# Patient Record
Sex: Female | Born: 1955
Health system: Southern US, Community
[De-identification: ages and names within clinical notes are randomized; demographics above are authoritative.]

## PROBLEM LIST (undated history)

## (undated) DIAGNOSIS — F329 Major depressive disorder, single episode, unspecified: Secondary | ICD-10-CM

## (undated) DIAGNOSIS — Z8619 Personal history of other infectious and parasitic diseases: Secondary | ICD-10-CM

## (undated) DIAGNOSIS — K219 Gastro-esophageal reflux disease without esophagitis: Secondary | ICD-10-CM

## (undated) DIAGNOSIS — B998 Other infectious disease: Secondary | ICD-10-CM

## (undated) DIAGNOSIS — F32A Depression, unspecified: Secondary | ICD-10-CM

## (undated) DIAGNOSIS — F419 Anxiety disorder, unspecified: Secondary | ICD-10-CM

## (undated) DIAGNOSIS — D649 Anemia, unspecified: Secondary | ICD-10-CM

## (undated) DIAGNOSIS — M1990 Unspecified osteoarthritis, unspecified site: Secondary | ICD-10-CM

## (undated) DIAGNOSIS — Z972 Presence of dental prosthetic device (complete) (partial): Secondary | ICD-10-CM

## (undated) DIAGNOSIS — K5090 Crohn's disease, unspecified, without complications: Secondary | ICD-10-CM

## (undated) DIAGNOSIS — I2510 Atherosclerotic heart disease of native coronary artery without angina pectoris: Secondary | ICD-10-CM

## (undated) DIAGNOSIS — I10 Essential (primary) hypertension: Secondary | ICD-10-CM

## (undated) DIAGNOSIS — T8859XA Other complications of anesthesia, initial encounter: Secondary | ICD-10-CM

## (undated) DIAGNOSIS — T4145XA Adverse effect of unspecified anesthetic, initial encounter: Secondary | ICD-10-CM

## (undated) HISTORY — DX: Anxiety disorder, unspecified: F41.9

## (undated) HISTORY — DX: Personal history of other infectious and parasitic diseases: Z86.19

## (undated) HISTORY — DX: Depression, unspecified: F32.A

## (undated) HISTORY — DX: Other infectious disease: B99.8

## (undated) HISTORY — DX: Major depressive disorder, single episode, unspecified: F32.9

---

## 1984-11-18 HISTORY — PX: TUBAL LIGATION: SHX77

## 2001-11-28 ENCOUNTER — Encounter: Payer: Self-pay | Admitting: Emergency Medicine

## 2001-11-28 ENCOUNTER — Emergency Department (HOSPITAL_COMMUNITY): Admission: EM | Admit: 2001-11-28 | Discharge: 2001-11-28 | Payer: Self-pay | Admitting: Emergency Medicine

## 2013-02-16 HISTORY — PX: COLONOSCOPY: SHX174

## 2013-05-03 LAB — HM COLONOSCOPY

## 2013-07-28 HISTORY — PX: OTHER SURGICAL HISTORY: SHX169

## 2013-08-12 DIAGNOSIS — D069 Carcinoma in situ of cervix, unspecified: Secondary | ICD-10-CM | POA: Insufficient documentation

## 2013-08-12 LAB — HM PAP SMEAR: HM PAP: POSITIVE

## 2013-10-22 ENCOUNTER — Ambulatory Visit: Payer: Self-pay | Admitting: Family Medicine

## 2014-01-03 ENCOUNTER — Ambulatory Visit: Payer: Self-pay | Admitting: Obstetrics and Gynecology

## 2014-01-03 LAB — COMPREHENSIVE METABOLIC PANEL
Albumin: 3.2 g/dL — ABNORMAL LOW (ref 3.4–5.0)
Alkaline Phosphatase: 91 U/L
Anion Gap: 1 — ABNORMAL LOW (ref 7–16)
BUN: 10 mg/dL (ref 7–18)
Bilirubin,Total: 0.3 mg/dL (ref 0.2–1.0)
Calcium, Total: 9.2 mg/dL (ref 8.5–10.1)
Chloride: 108 mmol/L — ABNORMAL HIGH (ref 98–107)
Co2: 32 mmol/L (ref 21–32)
Creatinine: 0.93 mg/dL (ref 0.60–1.30)
EGFR (African American): >60
EGFR (Non-African Amer.): >60
Glucose: 82 mg/dL (ref 65–99)
Osmolality: 279 (ref 275–301)
Potassium: 4 mmol/L (ref 3.5–5.1)
SGOT(AST): 30 U/L (ref 15–37)
SGPT (ALT): 18 U/L (ref 12–78)
Sodium: 141 mmol/L (ref 136–145)
Total Protein: 7.1 g/dL (ref 6.4–8.2)

## 2014-01-04 ENCOUNTER — Ambulatory Visit: Payer: Self-pay | Admitting: Obstetrics and Gynecology

## 2014-01-05 LAB — PATHOLOGY REPORT

## 2014-02-16 HISTORY — PX: UPPER GI ENDOSCOPY: SHX6162

## 2014-05-31 LAB — HEPATIC FUNCTION PANEL
ALT: 10 U/L (ref 7–35)
AST: 15 U/L (ref 13–35)

## 2014-05-31 LAB — CBC AND DIFFERENTIAL
HEMATOCRIT: 32 % — AB (ref 36–46)
HEMOGLOBIN: 10.6 g/dL — AB (ref 12.0–16.0)
Platelets: 295 10*3/uL (ref 150–399)
WBC: 6.7 10^3/mL

## 2015-02-02 LAB — BASIC METABOLIC PANEL
BUN: 12 mg/dL (ref 4–21)
Creatinine: 0.8 mg/dL (ref 0.5–1.1)
GLUCOSE: 95 mg/dL
POTASSIUM: 4 mmol/L (ref 3.4–5.3)
SODIUM: 142 mmol/L (ref 137–147)

## 2015-03-11 NOTE — Op Note (Signed)
PATIENT NAME:  Kimberly Hudson, NICKLIN MR#:  [id number] DATE OF BIRTH:  1956/01/02  DATE OF PROCEDURE:  01/04/2014  PREOPERATIVE DIAGNOSIS: Cervical intraepithelial neoplasia grade III.    POSTOPERATIVE DIAGNOSIS: Cervical intraepithelial neoplasia grade III.    OPERATION PERFORMED: LEEP.   ANESTHESIA USED: General.   PRIMARY SURGEON: Dr. Malachy Mood.   ESTIMATED BLOOD LOSS: 15 mL.   OPERATIVE FLUIDS: 850 mL.   URINE OUTPUT: 200 mL of clear urine.   PREOPERATIVE ANTIBIOTICS: None.   DRAINS OR TUBES: None.   IMPLANTS: None.   COMPLICATIONS: None.   SPECIMENS REMOVED: Loop electrocautery excision procedure, ectocervical biopsy, endocervical biopsy and endocervical curettings.   INTRAOPERATIVE FINDINGS: Grossly normal-appearing cervix.   PROCEDURE IN DETAIL: Risks, benefits and alternatives of the procedure were discussed with the patient prior to proceeding to the operating room. The patient was taken to the operating room where she was placed under general anesthesia. The patient was positioned in the dorsal lithotomy position using Allen stirrups, prepped and draped in the usual sterile fashion. A timeout procedure was performed. The patient was straight cath'd for 200 mL of clear urine. An insulated speculum was then placed, visualizing the cervix. Lugol solution was applied to the cervix to demarcate the transformation zone. The cervix was then injected using lidocaine with epinephrine. A loop was then used to remove the ectocervical portion of the LEEP specimen. Following the initial ectocervical specimen, a top hat specimen was obtained using a smaller loop. An ECC was then obtained using the endocervical curette for the top margin of the specimen. Following this, the borders of the LEEP bed were cauterized using a rollerball. Monsel solution was applied. Sponge, needle and instrument counts were correct x 2. The patient tolerated the procedure well and was taken to the recovery room in  stable condition.    ____________________________ Stoney Bang. Georgianne Fick, MD ams:dmm D: 01/04/2014 20:41:21 ET T: 01/04/2014 20:48:58 ET JOB#: [id number]  cc: Stoney Bang. Georgianne Fick, MD, <Dictator> Conan Bowens Madelon Lips MD ELECTRONICALLY SIGNED 01/05/2014 1:01

## 2015-03-21 ENCOUNTER — Other Ambulatory Visit: Payer: Self-pay | Admitting: Urgent Care

## 2015-03-21 DIAGNOSIS — K50918 Crohn's disease, unspecified, with other complication: Secondary | ICD-10-CM

## 2015-03-24 ENCOUNTER — Ambulatory Visit: Admission: RE | Admit: 2015-03-24 | Payer: Self-pay | Source: Ambulatory Visit

## 2015-04-20 ENCOUNTER — Telehealth: Payer: Self-pay | Admitting: *Deleted

## 2015-04-20 NOTE — Telephone Encounter (Signed)
Refill request for Hydro-Ace Last filled by MD on- 04/07/15 #60 x0 refills Last Appt: 04/07/2015 Next Appt: none Please advise refill?

## 2015-04-21 MED ORDER — HYDROCODONE-ACETAMINOPHEN 7.5-325 MG PO TABS: 1.0000 | ORAL_TABLET | Freq: Four times a day (QID) | ORAL | Status: DC | PRN

## 2015-04-21 NOTE — Telephone Encounter (Signed)
Pt called to see if RX was ready. Thanks TNP

## 2015-04-24 ENCOUNTER — Other Ambulatory Visit: Payer: Self-pay | Admitting: Family Medicine

## 2015-04-24 MED ORDER — HYDROCODONE-ACETAMINOPHEN 7.5-325 MG PO TABS: 1.0000 | ORAL_TABLET | Freq: Four times a day (QID) | ORAL | Status: DC | PRN

## 2015-04-24 NOTE — Progress Notes (Signed)
Reprinted rx due to error in address and quantity on previous rx.

## 2015-04-27 ENCOUNTER — Encounter: Payer: Self-pay | Admitting: Emergency Medicine

## 2015-04-27 ENCOUNTER — Emergency Department
Admission: EM | Admit: 2015-04-27 | Discharge: 2015-04-27 | Disposition: A | Discharge status: Home / Self Care | Payer: No Typology Code available for payment source | Attending: Emergency Medicine | Admitting: Emergency Medicine

## 2015-04-27 DIAGNOSIS — Y9289 Other specified places as the place of occurrence of the external cause: Secondary | ICD-10-CM | POA: Insufficient documentation

## 2015-04-27 DIAGNOSIS — Y9389 Activity, other specified: Secondary | ICD-10-CM | POA: Diagnosis not present

## 2015-04-27 DIAGNOSIS — Y998 Other external cause status: Secondary | ICD-10-CM | POA: Insufficient documentation

## 2015-04-27 DIAGNOSIS — Z87891 Personal history of nicotine dependence: Secondary | ICD-10-CM | POA: Insufficient documentation

## 2015-04-27 DIAGNOSIS — X58XXXA Exposure to other specified factors, initial encounter: Secondary | ICD-10-CM | POA: Insufficient documentation

## 2015-04-27 DIAGNOSIS — I10 Essential (primary) hypertension: Secondary | ICD-10-CM | POA: Insufficient documentation

## 2015-04-27 DIAGNOSIS — T18128A Food in esophagus causing other injury, initial encounter: Secondary | ICD-10-CM | POA: Insufficient documentation

## 2015-04-27 DIAGNOSIS — Z79899 Other long term (current) drug therapy: Secondary | ICD-10-CM | POA: Insufficient documentation

## 2015-04-27 DIAGNOSIS — T18108A Unspecified foreign body in esophagus causing other injury, initial encounter: Secondary | ICD-10-CM

## 2015-04-27 HISTORY — DX: Essential (primary) hypertension: I10

## 2015-04-27 HISTORY — DX: Crohn's disease, unspecified, without complications: K50.90

## 2015-04-27 HISTORY — DX: Atherosclerotic heart disease of native coronary artery without angina pectoris: I25.10

## 2015-04-27 NOTE — ED Provider Notes (Signed)
[facility] Emergency Department Provider Note   ____________________________________________  Time seen: 10:40 PM I have reviewed the triage vital signs and the triage nursing note.  HISTORY  Chief Complaint Aspiration   Historian Patient and daughter  HPI Kimberly Hudson is a 59 y.o. female who has a history of esophageal stretching in the past and tonight she had swallowed a piece of sausage and it got stuck in her esophagus. She was having pain at that point in time. EMS did give her glucagon and then the patient was able to finally swallow again on arrival to the emergency department. Symptoms are consistent with moderate but now alleviated. She has followed up once with the PA at Dr.Wohl's office.    Past Medical History  Diagnosis Date  . Crohn's disease   . Hypertension   . Coronary artery disease     There are no active problems to display for this patient.   History reviewed. No pertinent past surgical history.  Current Outpatient Rx  Name  Route  Sig  Dispense  Refill  . azaTHIOprine (IMURAN) 50 MG tablet   Oral   Take 3 mg by mouth daily.         Marland Kitchen HYDROcodone-acetaminophen (NORCO) 7.5-325 MG per tablet   Oral   Take 1 tablet by mouth every 6 (six) hours as needed for moderate pain.   60 tablet   0   . lisinopril-hydrochlorothiazide (PRINZIDE,ZESTORETIC) 20-12.5 MG per tablet   Oral   Take 1 tablet by mouth daily.         . pantoprazole (PROTONIX) 40 MG tablet   Oral   Take 40 mg by mouth daily.         . sertraline (ZOLOFT) 100 MG tablet   Oral   Take 100 mg by mouth daily.           Allergies Sulfa antibiotics  History reviewed. No pertinent family history.  Social History History  Substance Use Topics  . Smoking status: Former Research scientist (life sciences)  . Smokeless tobacco: Not on file  . Alcohol Use: Yes     Comment: every once in a while    Review of Systems  Constitutional:  Eyes: Negative for visual  changes. ENT: Negative for sore throat. Cardiovascular: Negative for chest pain. Respiratory: Negative for shortness of breath. Gastrointestinal: Negative for abdominal pain, vomiting and diarrhea. Genitourinary:no urinary problems Musculoskeletal:negative for extremity pain. Skin: Negative for rash. Neurological:negative for alteredmental status   ____________________________________________   PHYSICAL EXAM:  VITAL SIGNS: ED Triage Vitals  Enc Vitals Group     BP 04/27/15 2137 118/49 mmHg     Pulse Rate 04/27/15 2137 66     Resp --      Temp 04/27/15 2137 98.3 F (36.8 C)     Temp Source 04/27/15 2137 Oral     SpO2 04/27/15 2137 97 %     Weight 04/27/15 2137 192 lb 4 oz (87.204 kg)     Height 04/27/15 2137 5' 7"  (1.702 m)     Head Cir --      Peak Flow --      Pain Score --      Pain Loc --      Pain Edu? --      Excl. in [place]? --     Constitutional: Alert and oriented. Well appearing and in no distress. Eyes: Conjunctivae are normal. PERRL. Normal extraocular movements. ENT   Head: Normocephalic and atraumatic.   Nose: No congestion/rhinnorhea.  Mouth/Throat: Mucous membranes are moist.   Neck: No stridor. Cardiovascular: Normal rate, regular rhythm.  No murmurs, rubs, or gallops. Respiratory: Normal respiratory effort without tachypnea nor retractions. Breath sounds are clear and equal bilaterally. No wheezes/rales/rhonchi. Gastrointestinal: Soft. No distention, no guarding, no rebound.nontender Genitourinary/rectal:deferred Musculoskeletal: nontender movement in 4 extremities Neurologic:  Normal speech and language. No gross focal neurologic deficits are appreciated. Skin:  Skin is warm, dry and intact. No rash noted. Psychiatric: Mood and affect are normal. Speech and behavior are normal. Patient exhibits appropriate insight and judgment.  ____________________________________________    EKG  none ____________________________________________  LABS (pertinent positives/negatives)  none  ____________________________________________  RADIOLOGY Radiologist results reviewed  none __________________________________________  PROCEDURES  Procedure(s) performed: None Critical Care performed: None  ____________________________________________   ED COURSE / ASSESSMENT AND PLAN  Pertinent labs & imaging results that were available during my care of the patient were reviewed by me and considered in my medical decision making (see chart for details).   Patient's esophageal foreign body is now alleviated and it passed after glucagon given prehospital.   ___________________________________________   FINAL CLINICAL IMPRESSION(S) / ED DIAGNOSES   Final diagnoses:  Esophageal foreign body, initial encounter      Lisa Roca, MD 04/27/15 2249

## 2015-04-27 NOTE — ED Notes (Signed)
Pt arrived via EMS from home. Pt had difficulties swallowing "sausage dog". Pt has hx of getting her esophagus stretched. EMS gave glucagon and 12 min later patient was able to swallow food. Pt alert and oriented on arrival.

## 2015-04-27 NOTE — ED Notes (Signed)
Done at 2145

## 2015-04-27 NOTE — Discharge Instructions (Signed)
You were evaluated after having food stuck in your esophagus which did go down after the glucagon given by EMS. Return to the emergency room for any new or worsening condition including pain, trouble breathing, or additional food stuck in esophagus.

## 2015-05-05 ENCOUNTER — Other Ambulatory Visit: Payer: Self-pay | Admitting: *Deleted

## 2015-05-05 MED ORDER — HYDROCODONE-ACETAMINOPHEN 7.5-325 MG PO TABS: 1.0000 | ORAL_TABLET | Freq: Four times a day (QID) | ORAL | Status: DC | PRN

## 2015-05-05 NOTE — Telephone Encounter (Signed)
Refill request for Hydrocondon-Ace 7.5/325 mg Last filled by MD on- 04/24/2015 #60 x0 Last Appt: 04/07/2015 Next Appt: none Please advise refill?

## 2015-05-24 ENCOUNTER — Other Ambulatory Visit: Payer: Self-pay | Admitting: *Deleted

## 2015-05-24 MED ORDER — HYDROCODONE-ACETAMINOPHEN 7.5-325 MG PO TABS: 1.0000 | ORAL_TABLET | Freq: Four times a day (QID) | ORAL | Status: DC | PRN

## 2015-05-29 ENCOUNTER — Telehealth: Payer: Self-pay | Admitting: Urgent Care

## 2015-05-29 NOTE — Telephone Encounter (Signed)
Patient called in and states in she was a no show for her small bowel follow through in May. She is now having problems choking and wants to make an appointment to see the doctor.

## 2015-05-31 ENCOUNTER — Telehealth: Payer: Self-pay | Admitting: Gastroenterology

## 2015-05-31 NOTE — Telephone Encounter (Signed)
I have called pt to reschedule appt on 06/13/15 with Dr Allen Norris. Dr Allen Norris will be doing procedures in [place] that day. Left a VM. Will try back.

## 2015-06-08 ENCOUNTER — Other Ambulatory Visit: Payer: Self-pay | Admitting: *Deleted

## 2015-06-08 MED ORDER — HYDROCODONE-ACETAMINOPHEN 7.5-325 MG PO TABS: 1.0000 | ORAL_TABLET | Freq: Four times a day (QID) | ORAL | Status: DC | PRN

## 2015-06-13 ENCOUNTER — Ambulatory Visit: Payer: PRIVATE HEALTH INSURANCE | Admitting: Gastroenterology

## 2015-06-19 ENCOUNTER — Other Ambulatory Visit: Payer: Self-pay

## 2015-06-19 DIAGNOSIS — K5090 Crohn's disease, unspecified, without complications: Secondary | ICD-10-CM | POA: Insufficient documentation

## 2015-06-19 DIAGNOSIS — D849 Immunodeficiency, unspecified: Secondary | ICD-10-CM

## 2015-06-19 DIAGNOSIS — B998 Other infectious disease: Secondary | ICD-10-CM | POA: Insufficient documentation

## 2015-06-19 HISTORY — DX: Immunodeficiency, unspecified: D84.9

## 2015-06-19 HISTORY — DX: Other infectious disease: B99.8

## 2015-06-20 ENCOUNTER — Ambulatory Visit (INDEPENDENT_AMBULATORY_CARE_PROVIDER_SITE_OTHER): Payer: PRIVATE HEALTH INSURANCE | Admitting: Gastroenterology

## 2015-06-20 ENCOUNTER — Encounter: Payer: Self-pay | Admitting: Gastroenterology

## 2015-06-20 VITALS — BP 107/68 | HR 77 | Temp 98.6°F | Ht 67.0 in | Wt 185.0 lb

## 2015-06-20 DIAGNOSIS — R1314 Dysphagia, pharyngoesophageal phase: Secondary | ICD-10-CM | POA: Diagnosis not present

## 2015-06-20 NOTE — Progress Notes (Signed)
Primary Care Physician: Lelon Huh, MD  Primary Gastroenterologist:  Dr. Lucilla Lame  Chief Complaint  Patient presents with  . Follow-up    Having difficulty swallowing    HPI: Kimberly Hudson is a 59 y.o. female here female here follow-up of dysphagia. This patient comes today with a report of dysphagia. The patient was seen some time ago by minors practitioner who had set her up for imaging but the patient never went for it. The patient has had endoscopies the past with dilation of her esophagus for narrowing. She says she has had multiple EGD for this. Now she reports that she has episodes of food getting stuck in her esophagus. One time in the recent past she actually had last so long that did not pass until she got to the emergency room.  Current Outpatient Prescriptions  Medication Sig Dispense Refill  . azaTHIOprine (IMURAN) 50 MG tablet Take 3 mg by mouth daily.    Marland Kitchen HYDROcodone-acetaminophen (NORCO) 7.5-325 MG per tablet Take 1 tablet by mouth every 6 (six) hours as needed for moderate pain. 60 tablet 0  . lisinopril-hydrochlorothiazide (PRINZIDE,ZESTORETIC) 20-12.5 MG per tablet Take 1 tablet by mouth daily.    . pantoprazole (PROTONIX) 40 MG tablet Take 40 mg by mouth daily.    . pravastatin (PRAVACHOL) 10 MG tablet Take by mouth.    . sertraline (ZOLOFT) 100 MG tablet Take 100 mg by mouth daily.     No current facility-administered medications for this visit.    Allergies as of 06/20/2015 - Review Complete 06/20/2015  Allergen Reaction Noted  . Nsaids Other (See Comments) 06/19/2015  . Sulfa antibiotics Rash 04/27/2015    ROS:  General: Negative for anorexia, weight loss, fever, chills, fatigue, weakness. ENT: Negative for hoarseness, difficulty swallowing , nasal congestion. CV: Negative for chest pain, angina, palpitations, dyspnea on exertion, peripheral edema.  Respiratory: Negative for dyspnea at rest, dyspnea on exertion, cough, sputum, wheezing.  GI: See  history of present illness. GU:  Negative for dysuria, hematuria, urinary incontinence, urinary frequency, nocturnal urination.  Endo: Negative for unusual weight change.    Physical Examination:   BP 107/68 mmHg  Pulse 77  Temp(Src) 98.6 F (37 C) (Oral)  Ht 5' 7"  (1.702 m)  Wt 185 lb (83.915 kg)  BMI 28.97 kg/m2  General: Well-nourished, well-developed in no acute distress.  Eyes: No icterus. Conjunctivae pink. Mouth: Oropharyngeal mucosa moist and pink , no lesions erythema or exudate. Lungs: Clear to auscultation bilaterally. Non-labored. Heart: Regular rate and rhythm, no murmurs rubs or gallops.  Abdomen: Bowel sounds are normal, nontender, nondistended, no hepatosplenomegaly or masses, no abdominal bruits or hernia , no rebound or guarding.   Extremities: No lower extremity edema. No clubbing or deformities. Neuro: Alert and oriented x 3.  Grossly intact. Skin: Warm and dry, no jaundice.   Psych: Alert and cooperative, normal mood and affect.  Labs:    Imaging Studies: No results found.  Assessment and Plan:   Kimberly Hudson is a 59 y.o. y/o female who comes today with a history of recurrent dysphagia and has had dilation of her esophagus multiple times in the past. The patient will be set up for an EGD to evaluate her esophagus for possible stricture and for possible dilation.I have discussed risks & benefits which include, but are not limited to, bleeding, infection, perforation & drug reaction. The patient agrees with this plan & written consent will be obtained.     Note: This dictation was prepared  with Dragon dictation along with smaller phrase technology. Any transcriptional errors that result from this process are unintentional.

## 2015-06-21 ENCOUNTER — Encounter: Payer: Self-pay | Admitting: *Deleted

## 2015-06-21 ENCOUNTER — Other Ambulatory Visit: Payer: Self-pay | Admitting: *Deleted

## 2015-06-22 MED ORDER — HYDROCODONE-ACETAMINOPHEN 7.5-325 MG PO TABS: 1.0000 | ORAL_TABLET | Freq: Four times a day (QID) | ORAL | Status: DC | PRN

## 2015-06-26 NOTE — Discharge Instructions (Signed)
General Anesthesia, Care After Refer to this sheet in the next few weeks. These instructions provide you with information on caring for yourself after your procedure. Your health care provider may also give you more specific instructions. Your treatment has been planned according to current medical practices, but problems sometimes occur. Call your health care provider if you have any problems or questions after your procedure. WHAT TO EXPECT AFTER THE PROCEDURE After the procedure, it is typical to experience:  Sleepiness.  Nausea and vomiting. HOME CARE INSTRUCTIONS  For the first 24 hours after general anesthesia:  Have a responsible person with you.  Do not drive a car. If you are alone, do not take public transportation.  Do not drink alcohol.  Do not take medicine that has not been prescribed by your health care provider.  Do not sign important papers or make important decisions.  You may resume a normal diet and activities as directed by your health care provider.  Change bandages (dressings) as directed.  If you have questions or problems that seem related to general anesthesia, call the hospital and ask for the anesthetist or anesthesiologist on call. SEEK MEDICAL CARE IF:  You have nausea and vomiting that continue the day after anesthesia.  You develop a rash. SEEK IMMEDIATE MEDICAL CARE IF:   You have difficulty breathing.  You have chest pain.  You have any allergic problems. Document Released: 02/10/2001 Document Revised: 11/09/2013 Document Reviewed: 05/20/2013 [facility] Patient Information 2015 [place], [place]. This information is not intended to replace advice given to you by your health care provider. Make sure you discuss any questions you have with your health care provider.

## 2015-06-27 ENCOUNTER — Other Ambulatory Visit: Payer: Self-pay | Admitting: *Deleted

## 2015-06-28 ENCOUNTER — Ambulatory Visit
Admission: RE | Admit: 2015-06-28 | Discharge: 2015-06-28 | Disposition: A | Discharge status: Home / Self Care | Payer: No Typology Code available for payment source | Source: Ambulatory Visit | Attending: Gastroenterology | Admitting: Gastroenterology

## 2015-06-28 ENCOUNTER — Other Ambulatory Visit: Payer: Self-pay | Admitting: Gastroenterology

## 2015-06-28 ENCOUNTER — Ambulatory Visit: Payer: No Typology Code available for payment source | Admitting: Anesthesiology

## 2015-06-28 ENCOUNTER — Encounter
Admission: RE | Disposition: A | Discharge status: Home / Self Care | Payer: Self-pay | Source: Ambulatory Visit | Attending: Gastroenterology

## 2015-06-28 DIAGNOSIS — R1310 Dysphagia, unspecified: Secondary | ICD-10-CM | POA: Diagnosis not present

## 2015-06-28 DIAGNOSIS — Z886 Allergy status to analgesic agent status: Secondary | ICD-10-CM | POA: Insufficient documentation

## 2015-06-28 DIAGNOSIS — M170 Bilateral primary osteoarthritis of knee: Secondary | ICD-10-CM | POA: Diagnosis not present

## 2015-06-28 DIAGNOSIS — I10 Essential (primary) hypertension: Secondary | ICD-10-CM | POA: Diagnosis not present

## 2015-06-28 DIAGNOSIS — Z79899 Other long term (current) drug therapy: Secondary | ICD-10-CM | POA: Insufficient documentation

## 2015-06-28 DIAGNOSIS — I2510 Atherosclerotic heart disease of native coronary artery without angina pectoris: Secondary | ICD-10-CM | POA: Diagnosis not present

## 2015-06-28 DIAGNOSIS — Z882 Allergy status to sulfonamides status: Secondary | ICD-10-CM | POA: Insufficient documentation

## 2015-06-28 DIAGNOSIS — M19011 Primary osteoarthritis, right shoulder: Secondary | ICD-10-CM | POA: Diagnosis not present

## 2015-06-28 DIAGNOSIS — M19012 Primary osteoarthritis, left shoulder: Secondary | ICD-10-CM | POA: Insufficient documentation

## 2015-06-28 DIAGNOSIS — B3781 Candidal esophagitis: Secondary | ICD-10-CM | POA: Insufficient documentation

## 2015-06-28 DIAGNOSIS — M160 Bilateral primary osteoarthritis of hip: Secondary | ICD-10-CM | POA: Diagnosis not present

## 2015-06-28 DIAGNOSIS — Z87891 Personal history of nicotine dependence: Secondary | ICD-10-CM | POA: Diagnosis not present

## 2015-06-28 DIAGNOSIS — K2970 Gastritis, unspecified, without bleeding: Secondary | ICD-10-CM | POA: Diagnosis not present

## 2015-06-28 DIAGNOSIS — K222 Esophageal obstruction: Secondary | ICD-10-CM | POA: Insufficient documentation

## 2015-06-28 HISTORY — DX: Other complications of anesthesia, initial encounter: T88.59XA

## 2015-06-28 HISTORY — DX: Unspecified osteoarthritis, unspecified site: M19.90

## 2015-06-28 HISTORY — DX: Anemia, unspecified: D64.9

## 2015-06-28 HISTORY — PX: ESOPHAGOGASTRODUODENOSCOPY (EGD) WITH PROPOFOL: SHX5813

## 2015-06-28 HISTORY — DX: Presence of dental prosthetic device (complete) (partial): Z97.2

## 2015-06-28 HISTORY — DX: Adverse effect of unspecified anesthetic, initial encounter: T41.45XA

## 2015-06-28 SURGERY — ESOPHAGOGASTRODUODENOSCOPY (EGD) WITH PROPOFOL
Anesthesia: Monitor Anesthesia Care | Wound class: Clean Contaminated

## 2015-06-28 MED ORDER — LACTATED RINGERS IV SOLN
INTRAVENOUS | Status: DC | PRN
  Administered 2015-06-28: 08:00:00 via INTRAVENOUS

## 2015-06-28 MED ORDER — PROMETHAZINE HCL 25 MG PO TABS: 25.0000 mg | ORAL_TABLET | Freq: Four times a day (QID) | ORAL | Status: DC | PRN

## 2015-06-28 MED ORDER — LIDOCAINE HCL (CARDIAC) 20 MG/ML IV SOLN
INTRAVENOUS | Status: DC | PRN
Start: 2015-06-28 — End: 2015-06-28
  Administered 2015-06-28: 30 mg via INTRAVENOUS

## 2015-06-28 MED ORDER — SODIUM CHLORIDE 0.9 % IV SOLN: INTRAVENOUS | Status: DC

## 2015-06-28 MED ORDER — ONDANSETRON HCL 4 MG PO TABS
4.0000 mg | ORAL_TABLET | Freq: Once | ORAL | Status: AC
  Administered 2015-06-28: 4 mg via ORAL

## 2015-06-28 MED ORDER — PROPOFOL 10 MG/ML IV BOLUS
INTRAVENOUS | Status: DC | PRN
  Administered 2015-06-28 (×5): 20 mg via INTRAVENOUS

## 2015-06-28 SURGICAL SUPPLY — 39 items
BALLN DILATOR 10-12 8 (BALLOONS) ×1
BALLN DILATOR 12-15 8 (BALLOONS)
BALLN DILATOR 15-18 8 (BALLOONS)
BALLN DILATOR CRE 0-12 8 (BALLOONS) ×1
BALLN DILATOR ESOPH 8 10 CRE (MISCELLANEOUS) IMPLANT
BALLOON DILATOR 12-15 8 (BALLOONS) IMPLANT
BALLOON DILATOR 15-18 8 (BALLOONS) IMPLANT
BALLOON DILATOR CRE 0-12 8 (BALLOONS) IMPLANT
BLOCK BITE 60FR ADLT L/F GRN (MISCELLANEOUS) ×2 IMPLANT
CANISTER SUCT 1200ML W/VALVE (MISCELLANEOUS) ×2 IMPLANT
FCP ESCP3.2XJMB 240X2.8X (MISCELLANEOUS)
FORCEPS BIOP RAD 4 LRG CAP 4 (CUTTING FORCEPS) IMPLANT
FORCEPS BIOP RJ4 240 W/NDL (MISCELLANEOUS)
FORCEPS ESCP3.2XJMB 240X2.8X (MISCELLANEOUS) IMPLANT
GOWN CVR UNV OPN BCK APRN NK (MISCELLANEOUS) ×2 IMPLANT
GOWN ISOL THUMB LOOP REG UNIV (MISCELLANEOUS) ×4
HEMOCLIP INSTINCT (CLIP) IMPLANT
INJECTOR VARIJECT VIN23 (MISCELLANEOUS) IMPLANT
KIT CO2 TUBING (TUBING) IMPLANT
KIT DEFENDO VALVE AND CONN (KITS) IMPLANT
KIT ENDO PROCEDURE OLY (KITS) ×2 IMPLANT
LIGATOR MULTIBAND 6SHOOTER MBL (MISCELLANEOUS) IMPLANT
MARKER SPOT ENDO TATTOO 5ML (MISCELLANEOUS) IMPLANT
PAD GROUND ADULT SPLIT (MISCELLANEOUS) IMPLANT
SNARE SHORT THROW 13M SML OVAL (MISCELLANEOUS) IMPLANT
SNARE SHORT THROW 30M LRG OVAL (MISCELLANEOUS) IMPLANT
SPOT EX ENDOSCOPIC TATTOO (MISCELLANEOUS)
SUCTION POLY TRAP 4CHAMBER (MISCELLANEOUS) IMPLANT
SYR INFLATION 60ML (SYRINGE) ×1 IMPLANT
TRAP SUCTION POLY (MISCELLANEOUS) IMPLANT
TUBING CONN 6MMX3.1M (TUBING)
TUBING SUCTION CONN 0.25 STRL (TUBING) IMPLANT
UNDERPAD 30X60 958B10 (PK) (MISCELLANEOUS) IMPLANT
VALVE BIOPSY ENDO (VALVE) IMPLANT
VARIJECT INJECTOR VIN23 (MISCELLANEOUS)
WATER AUXILLARY (MISCELLANEOUS) IMPLANT
WATER STERILE IRR 250ML POUR (IV SOLUTION) ×2 IMPLANT
WATER STERILE IRR 500ML POUR (IV SOLUTION) IMPLANT
WIRE CRE 18-20MM 8CM F G (MISCELLANEOUS) IMPLANT

## 2015-06-28 NOTE — H&P (Signed)
[facility]  [address] [place], [place] [address] Phone: [phone] Fax : [phone]  Primary Care Physician:  Lelon Huh, MD Primary Gastroenterologist:  Dr. Allen Norris  Pre-Procedure History & Physical: HPI:  Kimberly Hudson is a 59 y.o. female is here for an endoscopy.   Past Medical History  Diagnosis Date  . Crohn's disease   . Hypertension   . Coronary artery disease   . 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 Complication of anesthesia     propofol causes headaches  . Arthritis     osteo - shoulders, hips, knees  . Wears dentures     full upper  . Anemia     past hx    Past Surgical History  Procedure Laterality Date  . Upper gi endoscopy  02/2014  . Colonoscopy  02/2013    No polyps were found    Prior to Admission medications   Medication Sig Start Date End Date Taking? Authorizing Provider  azaTHIOprine (IMURAN) 50 MG tablet Take 3 mg by mouth daily.   Yes Historical Provider, MD  diphenoxylate-atropine (LOMOTIL) 2.5-0.025 MG per tablet Take by mouth 4 (four) times daily as needed for diarrhea or loose stools.   Yes Historical Provider, MD  HYDROcodone-acetaminophen (NORCO) 7.5-325 MG per tablet Take 1 tablet by mouth every 6 (six) hours as needed for moderate pain. 06/22/15  Yes Birdie Sons, MD  lisinopril-hydrochlorothiazide (PRINZIDE,ZESTORETIC) 20-12.5 MG per tablet Take 1 tablet by mouth daily.   Yes Historical Provider, MD  pantoprazole (PROTONIX) 40 MG tablet Take 40 mg by mouth daily.   Yes Historical Provider, MD  promethazine (PHENERGAN) 25 MG tablet Take 25 mg by mouth every 6 (six) hours as needed for nausea or vomiting.   Yes Historical Provider, MD  sertraline (ZOLOFT) 100 MG tablet Take 100 mg by mouth daily.   Yes Historical Provider, MD  pravastatin (PRAVACHOL) 10 MG tablet Take by mouth.    Historical Provider, MD    Allergies as of 06/20/2015 - Review Complete 06/20/2015  Allergen Reaction Noted    . Nsaids Other (See Comments) 06/19/2015  . Sulfa antibiotics Rash 04/27/2015    Family History  Problem Relation Age of Onset  . COPD Mother   . Heart disease Mother   . COPD Father   . COPD Sister   . Heart disease Sister     Social History   Social History  . Marital Status: Widowed    Spouse Name: N/A  . Number of Children: N/A  . Years of Education: N/A   Occupational History  . Not on file.   Social History Main Topics  . Smoking status: Former Smoker    Quit date: 11/18/2012  . Smokeless tobacco: Not on file  . Alcohol Use: Yes     Comment: every once in a while  . Drug Use: No  . Sexual Activity: Not Currently   Other Topics Concern  . Not on file   Social History Narrative    Review of Systems: See HPI, otherwise negative ROS  Physical Exam: Ht 5' 7"  (1.702 m)  Wt 185 lb (83.915 kg)  BMI 28.97 kg/m2 General:   Alert,  pleasant and cooperative in NAD Head:  Normocephalic and atraumatic. Neck:  Supple; no masses or thyromegaly. Lungs:  Clear throughout to auscultation.    Heart:  Regular rate and rhythm. Abdomen:  Soft, nontender and nondistended. Normal bowel sounds, without guarding, and without rebound.   Neurologic:  Alert and  oriented x4;  grossly normal neurologically.  Impression/Plan: Kimberly Hudson is here for an endoscopy to be performed for dysphgia  Risks, benefits, limitations, and alternatives regarding  endoscopy have been reviewed with the patient.  Questions have been answered.  All parties agreeable.   Ollen Bowl, MD  06/28/2015, 7:15 AM

## 2015-06-28 NOTE — Anesthesia Preprocedure Evaluation (Signed)
Anesthesia Evaluation   Patient awake    Reviewed: Allergy & Precautions, H&P , NPO status , Patient's Chart, lab work & pertinent test results  History of Anesthesia Complications (+) history of anesthetic complications (propofol is thought to give her a headache (after previous EGD).  No other reactions and HA went away later that day.)  Airway Mallampati: II  TM Distance: >3 FB Neck ROM: full    Dental  (+) Edentulous Upper   Pulmonary former smoker,    Pulmonary exam normal       Cardiovascular hypertension, Normal cardiovascular exam    Neuro/Psych    GI/Hepatic negative GI ROS, Neg liver ROS,   Endo/Other    Renal/GU negative Renal ROS     Musculoskeletal   Abdominal   Peds  Hematology negative hematology ROS (+)   Anesthesia Other Findings   Reproductive/Obstetrics                             Anesthesia Physical Anesthesia Plan  ASA: II  Anesthesia Plan: MAC   Post-op Pain Management:    Induction:   Airway Management Planned:   Additional Equipment:   Intra-op Plan:   Post-operative Plan:   Informed Consent: I have reviewed the patients History and Physical, chart, labs and discussed the procedure including the risks, benefits and alternatives for the proposed anesthesia with the patient or authorized representative who has indicated his/her understanding and acceptance.     Plan Discussed with: CRNA  Anesthesia Plan Comments:         Anesthesia Quick Evaluation

## 2015-06-28 NOTE — Op Note (Signed)
[facility] Patient Name: Kimberly Hudson Procedure Date: 06/28/2015 7:44 AM MRN: [id number] Account #: [id number] Date of Birth: 03-19-1956 Admit Type: Outpatient Age: 59 Room: [facility] OR ROOM 01 Gender: Female Note Status: Finalized Procedure:         Upper GI endoscopy Indications:       Dysphagia Providers:         Lucilla Lame, MD Referring MD:      Kirstie Peri. Caryn Section, MD (Referring MD) Medicines:         Propofol per Anesthesia Complications:     No immediate complications. Procedure:         Pre-Anesthesia Assessment:                    - Prior to the procedure, a History and Physical was                     performed, and patient medications and allergies were                     reviewed. The patient's tolerance of previous anesthesia                     was also reviewed. The risks and benefits of the procedure                     and the sedation options and risks were discussed with the                     patient. All questions were answered, and informed consent                     was obtained. Prior Anticoagulants: The patient has taken                     no previous anticoagulant or antiplatelet agents. ASA                     Grade Assessment: II - A patient with mild systemic                     disease. After reviewing the risks and benefits, the                     patient was deemed in satisfactory condition to undergo                     the procedure.                    After obtaining informed consent, the endoscope was passed                     under direct vision. Throughout the procedure, the                     patient's blood pressure, pulse, and oxygen saturations                     were monitored continuously. The Olympus GIF H180J                     endoscope (S#: [id number]) was introduced through the mouth,  and advanced to the second part of duodenum. The upper GI                     endoscopy was  accomplished without difficulty. The patient                     tolerated the procedure well. Findings:      Diffuse candidiasis was found in the entire esophagus. Brushings were       obtained in the entire esophagus for KOH prep.      A benign-appearing, intrinsic severe stenosis measuring 1 cm (inner       diameter) was found in the entire esophagus and was traversed after       dilation. A TTS dilator was passed through the scope. Dilation with a       08-29-11 mm balloon (to a maximum balloon size of 12 mm) dilator was       performed. Biopsies were taken with a cold forceps for histology.      Localized mild inflammation characterized by erythema was found in the       gastric antrum. Biopsies were taken with a cold forceps for histology.      The examined duodenum was normal. Impression:        - Monilial esophagitis.                    - Benign-appearing esophageal stricture. Dilated. Biopsied.                    - Gastritis. Biopsied.                    - Normal examined duodenum. Recommendation:    - Await pathology results. Procedure Code(s): --- Professional ---                    [phone], Esophagogastroduodenoscopy, flexible, transoral;                     with transendoscopic balloon dilation of esophagus (less                     than 30 mm diameter)                    43239, Esophagogastroduodenoscopy, flexible, transoral;                     with biopsy, single or multiple Diagnosis Code(s): --- Professional ---                    R13.10, Dysphagia, unspecified                    B37.81, Candidal esophagitis                    K22.2, Esophageal obstruction                    K29.70, Gastritis, unspecified, without bleeding CPT copyright 2014 American Medical Association. All rights reserved. The codes documented in this report are preliminary and upon coder review may  be revised to meet current compliance requirements. Lucilla Lame, MD 06/28/2015 8:07:42 AM This  report has been signed electronically. Number of Addenda: 0 Note Initiated On: 06/28/2015 7:44 AM Total Procedure Duration: 0 hours 8 minutes 50 seconds       [place]  [facility]

## 2015-06-28 NOTE — Anesthesia Postprocedure Evaluation (Signed)
  Anesthesia Post-op Note  Patient: Kimberly Hudson  Procedure(s) Performed: Procedure(s) with comments: ESOPHAGOGASTRODUODENOSCOPY (EGD) WITH PROPOFOL (N/A) - with dialation  Anesthesia type:MAC  Patient location: PACU  Post pain: Pain level controlled  Post assessment: Post-op Vital signs reviewed, Patient's Cardiovascular Status Stable, Respiratory Function Stable, Patent Airway and No signs of Nausea or vomiting  Post vital signs: Reviewed and stable  Last Vitals:  Filed Vitals:   06/28/15 0826  BP: 116/72  Pulse: 70  Temp:   Resp: 13    Level of consciousness: awake, alert  and patient cooperative  Complications: No apparent anesthesia complications

## 2015-06-28 NOTE — Transfer of Care (Signed)
Immediate Anesthesia Transfer of Care Note  Patient: Kimberly Hudson  Procedure(s) Performed: Procedure(s) with comments: ESOPHAGOGASTRODUODENOSCOPY (EGD) WITH PROPOFOL (N/A) - with dialation  Patient Location: PACU  Anesthesia Type: MAC  Level of Consciousness: awake, alert  and patient cooperative  Airway and Oxygen Therapy: Patient Spontanous Breathing and Patient connected to supplemental oxygen  Post-op Assessment: Post-op Vital signs reviewed, Patient's Cardiovascular Status Stable, Respiratory Function Stable, Patent Airway and No signs of Nausea or vomiting  Post-op Vital Signs: Reviewed and stable  Complications: No apparent anesthesia complications

## 2015-06-28 NOTE — Telephone Encounter (Signed)
Please advise refill for phenergan 25 mg.

## 2015-06-29 ENCOUNTER — Encounter: Payer: Self-pay | Admitting: Gastroenterology

## 2015-06-29 ENCOUNTER — Other Ambulatory Visit: Payer: Self-pay

## 2015-07-06 ENCOUNTER — Other Ambulatory Visit: Payer: Self-pay | Admitting: *Deleted

## 2015-07-06 MED ORDER — HYDROCODONE-ACETAMINOPHEN 7.5-325 MG PO TABS: 1.0000 | ORAL_TABLET | Freq: Four times a day (QID) | ORAL | Status: DC | PRN

## 2015-07-06 NOTE — Telephone Encounter (Signed)
E-mail request for hydrocodone-acetaminohen 7.5-325 mg 1 tablet q6hrs prn.

## 2015-07-10 ENCOUNTER — Telehealth: Payer: Self-pay | Admitting: Gastroenterology

## 2015-07-10 NOTE — Telephone Encounter (Signed)
Please advise 

## 2015-07-10 NOTE — Telephone Encounter (Signed)
This is a new patient of Dr Allen Norris. She came from another GI practice. SHe is needing a refill on IMURAN. Her old GI doctor used to prescribe this for her, but now she is a patient of Dr Allen Norris and would like him to prescribe this for her. Please call and advise.

## 2015-07-11 ENCOUNTER — Telehealth: Payer: Self-pay

## 2015-07-11 NOTE — Telephone Encounter (Signed)
-----   Message from Lucilla Lame, MD sent at 07/10/2015  8:16 PM EDT ----- The esophagus showed inflammation and a yeast infection. The patient needs to be started on 174m of diflucan for 14 days if he has not been given it already.

## 2015-07-11 NOTE — Telephone Encounter (Signed)
Ginger, patient returned your call for pathology results. Please call her on her home phone at [phone]

## 2015-07-11 NOTE — Telephone Encounter (Signed)
LVM for pt to return my call.

## 2015-07-11 NOTE — Telephone Encounter (Signed)
-----   Message from Lucilla Lame, MD sent at 07/10/2015  8:16 PM EDT ----- The esophagus showed inflammation and a yeast infection. The patient needs to be started on 114m of diflucan for 14 days if he has not been given it already.

## 2015-07-11 NOTE — Telephone Encounter (Signed)
Please find dose there dose and refill. Should also have CBC every 1-2 months.

## 2015-07-12 ENCOUNTER — Other Ambulatory Visit: Payer: Self-pay

## 2015-07-12 DIAGNOSIS — B3781 Candidal esophagitis: Secondary | ICD-10-CM

## 2015-07-12 DIAGNOSIS — K50919 Crohn's disease, unspecified, with unspecified complications: Secondary | ICD-10-CM

## 2015-07-12 MED ORDER — FLUCONAZOLE 100 MG PO TABS: 100.0000 mg | ORAL_TABLET | Freq: Every day | ORAL | Status: DC

## 2015-07-12 MED ORDER — AZATHIOPRINE 50 MG PO TABS: 150.0000 mg | ORAL_TABLET | Freq: Every day | ORAL | Status: DC

## 2015-07-12 NOTE — Telephone Encounter (Signed)
-----   Message from Lucilla Lame, MD sent at 07/10/2015  8:16 PM EDT ----- The esophagus showed inflammation and a yeast infection. The patient needs to be started on 131m of diflucan for 14 days if he has not been given it already.

## 2015-07-12 NOTE — Telephone Encounter (Signed)
Pt notified of results. Rx for Diflucan sent to [facility], [address]. Per pt request.

## 2015-07-17 ENCOUNTER — Encounter: Payer: Self-pay | Admitting: *Deleted

## 2015-07-20 ENCOUNTER — Other Ambulatory Visit: Payer: Self-pay | Admitting: *Deleted

## 2015-07-21 MED ORDER — HYDROCODONE-ACETAMINOPHEN 7.5-325 MG PO TABS: 1.0000 | ORAL_TABLET | Freq: Four times a day (QID) | ORAL | Status: DC | PRN

## 2015-07-25 NOTE — Discharge Instructions (Signed)
General Anesthesia, Care After Refer to this sheet in the next few weeks. These instructions provide you with information on caring for yourself after your procedure. Your health care provider may also give you more specific instructions. Your treatment has been planned according to current medical practices, but problems sometimes occur. Call your health care provider if you have any problems or questions after your procedure. WHAT TO EXPECT AFTER THE PROCEDURE After the procedure, it is typical to experience:  Sleepiness.  Nausea and vomiting. HOME CARE INSTRUCTIONS  For the first 24 hours after general anesthesia:  Have a responsible person with you.  Do not drive a car. If you are alone, do not take public transportation.  Do not drink alcohol.  Do not take medicine that has not been prescribed by your health care provider.  Do not sign important papers or make important decisions.  You may resume a normal diet and activities as directed by your health care provider.  Change bandages (dressings) as directed.  If you have questions or problems that seem related to general anesthesia, call the hospital and ask for the anesthetist or anesthesiologist on call. SEEK MEDICAL CARE IF:  You have nausea and vomiting that continue the day after anesthesia.  You develop a rash. SEEK IMMEDIATE MEDICAL CARE IF:   You have difficulty breathing.  You have chest pain.  You have any allergic problems. Document Released: 02/10/2001 Document Revised: 11/09/2013 Document Reviewed: 05/20/2013 [facility] Patient Information 2015 [place], [place]. This information is not intended to replace advice given to you by your health care provider. Make sure you discuss any questions you have with your health care provider.

## 2015-07-26 ENCOUNTER — Ambulatory Visit: Payer: No Typology Code available for payment source | Admitting: Anesthesiology

## 2015-07-26 ENCOUNTER — Other Ambulatory Visit: Payer: Self-pay | Admitting: Gastroenterology

## 2015-07-26 ENCOUNTER — Ambulatory Visit
Admission: RE | Admit: 2015-07-26 | Discharge: 2015-07-26 | Disposition: A | Discharge status: Home / Self Care | Payer: No Typology Code available for payment source | Source: Ambulatory Visit | Attending: Gastroenterology | Admitting: Gastroenterology

## 2015-07-26 ENCOUNTER — Encounter
Admission: RE | Disposition: A | Discharge status: Home / Self Care | Payer: Self-pay | Source: Ambulatory Visit | Attending: Gastroenterology

## 2015-07-26 DIAGNOSIS — R1310 Dysphagia, unspecified: Secondary | ICD-10-CM | POA: Diagnosis not present

## 2015-07-26 DIAGNOSIS — I2510 Atherosclerotic heart disease of native coronary artery without angina pectoris: Secondary | ICD-10-CM | POA: Diagnosis not present

## 2015-07-26 DIAGNOSIS — M19011 Primary osteoarthritis, right shoulder: Secondary | ICD-10-CM | POA: Diagnosis not present

## 2015-07-26 DIAGNOSIS — Z882 Allergy status to sulfonamides status: Secondary | ICD-10-CM | POA: Insufficient documentation

## 2015-07-26 DIAGNOSIS — M160 Bilateral primary osteoarthritis of hip: Secondary | ICD-10-CM | POA: Diagnosis not present

## 2015-07-26 DIAGNOSIS — Z888 Allergy status to other drugs, medicaments and biological substances status: Secondary | ICD-10-CM | POA: Insufficient documentation

## 2015-07-26 DIAGNOSIS — M19012 Primary osteoarthritis, left shoulder: Secondary | ICD-10-CM | POA: Diagnosis not present

## 2015-07-26 DIAGNOSIS — F419 Anxiety disorder, unspecified: Secondary | ICD-10-CM | POA: Insufficient documentation

## 2015-07-26 DIAGNOSIS — B3781 Candidal esophagitis: Secondary | ICD-10-CM | POA: Diagnosis not present

## 2015-07-26 DIAGNOSIS — M170 Bilateral primary osteoarthritis of knee: Secondary | ICD-10-CM | POA: Insufficient documentation

## 2015-07-26 DIAGNOSIS — Z87891 Personal history of nicotine dependence: Secondary | ICD-10-CM | POA: Insufficient documentation

## 2015-07-26 DIAGNOSIS — K5090 Crohn's disease, unspecified, without complications: Secondary | ICD-10-CM | POA: Insufficient documentation

## 2015-07-26 DIAGNOSIS — K222 Esophageal obstruction: Secondary | ICD-10-CM | POA: Diagnosis not present

## 2015-07-26 DIAGNOSIS — I10 Essential (primary) hypertension: Secondary | ICD-10-CM | POA: Diagnosis not present

## 2015-07-26 DIAGNOSIS — K219 Gastro-esophageal reflux disease without esophagitis: Secondary | ICD-10-CM | POA: Insufficient documentation

## 2015-07-26 HISTORY — PX: ESOPHAGOGASTRODUODENOSCOPY (EGD) WITH PROPOFOL: SHX5813

## 2015-07-26 SURGERY — ESOPHAGOGASTRODUODENOSCOPY (EGD) WITH PROPOFOL
Anesthesia: Monitor Anesthesia Care | Wound class: Clean Contaminated

## 2015-07-26 MED ORDER — PROPOFOL 10 MG/ML IV BOLUS
INTRAVENOUS | Status: DC | PRN
  Administered 2015-07-26: 50 mg via INTRAVENOUS
  Administered 2015-07-26: 100 mg via INTRAVENOUS

## 2015-07-26 MED ORDER — LACTATED RINGERS IV SOLN
INTRAVENOUS | Status: DC
  Administered 2015-07-26 (×2): via INTRAVENOUS

## 2015-07-26 MED ORDER — OXYCODONE HCL 5 MG PO TABS: 5.0000 mg | ORAL_TABLET | Freq: Once | ORAL | Status: DC | PRN

## 2015-07-26 MED ORDER — LIDOCAINE HCL (CARDIAC) 20 MG/ML IV SOLN
INTRAVENOUS | Status: DC | PRN
  Administered 2015-07-26: 30 mg via INTRAVENOUS

## 2015-07-26 MED ORDER — OXYCODONE HCL 5 MG/5ML PO SOLN: 5.0000 mg | Freq: Once | ORAL | Status: DC | PRN

## 2015-07-26 MED ORDER — GLYCOPYRROLATE 0.2 MG/ML IJ SOLN
INTRAMUSCULAR | Status: DC | PRN
  Administered 2015-07-26: 0.2 mg via INTRAVENOUS

## 2015-07-26 SURGICAL SUPPLY — 10 items
BALLN DILATOR 15-18 8 (BALLOONS) ×2
BALLOON DILATOR 15-18 8 (BALLOONS) IMPLANT
BLOCK BITE 60FR ADLT L/F GRN (MISCELLANEOUS) ×1 IMPLANT
CANISTER SUCT 1200ML W/VALVE (MISCELLANEOUS) ×1 IMPLANT
FORCEPS BIOP RAD 4 LRG CAP 4 (CUTTING FORCEPS) ×1 IMPLANT
GOWN CVR UNV OPN BCK APRN NK (MISCELLANEOUS) IMPLANT
GOWN ISOL THUMB LOOP REG UNIV (MISCELLANEOUS) ×2
KIT ENDO PROCEDURE OLY (KITS) ×1 IMPLANT
SYR INFLATION 60ML (SYRINGE) ×1 IMPLANT
WATER STERILE IRR 500ML POUR (IV SOLUTION) ×1 IMPLANT

## 2015-07-26 NOTE — H&P (Signed)
[facility]  [address], [place], [place] [address] Phone: [phone] Fax : [phone]  Primary Care Physician:  Lelon Huh, MD Primary Gastroenterologist:  Dr. Allen Norris  Pre-Procedure History & Physical: HPI:  Kimberly Hudson is a 59 y.o. female is here for an endoscopy.   Past Medical History  Diagnosis Date  . Crohn's disease   . Hypertension   . Coronary artery disease   . 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 Complication of anesthesia     propofol causes headaches  . Arthritis     osteo - shoulders, hips, knees  . Wears dentures     full upper  . Anemia     past hx    Past Surgical History  Procedure Laterality Date  . Upper gi endoscopy  02/2014  . Colonoscopy  02/2013    No polyps were found  . Esophagogastroduodenoscopy (egd) with propofol N/A 06/28/2015    Procedure: ESOPHAGOGASTRODUODENOSCOPY (EGD) WITH PROPOFOL;  Surgeon: Lucilla Lame, MD;  Location: [facility];  Service: Endoscopy;  Laterality: N/A;  with dialation    Prior to Admission medications   Medication Sig Start Date End Date Taking? Authorizing Provider  azaTHIOprine (IMURAN) 50 MG tablet Take 3 tablets (150 mg total) by mouth daily. 07/12/15  Yes Lucilla Lame, MD  diphenoxylate-atropine (LOMOTIL) 2.5-0.025 MG per tablet Take by mouth 4 (four) times daily as needed for diarrhea or loose stools.   Yes Historical Provider, MD  fluconazole (DIFLUCAN) 100 MG tablet Take 1 tablet (100 mg total) by mouth daily. 07/12/15  Yes Lucilla Lame, MD  HYDROcodone-acetaminophen (NORCO) 7.5-325 MG per tablet Take 1 tablet by mouth every 6 (six) hours as needed for moderate pain. 07/21/15  Yes Birdie Sons, MD  lisinopril-hydrochlorothiazide (PRINZIDE,ZESTORETIC) 20-12.5 MG per tablet Take 1 tablet by mouth daily.   Yes Historical Provider, MD  pantoprazole (PROTONIX) 40 MG tablet Take 40 mg by mouth daily.   Yes Historical Provider, MD  pravastatin (PRAVACHOL) 10 MG  tablet Take by mouth.   Yes Historical Provider, MD  promethazine (PHENERGAN) 25 MG tablet Take 1 tablet (25 mg total) by mouth every 6 (six) hours as needed for nausea or vomiting. 06/28/15  Yes Birdie Sons, MD  sertraline (ZOLOFT) 100 MG tablet Take 100 mg by mouth daily.   Yes Historical Provider, MD    Allergies as of 06/29/2015 - Review Complete 06/28/2015  Allergen Reaction Noted  . Nsaids Other (See Comments) 06/19/2015  . Sulfa antibiotics Rash 04/27/2015    Family History  Problem Relation Age of Onset  . COPD Mother   . Heart disease Mother   . COPD Father   . COPD Sister   . Heart disease Sister     Social History   Social History  . Marital Status: Widowed    Spouse Name: N/A  . Number of Children: N/A  . Years of Education: N/A   Occupational History  . Not on file.   Social History Main Topics  . Smoking status: Former Smoker    Quit date: 11/18/2012  . Smokeless tobacco: Not on file  . Alcohol Use: Yes     Comment: every once in a while  . Drug Use: No  . Sexual Activity: Not Currently   Other Topics Concern  . Not on file   Social History Narrative    Review of Systems: See HPI, otherwise negative ROS  Physical Exam: BP 108/55 mmHg  Temp(Src) 97.9 F (36.6 C) (Temporal)  Resp 16  Ht 5' 7"  (1.702 m)  Wt 180 lb (81.647 kg)  BMI 28.19 kg/m2  SpO2 98% General:   Alert,  pleasant and cooperative in NAD Head:  Normocephalic and atraumatic. Neck:  Supple; no masses or thyromegaly. Lungs:  Clear throughout to auscultation.    Heart:  Regular rate and rhythm. Abdomen:  Soft, nontender and nondistended. Normal bowel sounds, without guarding, and without rebound.   Neurologic:  Alert and  oriented x4;  grossly normal neurologically.  Impression/Plan: Kimberly Hudson is here for an endoscopy to be performed for dysphagia  Risks, benefits, limitations, and alternatives regarding  endoscopy have been reviewed with the patient.  Questions have  been answered.  All parties agreeable.   Ollen Bowl, MD  07/26/2015, 7:52 AM

## 2015-07-26 NOTE — Anesthesia Postprocedure Evaluation (Signed)
  Anesthesia Post-op Note  Patient: Kimberly Hudson  Procedure(s) Performed: Procedure(s) with comments: ESOPHAGOGASTRODUODENOSCOPY (EGD) WITH PROPOFOL, with dialation (N/A) - LEAVE PT LAST  Anesthesia type:MAC  Patient location: PACU  Post pain: Pain level controlled  Post assessment: Post-op Vital signs reviewed, Patient's Cardiovascular Status Stable, Respiratory Function Stable, Patent Airway and No signs of Nausea or vomiting  Post vital signs: Reviewed and stable  Last Vitals:  Filed Vitals:   07/26/15 0818  BP:   Pulse: 84  Temp: 36.4 C  Resp: 16    Level of consciousness: awake, alert  and patient cooperative  Complications: No apparent anesthesia complications

## 2015-07-26 NOTE — Op Note (Signed)
[facility] Patient Name: Kimberly Hudson Procedure Date: 07/26/2015 7:56 AM MRN: [id number] Account #: [id number] Date of Birth: Feb 27, 1956 Admit Type: Outpatient Age: 59 Room: [facility] OR ROOM 01 Gender: Female Note Status: Finalized Procedure:         Upper GI endoscopy Indications:       Dysphagia Providers:         Lucilla Lame, MD Referring MD:      Kirstie Peri. Caryn Section, MD (Referring MD) Medicines:         Propofol per Anesthesia Complications:     No immediate complications. Procedure:         Pre-Anesthesia Assessment:                    - Prior to the procedure, a History and Physical was                     performed, and patient medications and allergies were                     reviewed. The patient's tolerance of previous anesthesia                     was also reviewed. The risks and benefits of the procedure                     and the sedation options and risks were discussed with the                     patient. All questions were answered, and informed consent                     was obtained. Prior Anticoagulants: The patient has taken                     no previous anticoagulant or antiplatelet agents. ASA                     Grade Assessment: II - A patient with mild systemic                     disease. After reviewing the risks and benefits, the                     patient was deemed in satisfactory condition to undergo                     the procedure.                    After obtaining informed consent, the endoscope was passed                     under direct vision. Throughout the procedure, the                     patient's blood pressure, pulse, and oxygen saturations                     were monitored continuously. The Olympus GIF-[id number]                     Endoscope (S#. [id number]) was introduced through the mouth,  and advanced to the second part of duodenum. The upper GI                     endoscopy was  accomplished without difficulty. The patient                     tolerated the procedure well. Findings:      A benign-appearing, intrinsic moderate stenosis was found in the entire       esophagus and was traversed. A TTS dilator was passed through the scope.       Dilation with a 12-13.5-15 mm balloon (to a maximum balloon size of 15       mm) dilator was performed. The dilation site was examined following       endoscope reinsertion and showed complete resolution of luminal       narrowing.      The stomach was normal.      The examined duodenum was normal. Impression:        - Benign-appearing esophageal stricture. Dilated.                    - Normal stomach.                    - Normal examined duodenum.                    - No specimens collected. Recommendation:    - Repeat the upper endoscopy in 1 month for retreatment. Procedure Code(s): --- Professional ---                    [phone], Esophagogastroduodenoscopy, flexible, transoral;                     with transendoscopic balloon dilation of esophagus (less                     than 30 mm diameter) Diagnosis Code(s): --- Professional ---                    K22.2, Esophageal obstruction                    R13.10, Dysphagia, unspecified CPT copyright 2014 American Medical Association. All rights reserved. The codes documented in this report are preliminary and upon coder review may  be revised to meet current compliance requirements. Lucilla Lame, MD 07/26/2015 8:15:44 AM This report has been signed electronically. Number of Addenda: 0 Note Initiated On: 07/26/2015 7:56 AM Total Procedure Duration: 0 hours 5 minutes 30 seconds       [facility]

## 2015-07-26 NOTE — Transfer of Care (Signed)
Immediate Anesthesia Transfer of Care Note  Patient: Kimberly Hudson  Procedure(s) Performed: Procedure(s) with comments: ESOPHAGOGASTRODUODENOSCOPY (EGD) WITH PROPOFOL, with dialation (N/A) - LEAVE PT LAST  Patient Location: PACU  Anesthesia Type: MAC  Level of Consciousness: awake, alert  and patient cooperative  Airway and Oxygen Therapy: Patient Spontanous Breathing and Patient connected to supplemental oxygen  Post-op Assessment: Post-op Vital signs reviewed, Patient's Cardiovascular Status Stable, Respiratory Function Stable, Patent Airway and No signs of Nausea or vomiting  Post-op Vital Signs: Reviewed and stable  Complications: No apparent anesthesia complications

## 2015-07-26 NOTE — Anesthesia Procedure Notes (Signed)
Procedure Name: MAC Performed by: Virgel Haro Pre-anesthesia Checklist: Patient identified, Emergency Drugs available, Suction available, Patient being monitored and Timeout performed Patient Re-evaluated:Patient Re-evaluated prior to inductionOxygen Delivery Method: Nasal cannula       

## 2015-07-26 NOTE — Anesthesia Preprocedure Evaluation (Signed)
Anesthesia Evaluation  Patient identified by MRN, date of birth, ID band  Reviewed: NPO status   History of Anesthesia Complications (+) history of anesthetic complications (propofol causes headaches > pt ok with propofol to be used today)  Airway Mallampati: II  TM Distance: >3 FB Neck ROM: full    Dental  (+) Upper Dentures, Missing,    Pulmonary neg pulmonary ROS, former smoker,    Pulmonary exam normal        Cardiovascular Exercise Tolerance: Good hypertension, (-) CAD (pt denies) Normal cardiovascular exam  Lipids    Neuro/Psych Anxiety negative neurological ROS  negative psych ROS   GI/Hepatic Neg liver ROS, GERD  Controlled,Crohn's dz;  Esophageal candidiasis;   Endo/Other  negative endocrine ROS  Renal/GU negative Renal ROS  negative genitourinary   Musculoskeletal  (+) Arthritis ,   Abdominal   Peds  Hematology negative hematology ROS (+)   Anesthesia Other Findings   Reproductive/Obstetrics                             Anesthesia Physical Anesthesia Plan  ASA: II  Anesthesia Plan: MAC   Post-op Pain Management:    Induction:   Airway Management Planned:   Additional Equipment:   Intra-op Plan:   Post-operative Plan:   Informed Consent: I have reviewed the patients History and Physical, chart, labs and discussed the procedure including the risks, benefits and alternatives for the proposed anesthesia with the patient or authorized representative who has indicated his/her understanding and acceptance.     Plan Discussed with: CRNA  Anesthesia Plan Comments:         Anesthesia Quick Evaluation

## 2015-07-27 ENCOUNTER — Encounter: Payer: Self-pay | Admitting: Gastroenterology

## 2015-08-03 ENCOUNTER — Other Ambulatory Visit: Payer: Self-pay | Admitting: *Deleted

## 2015-08-03 MED ORDER — HYDROCODONE-ACETAMINOPHEN 7.5-325 MG PO TABS: 1.0000 | ORAL_TABLET | Freq: Four times a day (QID) | ORAL | Status: DC | PRN

## 2015-08-09 ENCOUNTER — Other Ambulatory Visit: Payer: Self-pay | Admitting: Gastroenterology

## 2015-08-18 ENCOUNTER — Other Ambulatory Visit: Payer: Self-pay

## 2015-08-18 MED ORDER — HYDROCODONE-ACETAMINOPHEN 7.5-325 MG PO TABS: 1.0000 | ORAL_TABLET | Freq: Four times a day (QID) | ORAL | Status: DC | PRN

## 2015-08-18 NOTE — Telephone Encounter (Signed)
Patient requesting refill. 

## 2015-08-21 ENCOUNTER — Other Ambulatory Visit: Payer: Self-pay | Admitting: Gastroenterology

## 2015-08-23 ENCOUNTER — Other Ambulatory Visit: Payer: Self-pay | Admitting: Gastroenterology

## 2015-08-28 ENCOUNTER — Ambulatory Visit (INDEPENDENT_AMBULATORY_CARE_PROVIDER_SITE_OTHER): Payer: No Typology Code available for payment source | Admitting: Family Medicine

## 2015-08-28 ENCOUNTER — Encounter: Payer: Self-pay | Admitting: Family Medicine

## 2015-08-28 VITALS — BP 102/54 | HR 83 | Temp 99.2°F | Resp 16 | Wt 185.0 lb

## 2015-08-28 DIAGNOSIS — E876 Hypokalemia: Secondary | ICD-10-CM | POA: Insufficient documentation

## 2015-08-28 DIAGNOSIS — B3781 Candidal esophagitis: Secondary | ICD-10-CM | POA: Diagnosis not present

## 2015-08-28 DIAGNOSIS — M1710 Unilateral primary osteoarthritis, unspecified knee: Secondary | ICD-10-CM | POA: Insufficient documentation

## 2015-08-28 DIAGNOSIS — G2581 Restless legs syndrome: Secondary | ICD-10-CM | POA: Insufficient documentation

## 2015-08-28 DIAGNOSIS — D649 Anemia, unspecified: Secondary | ICD-10-CM | POA: Insufficient documentation

## 2015-08-28 DIAGNOSIS — I10 Essential (primary) hypertension: Secondary | ICD-10-CM | POA: Insufficient documentation

## 2015-08-28 DIAGNOSIS — K219 Gastro-esophageal reflux disease without esophagitis: Secondary | ICD-10-CM

## 2015-08-28 DIAGNOSIS — Z23 Encounter for immunization: Secondary | ICD-10-CM

## 2015-08-28 DIAGNOSIS — G4700 Insomnia, unspecified: Secondary | ICD-10-CM

## 2015-08-28 DIAGNOSIS — B370 Candidal stomatitis: Secondary | ICD-10-CM | POA: Diagnosis not present

## 2015-08-28 DIAGNOSIS — F329 Major depressive disorder, single episode, unspecified: Secondary | ICD-10-CM | POA: Insufficient documentation

## 2015-08-28 DIAGNOSIS — E785 Hyperlipidemia, unspecified: Secondary | ICD-10-CM | POA: Insufficient documentation

## 2015-08-28 DIAGNOSIS — Z85828 Personal history of other malignant neoplasm of skin: Secondary | ICD-10-CM | POA: Insufficient documentation

## 2015-08-28 DIAGNOSIS — M19012 Primary osteoarthritis, left shoulder: Secondary | ICD-10-CM | POA: Insufficient documentation

## 2015-08-28 DIAGNOSIS — F32A Depression, unspecified: Secondary | ICD-10-CM | POA: Insufficient documentation

## 2015-08-28 DIAGNOSIS — M179 Osteoarthritis of knee, unspecified: Secondary | ICD-10-CM | POA: Insufficient documentation

## 2015-08-28 DIAGNOSIS — IMO0001 Reserved for inherently not codable concepts without codable children: Secondary | ICD-10-CM | POA: Insufficient documentation

## 2015-08-28 DIAGNOSIS — M7741 Metatarsalgia, right foot: Secondary | ICD-10-CM | POA: Insufficient documentation

## 2015-08-28 MED ORDER — FLUCONAZOLE 100 MG PO TABS: ORAL_TABLET | ORAL | Status: AC

## 2015-08-28 MED ORDER — FLUCONAZOLE 100 MG PO TABS: ORAL_TABLET | ORAL | Status: DC

## 2015-08-28 MED ORDER — CLONAZEPAM 1 MG PO TABS: 1.0000 mg | ORAL_TABLET | Freq: Every day | ORAL | Status: DC

## 2015-08-28 NOTE — Progress Notes (Signed)
Patient: Kimberly Hudson Female    DOB: April 18, 1956   59 y.o.   MRN: [id number] Visit Date: 08/28/2015  Today's Provider: Lelon Huh, MD   Chief Complaint  Patient presents with  . Thrush  . Insomnia   Subjective:     Thrush Patient has had white film in her mouth for one week. Patient states that her mouth and lips a very sore. She has history of candidial esophagitis previously responded well to oral diflucan.   Insomnia Primary symptoms: sleep disturbance, difficulty falling asleep, frequent awakening, malaise/fatigue, napping.  The current episode started more than one month (around 3 months). The onset quality is gradual. The problem occurs every several days (every 2-3 days). How many beverages per day that contain caffeine: 6 or more.  Types of beverages you drink: soda. Nothing relieves the symptoms. Past treatments include medication. The treatment provided no relief. Typical bedtime:  10-11 P.M..  How long after going to bed to you fall asleep: over an hour.   Duration of naps:  Two to four hours.   She state she feels like she needs to move her legs when she lays down to sleep, which keeps he up. The sensation is even worse when she takes OTC sleep aid.      Allergies  Allergen Reactions  . Nsaids Other (See Comments)    Because of Crohns  . Sulfa Antibiotics Rash   Previous Medications   AZATHIOPRINE (IMURAN) 50 MG TABLET    TAKE 3 TABLETS (150 MG TOTAL) BY MOUTH DAILY.   DIPHENOXYLATE-ATROPINE (LOMOTIL) 2.5-0.025 MG PER TABLET    Take by mouth 4 (four) times daily as needed for diarrhea or loose stools.   FLUCONAZOLE (DIFLUCAN) 100 MG TABLET    Take 1 tablet (100 mg total) by mouth daily.   HYDROCHLOROTHIAZIDE (HYDRODIURIL) 12.5 MG TABLET    Take 1 tablet by mouth daily.   HYDROCODONE-ACETAMINOPHEN (NORCO) 7.5-325 MG TABLET    Take 1 tablet by mouth every 6 (six) hours as needed for moderate pain.   LISINOPRIL-HYDROCHLOROTHIAZIDE (PRINZIDE,ZESTORETIC) 20-12.5  MG PER TABLET    Take 1 tablet by mouth daily.   PANTOPRAZOLE (PROTONIX) 40 MG TABLET    Take 40 mg by mouth daily.   PRAVASTATIN (PRAVACHOL) 10 MG TABLET    Take by mouth.   PRAVASTATIN (PRAVACHOL) 20 MG TABLET    Take 1 tablet by mouth daily.   PROMETHAZINE (PHENERGAN) 25 MG TABLET    Take 1 tablet (25 mg total) by mouth every 6 (six) hours as needed for nausea or vomiting.   SERTRALINE (ZOLOFT) 100 MG TABLET    Take 1.5 tablets by mouth daily.    Review of Systems  Constitutional: Positive for malaise/fatigue.  Cardiovascular: Negative for chest pain and palpitations.  Neurological: Negative for dizziness, light-headedness and headaches.  Psychiatric/Behavioral: Positive for sleep disturbance. The patient has insomnia.     Social History  Substance Use Topics  . Smoking status: Former Smoker -- 1.00 packs/day for 10 years    Types: Cigarettes    Quit date: 11/18/2012  . Smokeless tobacco: Not on file  . Alcohol Use: 0.0 oz/week    0 Standard drinks or equivalent per week     Comment: every once in a while   Objective:   BP 102/54 mmHg  Pulse 83  Temp(Src) 99.2 F (37.3 C) (Oral)  Resp 16  Wt 185 lb (83.915 kg)  SpO2 96%  Physical Exam  General appearance: alert,  well developed, well nourished, cooperative and in no distress Head: Normocephalic, without obvious abnormality, atraumatic Lungs: Respirations even and unlabored Extremities: No gross deformities Skin: Skin color, texture, turgor normal. No rashes seen  Psych: Appropriate mood and affect. Neurologic: Mental status: Alert, oriented to person, place, and time, thought content appropriate.     Assessment & Plan:     1. Thrush   2. Esophageal candidiasis (HCC)  - fluconazole (DIFLUCAN) 100 MG tablet; Take 2 tablets first day then one daily  Dispense: 11 tablet; Refill: 2  3. Restless leg syndrome Clonazepam 51m at bedtime.   4. Insomnia Likely exacerbated by RLS. Try clonazepam as above.   5. Need  for influenza vaccination  - Flu Vaccine QUAD 36+ mos IM       DLelon Huh MD  [facility]

## 2015-09-05 ENCOUNTER — Other Ambulatory Visit: Payer: Self-pay | Admitting: *Deleted

## 2015-09-05 MED ORDER — HYDROCODONE-ACETAMINOPHEN 7.5-325 MG PO TABS: 1.0000 | ORAL_TABLET | Freq: Four times a day (QID) | ORAL | Status: DC | PRN

## 2015-09-19 ENCOUNTER — Other Ambulatory Visit: Payer: Self-pay | Admitting: *Deleted

## 2015-09-19 MED ORDER — HYDROCODONE-ACETAMINOPHEN 7.5-325 MG PO TABS: 1.0000 | ORAL_TABLET | Freq: Four times a day (QID) | ORAL | Status: DC | PRN

## 2015-10-03 ENCOUNTER — Other Ambulatory Visit: Payer: Self-pay | Admitting: *Deleted

## 2015-10-03 MED ORDER — HYDROCODONE-ACETAMINOPHEN 7.5-325 MG PO TABS: 1.0000 | ORAL_TABLET | Freq: Four times a day (QID) | ORAL | Status: DC | PRN

## 2015-10-17 ENCOUNTER — Other Ambulatory Visit: Payer: Self-pay

## 2015-10-17 MED ORDER — HYDROCODONE-ACETAMINOPHEN 7.5-325 MG PO TABS: 1.0000 | ORAL_TABLET | Freq: Four times a day (QID) | ORAL | Status: DC | PRN

## 2015-10-17 MED ORDER — CLONAZEPAM 1 MG PO TABS: 1.0000 mg | ORAL_TABLET | Freq: Every day | ORAL | Status: DC

## 2015-10-17 NOTE — Telephone Encounter (Signed)
Patient requesting refill. 

## 2015-10-30 ENCOUNTER — Other Ambulatory Visit: Payer: Self-pay | Admitting: *Deleted

## 2015-10-31 MED ORDER — HYDROCODONE-ACETAMINOPHEN 7.5-325 MG PO TABS: 1.0000 | ORAL_TABLET | Freq: Four times a day (QID) | ORAL | Status: DC | PRN

## 2015-11-15 ENCOUNTER — Other Ambulatory Visit: Payer: Self-pay | Admitting: *Deleted

## 2015-11-15 MED ORDER — HYDROCODONE-ACETAMINOPHEN 7.5-325 MG PO TABS: 1.0000 | ORAL_TABLET | Freq: Four times a day (QID) | ORAL | Status: DC | PRN

## 2015-11-29 ENCOUNTER — Other Ambulatory Visit: Payer: Self-pay | Admitting: *Deleted

## 2015-11-29 MED ORDER — HYDROCODONE-ACETAMINOPHEN 7.5-325 MG PO TABS: 1.0000 | ORAL_TABLET | Freq: Four times a day (QID) | ORAL | Status: DC | PRN

## 2015-12-08 ENCOUNTER — Encounter: Payer: Self-pay | Admitting: Family Medicine

## 2015-12-08 ENCOUNTER — Ambulatory Visit (INDEPENDENT_AMBULATORY_CARE_PROVIDER_SITE_OTHER): Payer: BLUE CROSS/BLUE SHIELD | Admitting: Family Medicine

## 2015-12-08 VITALS — BP 104/50 | HR 77 | Temp 97.9°F | Resp 18 | Wt 186.0 lb

## 2015-12-08 DIAGNOSIS — J0110 Acute frontal sinusitis, unspecified: Secondary | ICD-10-CM | POA: Diagnosis not present

## 2015-12-08 MED ORDER — AZITHROMYCIN 250 MG PO TABS: ORAL_TABLET | ORAL | Status: AC

## 2015-12-08 NOTE — Progress Notes (Signed)
Patient: Kimberly Hudson Female    DOB: 08/15/1956   60 y.o.   MRN: [id number] Visit Date: 12/08/2015  Today's Provider: Lelon Huh, MD   Chief Complaint  Patient presents with  . Cough    x 6 days   Subjective:    Cough This is a new problem. Episode onset: 6 days ago. The problem has been unchanged. The cough is productive of sputum (white colored phlegm). Associated symptoms include chest pain (rib pain due to excessive coughing) and a sore throat. Pertinent negatives include no chills, ear pain, fever, hemoptysis, nasal congestion, postnasal drip, rhinorrhea, shortness of breath, sweats or wheezing. Exacerbated by: exertion. She has tried OTC cough suppressant for the symptoms. The treatment provided no relief.       Allergies  Allergen Reactions  . Nsaids Other (See Comments)    Because of Crohns  . Sulfa Antibiotics Rash   Previous Medications   AZATHIOPRINE (IMURAN) 50 MG TABLET    TAKE 3 TABLETS (150 MG TOTAL) BY MOUTH DAILY.   CLONAZEPAM (KLONOPIN) 1 MG TABLET    Take 1 tablet (1 mg total) by mouth at bedtime.   DIPHENOXYLATE-ATROPINE (LOMOTIL) 2.5-0.025 MG PER TABLET    Take by mouth 4 (four) times daily as needed for diarrhea or loose stools.   HYDROCODONE-ACETAMINOPHEN (NORCO) 7.5-325 MG TABLET    Take 1 tablet by mouth every 6 (six) hours as needed for moderate pain.   LISINOPRIL-HYDROCHLOROTHIAZIDE (PRINZIDE,ZESTORETIC) 20-12.5 MG PER TABLET    Take 1 tablet by mouth daily.   PANTOPRAZOLE (PROTONIX) 40 MG TABLET    Take 40 mg by mouth daily.   PRAVASTATIN (PRAVACHOL) 10 MG TABLET    Take by mouth.   PROMETHAZINE (PHENERGAN) 25 MG TABLET    Take 1 tablet (25 mg total) by mouth every 6 (six) hours as needed for nausea or vomiting.   SERTRALINE (ZOLOFT) 100 MG TABLET    Take 1.5 tablets by mouth daily.    Review of Systems  Constitutional: Positive for diaphoresis. Negative for fever, chills, appetite change and fatigue.  HENT: Positive for sinus pressure and  sore throat. Negative for congestion, ear discharge, ear pain, postnasal drip, rhinorrhea, sneezing and voice change.   Respiratory: Positive for cough (productive with white phlegm). Negative for hemoptysis, chest tightness, shortness of breath and wheezing.   Cardiovascular: Positive for chest pain (rib pain due to excessive coughing). Negative for palpitations.  Gastrointestinal: Negative for nausea, vomiting and abdominal pain.  Neurological: Positive for light-headedness. Negative for dizziness and weakness.    Social History  Substance Use Topics  . Smoking status: Former Smoker -- 1.00 packs/day for 10 years    Types: Cigarettes    Quit date: 11/18/2012  . Smokeless tobacco: Not on file  . Alcohol Use: 0.0 oz/week    0 Standard drinks or equivalent per week     Comment: every once in a while   Objective:   BP 104/50 mmHg  Pulse 77  Temp(Src) 97.9 F (36.6 C) (Oral)  Resp 18  Wt 186 lb (84.369 kg)  SpO2 96%  Physical Exam  General Appearance:    Alert, cooperative, no distress  HENT:   bilateral TM normal without fluid or infection, neck without nodes, frontal and maxillary sinus tender, post nasal drip noted and nasal mucosa pale and congested  Eyes:    PERRL, conjunctiva/corneas clear, EOM's intact       Lungs:     Clear to auscultation bilaterally,  respirations unlabored  Heart:    Regular rate and rhythm  Neurologic:   Awake, alert, oriented x 3. No apparent focal neurological           defect.            Assessment & Plan:     1. Acute frontal sinusitis, recurrence not specified  - azithromycin (ZITHROMAX) 250 MG tablet; 2 by mouth today, then 1 daily for 4 days  Dispense: 6 tablet; Refill: 0  OTC saline spray and mucinex. Call if symptoms change or if not rapidly improving.          Lelon Huh, MD  [facility]

## 2015-12-13 ENCOUNTER — Other Ambulatory Visit: Payer: Self-pay

## 2015-12-13 MED ORDER — HYDROCODONE-ACETAMINOPHEN 7.5-325 MG PO TABS: 1.0000 | ORAL_TABLET | Freq: Four times a day (QID) | ORAL | Status: DC | PRN

## 2015-12-13 NOTE — Telephone Encounter (Signed)
Patient sent e mail requesting refill on 12/12/2015. Call back for patient: [phone]

## 2015-12-26 ENCOUNTER — Other Ambulatory Visit: Payer: Self-pay | Admitting: *Deleted

## 2015-12-27 MED ORDER — HYDROCODONE-ACETAMINOPHEN 7.5-325 MG PO TABS: 1.0000 | ORAL_TABLET | Freq: Four times a day (QID) | ORAL | Status: DC | PRN

## 2016-01-07 ENCOUNTER — Other Ambulatory Visit: Payer: Self-pay | Admitting: Family Medicine

## 2016-01-08 ENCOUNTER — Telehealth: Payer: Self-pay | Admitting: Gastroenterology

## 2016-01-08 DIAGNOSIS — D849 Immunodeficiency, unspecified: Secondary | ICD-10-CM | POA: Insufficient documentation

## 2016-01-08 DIAGNOSIS — D899 Disorder involving the immune mechanism, unspecified: Secondary | ICD-10-CM

## 2016-01-08 NOTE — Telephone Encounter (Signed)
Please read message above. Pt stated her symptoms are not as bad as the weekend but is worried the symptoms will get worse. Pt stated she has taken Bentyl in the past with success. She is also requesting a refill on her Lomotil. Please advise if you want me to send these 2 rx's or have her to come in for an appt. I can schedule her for this Thursday 24th. Should I order a CRP too?

## 2016-01-08 NOTE — Telephone Encounter (Signed)
This is a patient of Dr Dorothey Baseman and she has a history of Chrohn's Disease. She last saw Dr Allen Norris in September when she had an EGD. On Friday morning she states she began to vomit bile. She has been having very painful intestinal cramping Saturday and Sunday. She would like to see Dr Allen Norris this week as soon as possible.

## 2016-01-09 ENCOUNTER — Other Ambulatory Visit: Payer: Self-pay

## 2016-01-09 DIAGNOSIS — K50919 Crohn's disease, unspecified, with unspecified complications: Secondary | ICD-10-CM

## 2016-01-09 MED ORDER — DIPHENOXYLATE-ATROPINE 2.5-0.025 MG PO TABS: 1.0000 | ORAL_TABLET | Freq: Four times a day (QID) | ORAL | Status: DC | PRN

## 2016-01-09 MED ORDER — DICYCLOMINE HCL 20 MG PO TABS: 20.0000 mg | ORAL_TABLET | Freq: Three times a day (TID) | ORAL | Status: DC

## 2016-01-09 NOTE — Telephone Encounter (Signed)
Please refill her meds.

## 2016-01-09 NOTE — Telephone Encounter (Signed)
Pt's rx's were refilled sent to her pharmacy. Pt is aware this was done.

## 2016-01-10 ENCOUNTER — Other Ambulatory Visit: Payer: Self-pay | Admitting: *Deleted

## 2016-01-10 MED ORDER — HYDROCODONE-ACETAMINOPHEN 7.5-325 MG PO TABS: 1.0000 | ORAL_TABLET | Freq: Four times a day (QID) | ORAL | Status: DC | PRN

## 2016-01-15 ENCOUNTER — Other Ambulatory Visit: Payer: Self-pay | Admitting: *Deleted

## 2016-01-15 MED ORDER — PANTOPRAZOLE SODIUM 40 MG PO TBEC: 40.0000 mg | DELAYED_RELEASE_TABLET | Freq: Every day | ORAL | Status: DC

## 2016-01-15 NOTE — Telephone Encounter (Signed)
Received fax requesting new rx for pantoprazole 40 mg qd, be sent to [facility].

## 2016-01-22 ENCOUNTER — Other Ambulatory Visit: Payer: Self-pay | Admitting: Family Medicine

## 2016-01-23 NOTE — Telephone Encounter (Signed)
Received request from pharmacy for refill for fluconazole. Please check with patient to see what symptoms she is having.

## 2016-01-24 ENCOUNTER — Other Ambulatory Visit: Payer: Self-pay | Admitting: *Deleted

## 2016-01-24 MED ORDER — HYDROCODONE-ACETAMINOPHEN 7.5-325 MG PO TABS: 1.0000 | ORAL_TABLET | Freq: Four times a day (QID) | ORAL | Status: DC | PRN

## 2016-02-02 NOTE — Telephone Encounter (Signed)
LMTCB

## 2016-02-06 ENCOUNTER — Other Ambulatory Visit: Payer: Self-pay | Admitting: *Deleted

## 2016-02-06 MED ORDER — HYDROCODONE-ACETAMINOPHEN 7.5-325 MG PO TABS: 1.0000 | ORAL_TABLET | Freq: Four times a day (QID) | ORAL | Status: DC | PRN

## 2016-02-15 ENCOUNTER — Other Ambulatory Visit: Payer: Self-pay | Admitting: Family Medicine

## 2016-02-21 ENCOUNTER — Other Ambulatory Visit: Payer: Self-pay | Admitting: *Deleted

## 2016-02-21 ENCOUNTER — Encounter: Payer: Self-pay | Admitting: Physician Assistant

## 2016-02-21 ENCOUNTER — Ambulatory Visit (INDEPENDENT_AMBULATORY_CARE_PROVIDER_SITE_OTHER): Payer: BLUE CROSS/BLUE SHIELD | Admitting: Physician Assistant

## 2016-02-21 VITALS — BP 130/84 | HR 67 | Temp 98.1°F | Resp 16 | Wt 190.6 lb

## 2016-02-21 DIAGNOSIS — J0140 Acute pansinusitis, unspecified: Secondary | ICD-10-CM | POA: Diagnosis not present

## 2016-02-21 DIAGNOSIS — R05 Cough: Secondary | ICD-10-CM

## 2016-02-21 DIAGNOSIS — B370 Candidal stomatitis: Secondary | ICD-10-CM | POA: Diagnosis not present

## 2016-02-21 DIAGNOSIS — R059 Cough, unspecified: Secondary | ICD-10-CM

## 2016-02-21 MED ORDER — HYDROCODONE-ACETAMINOPHEN 7.5-325 MG PO TABS: 1.0000 | ORAL_TABLET | Freq: Four times a day (QID) | ORAL | Status: DC | PRN

## 2016-02-21 MED ORDER — AZITHROMYCIN 250 MG PO TABS: ORAL_TABLET | ORAL | Status: DC

## 2016-02-21 MED ORDER — HYDROCODONE-HOMATROPINE 5-1.5 MG/5ML PO SYRP: 5.0000 mL | ORAL_SOLUTION | Freq: Three times a day (TID) | ORAL | Status: DC | PRN

## 2016-02-21 MED ORDER — FLUCONAZOLE 150 MG PO TABS: 150.0000 mg | ORAL_TABLET | Freq: Once | ORAL | Status: DC

## 2016-02-21 NOTE — Patient Instructions (Signed)
Thrush, Adult Kimberly Hudson, also called oral candidiasis, is a fungal infection that develops in the mouth and throat and on the tongue. It causes white patches to form on the mouth and tongue. Kimberly Hudson is most common in older adults, but it can occur at any age.  Many cases of thrush are mild, but this infection can also be more serious. Kimberly Hudson can be a recurring problem for people who have chronic illnesses or who take medicines that limit the body's ability to fight infection. Because these people have difficulty fighting infections, the fungus that causes thrush can spread throughout the body. This can cause life-threatening blood or organ infections. CAUSES  Kimberly Hudson is usually caused by a yeast called Candida albicans. This fungus is normally present in small amounts in the mouth and on other mucous membranes. It usually causes no harm. However, when conditions are present that allow the fungus to grow uncontrolled, it invades surrounding tissues and becomes an infection. Less often, other Candida species can also lead to thrush.  RISK FACTORS Kimberly Hudson is more likely to develop in the following people:  People with an impaired ability to fight infection (weakened immune system).   Older adults.   People with HIV.   People with diabetes.   People with dry mouth (xerostomia).   Pregnant women.   People with poor dental care, especially those who have false teeth.   People who use antibiotic medicines.  SIGNS AND SYMPTOMS  Kimberly Hudson can be a mild infection that causes no symptoms. If symptoms develop, they may include:   A burning feeling in the mouth and throat. This can occur at the start of a thrush infection.   White patches that adhere to the mouth and tongue. The tissue around the patches may be red, raw, and painful. If rubbed (during tooth brushing, for example), the patches and the tissue of the mouth may bleed easily.   A bad taste in the mouth or difficulty tasting foods.    Cottony feeling in the mouth.   Pain during eating and swallowing. DIAGNOSIS  Your health care provider can usually diagnose thrush by looking in your mouth and asking you questions about your health.  TREATMENT  Medicines that help prevent the growth of fungi (antifungals) are the standard treatment for thrush. These medicines are either applied directly to the affected area (topical) or swallowed (oral). The treatment will depend on the severity of the condition.  Mild Thrush Mild cases of thrush may clear up with the use of an antifungal mouth rinse or lozenges. Treatment usually lasts about 14 days.  Moderate to Severe Thrush  More severe thrush infections that have spread to the esophagus are treated with an oral antifungal medicine. A topical antifungal medicine may also be used.   For some severe infections, a treatment period longer than 14 days may be needed.   Oral antifungal medicines are almost never used during pregnancy because the fetus may be harmed. However, if a pregnant woman has a rare, severe thrush infection that has spread to her blood, oral antifungal medicines may be used. In this case, the risk of harm to the mother and fetus from the severe thrush infection may be greater than the risk posed by the use of antifungal medicines.  Persistent or Recurrent Thrush For cases of thrush that do not go away or keep coming back, treatment may involve the following:   Treatment may be needed twice as long as the symptoms last.   Treatment will include  both oral and topical antifungal medicines.   People with weakened immune systems can take an antifungal medicine on a continuous basis to prevent thrush infections.  It is important to treat conditions that make you more likely to get thrush, such as diabetes or HIV.  HOME CARE INSTRUCTIONS   Only take over-the-counter or prescription medicine as directed by your health care provider. Talk to your health care  provider about an over-the-counter medicine called gentian violet, which kills bacteria and fungi.   Eat plain, unflavored yogurt as directed by your health care provider. Check the label to make sure the yogurt contains live cultures. This yogurt can help healthy bacteria grow in the mouth that can stop the growth of the fungus that causes thrush.   Try these measures to help reduce the discomfort of thrush:   Drink cold liquids such as water or iced tea.   Try flavored ice treats or frozen juices.   Eat foods that are easy to swallow, such as gelatin, ice cream, or custard.   If the patches in your mouth are painful, try drinking from a straw.   Rinse your mouth several times a day with a warm saltwater rinse. You can make the saltwater mixture with 1 tsp (6 g) of salt in 8 fl oz (0.2 L) of warm water.   If you wear dentures, remove the dentures before going to bed, brush them vigorously, and soak them in a cleaning solution as directed by your health care provider.   Women who are breastfeeding should clean their nipples with an antifungal medicine as directed by their health care provider. Dry the nipples after breastfeeding. Applying lanolin-containing body lotion may help relieve nipple soreness.  SEEK MEDICAL CARE IF:  Your symptoms are getting worse or are not improving within 7 days of starting treatment.   You have symptoms of spreading infection, such as white patches on the skin outside of the mouth.   You are nursing and you have redness, burning, or pain in the nipples that is not relieved with treatment.  MAKE SURE YOU:  Understand these instructions.  Will watch your condition.  Will get help right away if you are not doing well or get worse.   This information is not intended to replace advice given to you by your health care provider. Make sure you discuss any questions you have with your health care provider.   Document Released: 07/30/2004 Document  Revised: 11/25/2014 Document Reviewed: 06/07/2013 Elsevier Interactive Patient Education 2016 Elsevier [employer] Sinusitis, Adult Sinusitis is redness, soreness, and inflammation of the paranasal sinuses. Paranasal sinuses are air pockets within the bones of your face. They are located beneath your eyes, in the middle of your forehead, and above your eyes. In healthy paranasal sinuses, mucus is able to drain out, and air is able to circulate through them by way of your nose. However, when your paranasal sinuses are inflamed, mucus and air can become trapped. This can allow bacteria and other germs to grow and cause infection. Sinusitis can develop quickly and last only a short time (acute) or continue over a long period (chronic). Sinusitis that lasts for more than 12 weeks is considered chronic. CAUSES Causes of sinusitis include:  Allergies.  Structural abnormalities, such as displacement of the cartilage that separates your nostrils (deviated septum), which can decrease the air flow through your nose and sinuses and affect sinus drainage.  Functional abnormalities, such as when the small hairs (cilia) that line your sinuses and  help remove mucus do not work properly or are not present. SIGNS AND SYMPTOMS Symptoms of acute and chronic sinusitis are the same. The primary symptoms are pain and pressure around the affected sinuses. Other symptoms include:  Upper toothache.  Earache.  Headache.  Bad breath.  Decreased sense of smell and taste.  A cough, which worsens when you are lying flat.  Fatigue.  Fever.  Thick drainage from your nose, which often is green and may contain pus (purulent).  Swelling and warmth over the affected sinuses. DIAGNOSIS Your health care provider will perform a physical exam. During your exam, your health care provider may perform any of the following to help determine if you have acute sinusitis or chronic sinusitis:  Look in your nose for signs of  abnormal growths in your nostrils (nasal polyps).  Tap over the affected sinus to check for signs of infection.  View the inside of your sinuses using an imaging device that has a light attached (endoscope). If your health care provider suspects that you have chronic sinusitis, one or more of the following tests may be recommended:  Allergy tests.  Nasal culture. A sample of mucus is taken from your nose, sent to a lab, and screened for bacteria.  Nasal cytology. A sample of mucus is taken from your nose and examined by your health care provider to determine if your sinusitis is related to an allergy. TREATMENT Most cases of acute sinusitis are related to a viral infection and will resolve on their own within 10 days. Sometimes, medicines are prescribed to help relieve symptoms of both acute and chronic sinusitis. These may include pain medicines, decongestants, nasal steroid sprays, or saline sprays. However, for sinusitis related to a bacterial infection, your health care provider will prescribe antibiotic medicines. These are medicines that will help kill the bacteria causing the infection. Rarely, sinusitis is caused by a fungal infection. In these cases, your health care provider will prescribe antifungal medicine. For some cases of chronic sinusitis, surgery is needed. Generally, these are cases in which sinusitis recurs more than 3 times per year, despite other treatments. HOME CARE INSTRUCTIONS  Drink plenty of water. Water helps thin the mucus so your sinuses can drain more easily.  Use a humidifier.  Inhale steam 3-4 times a day (for example, sit in the bathroom with the shower running).  Apply a warm, moist washcloth to your face 3-4 times a day, or as directed by your health care provider.  Use saline nasal sprays to help moisten and clean your sinuses.  Take medicines only as directed by your health care provider.  If you were prescribed either an antibiotic or antifungal  medicine, finish it all even if you start to feel better. SEEK IMMEDIATE MEDICAL CARE IF:  You have increasing pain or severe headaches.  You have nausea, vomiting, or drowsiness.  You have swelling around your face.  You have vision problems.  You have a stiff neck.  You have difficulty breathing.   This information is not intended to replace advice given to you by your health care provider. Make sure you discuss any questions you have with your health care provider.   Document Released: 11/04/2005 Document Revised: 11/25/2014 Document Reviewed: 11/19/2011 Elsevier Interactive Patient Education [employer].

## 2016-02-21 NOTE — Progress Notes (Signed)
Patient: Kimberly Hudson Female    DOB: 1956-10-12   60 y.o.   MRN: [id number] Visit Date: 02/21/2016  Today's Provider: Mar Daring, PA-C   Chief Complaint  Patient presents with  . URI   Subjective:    URI  This is a new problem. The current episode started 1 to 4 weeks ago (For almost a week). The problem has been gradually worsening. There has been no fever. Associated symptoms include coughing, a rash (in her mouth), rhinorrhea, sneezing, a sore throat (from coughing) and wheezing. Pertinent negatives include no chest pain, congestion, diarrhea, ear pain, headaches, nausea or vomiting. She has tried antihistamine for the symptoms. The treatment provided no relief.   Rash: Patient is also concern about a yeast infection in her mouth. She had the rash for the past five days. She have been eating yogurt but is not helping. She does get thrush often secondary to her azathioprine for her crohn's.     Allergies  Allergen Reactions  . Nsaids Other (See Comments)    Because of Crohns  . Sulfa Antibiotics Rash   Previous Medications   AZATHIOPRINE (IMURAN) 50 MG TABLET    TAKE 3 TABLETS (150 MG TOTAL) BY MOUTH DAILY.   CLONAZEPAM (KLONOPIN) 1 MG TABLET    Take 1 tablet (1 mg total) by mouth at bedtime.   DICYCLOMINE (BENTYL) 20 MG TABLET    Take 1 tablet (20 mg total) by mouth 3 (three) times daily before meals.   DIPHENOXYLATE-ATROPINE (LOMOTIL) 2.5-0.025 MG TABLET    Take 1 tablet by mouth 4 (four) times daily as needed for diarrhea or loose stools.   HYDROCODONE-ACETAMINOPHEN (NORCO) 7.5-325 MG TABLET    Take 1 tablet by mouth every 6 (six) hours as needed for moderate pain.   LISINOPRIL-HYDROCHLOROTHIAZIDE (PRINZIDE,ZESTORETIC) 10-12.5 MG TABLET    Take 1 tablet by mouth daily. PATIENT NEEDS TO SCHEDULE OFFICE VISIT FOR FOLLOW UP   PANTOPRAZOLE (PROTONIX) 40 MG TABLET    Take 1 tablet (40 mg total) by mouth daily.   PRAVASTATIN (PRAVACHOL) 10 MG TABLET    Take by mouth.   PROMETHAZINE (PHENERGAN) 25 MG TABLET    Take 1 tablet (25 mg total) by mouth every 6 (six) hours as needed for nausea or vomiting.   SERTRALINE (ZOLOFT) 100 MG TABLET    Take 1.5 tablets by mouth daily.    Review of Systems  Constitutional: Positive for fatigue. Negative for fever and chills.  HENT: Positive for postnasal drip, rhinorrhea, sneezing and sore throat (from coughing). Negative for congestion, ear pain and sinus pressure.   Eyes:       Watery eyes.  Respiratory: Positive for cough, chest tightness, shortness of breath and wheezing.   Cardiovascular: Negative for chest pain, palpitations and leg swelling.  Gastrointestinal: Negative for nausea, vomiting and diarrhea.  Skin: Positive for rash (in her mouth).  Neurological: Negative for dizziness and headaches.    Social History  Substance Use Topics  . Smoking status: Former Smoker -- 1.00 packs/day for 10 years    Types: Cigarettes    Quit date: 11/18/2012  . Smokeless tobacco: Not on file  . Alcohol Use: 0.0 oz/week    0 Standard drinks or equivalent per week     Comment: every once in a while   Objective:   BP 130/84 mmHg  Pulse 67  Temp(Src) 98.1 F (36.7 C) (Oral)  Resp 16  Wt 190 lb 9.6 oz (86.456 kg)  SpO2 96%  Physical Exam  Constitutional: She appears well-developed and well-nourished. No distress.  HENT:  Head: Normocephalic and atraumatic.  Right Ear: Hearing, tympanic membrane, external ear and ear canal normal.  Left Ear: Hearing, tympanic membrane, external ear and ear canal normal.  Nose: Mucosal edema and rhinorrhea present. Right sinus exhibits maxillary sinus tenderness. Left sinus exhibits maxillary sinus tenderness.  Mouth/Throat: Uvula is midline and mucous membranes are normal. Oral lesions present. No uvula swelling. Oropharyngeal exudate and posterior oropharyngeal erythema present. No posterior oropharyngeal edema.  Thrush noted on tongue and left cheek   Eyes: Conjunctivae are normal.  Pupils are equal, round, and reactive to light. Right eye exhibits no discharge. Left eye exhibits no discharge. No scleral icterus.  Neck: Normal range of motion. Neck supple. No tracheal deviation present. No thyromegaly present.  Cardiovascular: Normal rate, regular rhythm and normal heart sounds.  Exam reveals no gallop and no friction rub.   No murmur heard. Pulmonary/Chest: Effort normal and breath sounds normal. No stridor. No respiratory distress. She has no wheezes. She has no rales.  Lymphadenopathy:    She has no cervical adenopathy.  Skin: Skin is warm and dry. She is not diaphoretic.  Vitals reviewed.       Assessment & Plan:     1. Acute pansinusitis, recurrence not specified Worsening symptoms. Will treat with z-pak as below. She is immunocompromised secondary to Crohn's treatment. She is to call if symptoms persist or fail to improve.  - azithromycin (ZITHROMAX) 250 MG tablet; Take 2 tablets PO on day one, and one tablet PO daily thereafter until completed.  Dispense: 6 tablet; Refill: 0  2. Cough Worsening nighttime cough. Will give hycodan cough syrup as below. She needs to stay well hydrated and get plenty of rest. She is to call if symptoms fail to improve.  - HYDROcodone-homatropine (HYCODAN) 5-1.5 MG/5ML syrup; Take 5 mLs by mouth every 8 (eight) hours as needed.  Dispense: 180 mL; Refill: 0  3. Oral thrush Worsening. She states she has used diflucan for thrush previously. Will give as below. She is to call if symptoms persist and will send Nystatin susp. - fluconazole (DIFLUCAN) 150 MG tablet; Take 1 tablet (150 mg total) by mouth once. May take 2nd tab PO 48-72 hours after the 1st if needed and may repeat.  Dispense: 3 tablet; Refill: 0       Mar Daring, PA-C  [facility]

## 2016-02-28 ENCOUNTER — Other Ambulatory Visit: Payer: Self-pay | Admitting: Family Medicine

## 2016-02-28 NOTE — Telephone Encounter (Signed)
Please call in clonazepam

## 2016-02-29 NOTE — Telephone Encounter (Signed)
Rx called in to pharmacy. 

## 2016-03-06 ENCOUNTER — Other Ambulatory Visit: Payer: Self-pay | Admitting: *Deleted

## 2016-03-06 MED ORDER — HYDROCODONE-ACETAMINOPHEN 7.5-325 MG PO TABS: 1.0000 | ORAL_TABLET | Freq: Four times a day (QID) | ORAL | Status: DC | PRN

## 2016-03-18 ENCOUNTER — Telehealth: Payer: Self-pay | Admitting: Gastroenterology

## 2016-03-18 NOTE — Telephone Encounter (Signed)
Patient would like to talk to you regarding the medication for GERD doesn't seem to be working now and she feels as if her throat is beginning to close up.

## 2016-03-19 ENCOUNTER — Other Ambulatory Visit: Payer: Self-pay

## 2016-03-19 ENCOUNTER — Other Ambulatory Visit: Payer: Self-pay | Admitting: *Deleted

## 2016-03-19 DIAGNOSIS — B3781 Candidal esophagitis: Secondary | ICD-10-CM

## 2016-03-19 MED ORDER — FLUCONAZOLE 100 MG PO TABS: 100.0000 mg | ORAL_TABLET | Freq: Every day | ORAL | Status: DC

## 2016-03-19 MED ORDER — HYDROCODONE-ACETAMINOPHEN 7.5-325 MG PO TABS: 1.0000 | ORAL_TABLET | Freq: Four times a day (QID) | ORAL | Status: DC | PRN

## 2016-03-19 NOTE — Telephone Encounter (Signed)
That would be fine please send then Diflucan 100 mg to be taken once a day for 2 weeks.

## 2016-03-19 NOTE — Telephone Encounter (Signed)
Pt stated she currently has a yeast infection in her mouth and esophagus. She was told it is coming from the Imuran. Pt is requesting I send her some diflucan to her pharmacy to help with this. She thinks the issue with her reflux is coming from the yeast. Please advise if this is okay to send to her pharmacy.

## 2016-03-19 NOTE — Telephone Encounter (Signed)
Patient has called again in regards to the note below. Please call patient at [phone].  Patient does not feel that the Protonix is helping anymore, she is adding an over the counter Zantac to try to help the discomfort. Please call her with advise.

## 2016-03-19 NOTE — Telephone Encounter (Signed)
Called pt back to let her know Dr. Allen Norris ok'd her Diflucan to her pharmacy. Pt is to do 1 tablet daily for 2 weeks. Pt will contact me if this doesn't help.

## 2016-04-03 ENCOUNTER — Other Ambulatory Visit: Payer: Self-pay | Admitting: Family Medicine

## 2016-04-03 MED ORDER — HYDROCODONE-ACETAMINOPHEN 7.5-325 MG PO TABS: 1.0000 | ORAL_TABLET | Freq: Four times a day (QID) | ORAL | Status: DC | PRN

## 2016-04-03 NOTE — Telephone Encounter (Signed)
Pt needs refill on her HYDROcodone-acetaminophen (NORCO) 7.5-325 MG tablet  Please advise when ready.  ThanksTeri

## 2016-04-06 ENCOUNTER — Ambulatory Visit (INDEPENDENT_AMBULATORY_CARE_PROVIDER_SITE_OTHER): Payer: BLUE CROSS/BLUE SHIELD | Admitting: Physician Assistant

## 2016-04-06 VITALS — BP 118/72 | HR 86 | Temp 99.0°F | Resp 20 | Wt 181.0 lb

## 2016-04-06 DIAGNOSIS — R05 Cough: Secondary | ICD-10-CM

## 2016-04-06 DIAGNOSIS — R059 Cough, unspecified: Secondary | ICD-10-CM

## 2016-04-06 DIAGNOSIS — J0140 Acute pansinusitis, unspecified: Secondary | ICD-10-CM

## 2016-04-06 MED ORDER — AZITHROMYCIN 250 MG PO TABS: ORAL_TABLET | ORAL | Status: DC

## 2016-04-06 MED ORDER — HYDROCODONE-HOMATROPINE 5-1.5 MG/5ML PO SYRP
5.0000 mL | ORAL_SOLUTION | Freq: Three times a day (TID) | ORAL | Status: DC | PRN
Start: 2016-04-06 — End: 2016-05-09

## 2016-04-06 NOTE — Patient Instructions (Signed)

## 2016-04-06 NOTE — Progress Notes (Signed)
Patient ID: Kimberly Hudson, female   DOB: 26-Mar-1956, 60 y.o.   MRN: [id number]       Patient: Kimberly Hudson Female    DOB: September 21, 1956   60 y.o.   MRN: [id number] Visit Date: 04/06/2016  Today's Provider: Mar Daring, PA-C   Chief Complaint  Patient presents with  . Cough   Subjective:    HPI  Kimberly Hudson started to feel bad 2 days ago, May 18th. Symptoms include severe cough-productive with thick yellow phlegm, cough is keeping her up at night, fatigue, shortness of breath, decreased appetite and activity. She took Tessalon Perles capsule last night with no relief. No fever that she knows of. Chest muscles and ribs hurt from coughing a lot.She does get sinus infections during allergy season secondary to uncontrolled allergies. She does use Claritin for her seasonal allergies.    Allergies  Allergen Reactions  . Nsaids Other (See Comments)    Because of Crohns  . Sulfa Antibiotics Rash   Previous Medications   AZATHIOPRINE (IMURAN) 50 MG TABLET    TAKE 3 TABLETS (150 MG TOTAL) BY MOUTH DAILY.   CLONAZEPAM (KLONOPIN) 1 MG TABLET    TAKE 1 TABLET BY MOUTH AT BEDTIME   DICYCLOMINE (BENTYL) 20 MG TABLET    Take 1 tablet (20 mg total) by mouth 3 (three) times daily before meals.   DIPHENOXYLATE-ATROPINE (LOMOTIL) 2.5-0.025 MG TABLET    Take 1 tablet by mouth 4 (four) times daily as needed for diarrhea or loose stools.   HYDROCODONE-ACETAMINOPHEN (NORCO) 7.5-325 MG TABLET    Take 1 tablet by mouth every 6 (six) hours as needed for moderate pain.   HYDROCODONE-HOMATROPINE (HYCODAN) 5-1.5 MG/5ML SYRUP    Take 5 mLs by mouth every 8 (eight) hours as needed.   LISINOPRIL-HYDROCHLOROTHIAZIDE (PRINZIDE,ZESTORETIC) 10-12.5 MG TABLET    Take 1 tablet by mouth daily. PATIENT NEEDS TO SCHEDULE OFFICE VISIT FOR FOLLOW UP   PANTOPRAZOLE (PROTONIX) 40 MG TABLET    Take 1 tablet (40 mg total) by mouth daily.   PRAVASTATIN (PRAVACHOL) 10 MG TABLET    Take by mouth.   PROMETHAZINE (PHENERGAN) 25 MG  TABLET    Take 1 tablet (25 mg total) by mouth every 6 (six) hours as needed for nausea or vomiting.   SERTRALINE (ZOLOFT) 100 MG TABLET    Take 1.5 tablets by mouth daily.    Review of Systems  Constitutional: Positive for activity change, appetite change and fatigue. Negative for fever and chills.  Respiratory: Positive for cough, chest tightness and shortness of breath. Negative for wheezing.   Cardiovascular: Positive for chest pain (muscle pain of the chest wall from coughing a lot).  Gastrointestinal: Negative for nausea, vomiting and abdominal pain.  Musculoskeletal: Positive for myalgias, back pain and arthralgias.  Neurological: Positive for weakness and headaches. Negative for dizziness.    Social History  Substance Use Topics  . Smoking status: Former Smoker -- 1.00 packs/day for 10 years    Types: Cigarettes    Quit date: 11/18/2012  . Smokeless tobacco: Not on file  . Alcohol Use: 0.0 oz/week    0 Standard drinks or equivalent per week     Comment: every once in a while   Objective:   BP 118/72 mmHg  Pulse 86  Temp(Src) 99 F (37.2 C)  Resp 20  Wt 181 lb (82.101 kg)  SpO2 98%  Physical Exam  Constitutional: She appears well-developed and well-nourished. No distress.  HENT:  Head: Normocephalic and atraumatic.  Right Ear: Hearing, tympanic membrane, external ear and ear canal normal.  Left Ear: Hearing, external ear and ear canal normal. Tympanic membrane is not erythematous and not bulging. A middle ear effusion is present.  Nose: Mucosal edema and rhinorrhea present. Right sinus exhibits maxillary sinus tenderness and frontal sinus tenderness. Left sinus exhibits maxillary sinus tenderness and frontal sinus tenderness.  Mouth/Throat: Uvula is midline and mucous membranes are normal. Posterior oropharyngeal erythema present. No oropharyngeal exudate or posterior oropharyngeal edema.  Neck: Normal range of motion. Neck supple. No tracheal deviation present. No  thyromegaly present.  Cardiovascular: Normal rate, regular rhythm and normal heart sounds.  Exam reveals no gallop and no friction rub.   No murmur heard. Pulmonary/Chest: Effort normal and breath sounds normal. No stridor. No respiratory distress. She has no wheezes. She has no rales.  Lymphadenopathy:    She has no cervical adenopathy.  Skin: She is not diaphoretic.  Vitals reviewed.       Assessment & Plan:     1. Acute pansinusitis, recurrence not specified Worsening symptoms with fever that has not responded to over-the-counter medications. I will give her a Z-Pak as below because she has tolerated and responded to this well with her previous sinus infections. I will give her Hycodan cough syrup as below for nighttime cough. I did advise of drowsiness precautions with this medication. She is to use Mucinex DM during the day. She is to continue using her Claritin for allergies. She is to call symptoms fail to improve or worsen. - azithromycin (ZITHROMAX) 250 MG tablet; Take 2 tablets PO on day one, and one tablet PO daily thereafter until completed.  Dispense: 6 tablet; Refill: 0  2. Cough See above medical treatment plan. - HYDROcodone-homatropine (HYCODAN) 5-1.5 MG/5ML syrup; Take 5 mLs by mouth every 8 (eight) hours as needed.  Dispense: 180 mL; Refill: 0       Mar Daring, PA-C  [facility]

## 2016-04-16 ENCOUNTER — Other Ambulatory Visit: Payer: Self-pay | Admitting: *Deleted

## 2016-04-16 MED ORDER — HYDROCODONE-ACETAMINOPHEN 7.5-325 MG PO TABS: 1.0000 | ORAL_TABLET | Freq: Four times a day (QID) | ORAL | Status: DC | PRN

## 2016-04-19 ENCOUNTER — Other Ambulatory Visit: Payer: Self-pay | Admitting: Family Medicine

## 2016-04-19 MED ORDER — SERTRALINE HCL 100 MG PO TABS: 150.0000 mg | ORAL_TABLET | Freq: Every day | ORAL | Status: DC

## 2016-04-29 ENCOUNTER — Ambulatory Visit
Admission: RE | Admit: 2016-04-29 | Discharge: 2016-04-29 | Disposition: A | Discharge status: Home / Self Care | Payer: Disability Insurance | Source: Ambulatory Visit | Attending: Preventative Medicine | Admitting: Preventative Medicine

## 2016-04-29 ENCOUNTER — Other Ambulatory Visit: Payer: Self-pay | Admitting: Preventative Medicine

## 2016-04-29 DIAGNOSIS — M170 Bilateral primary osteoarthritis of knee: Secondary | ICD-10-CM | POA: Insufficient documentation

## 2016-04-29 DIAGNOSIS — M5136 Other intervertebral disc degeneration, lumbar region: Secondary | ICD-10-CM | POA: Insufficient documentation

## 2016-04-29 DIAGNOSIS — M19041 Primary osteoarthritis, right hand: Secondary | ICD-10-CM

## 2016-04-29 DIAGNOSIS — M810 Age-related osteoporosis without current pathological fracture: Secondary | ICD-10-CM | POA: Diagnosis not present

## 2016-04-29 DIAGNOSIS — M1611 Unilateral primary osteoarthritis, right hip: Secondary | ICD-10-CM

## 2016-05-01 ENCOUNTER — Other Ambulatory Visit: Payer: Self-pay | Admitting: Family Medicine

## 2016-05-01 MED ORDER — HYDROCODONE-ACETAMINOPHEN 7.5-325 MG PO TABS: 1.0000 | ORAL_TABLET | Freq: Four times a day (QID) | ORAL | Status: DC | PRN

## 2016-05-01 NOTE — Telephone Encounter (Signed)
Pt needs refill HYDROcodone-acetaminophen (NORCO) 7.5-325 MG tablet  Please call pt when ready to pick up [phone]  Thanks Con Memos

## 2016-05-09 ENCOUNTER — Other Ambulatory Visit: Payer: Self-pay | Admitting: Family Medicine

## 2016-05-09 ENCOUNTER — Encounter: Payer: Self-pay | Admitting: Family Medicine

## 2016-05-09 ENCOUNTER — Ambulatory Visit (INDEPENDENT_AMBULATORY_CARE_PROVIDER_SITE_OTHER): Payer: BLUE CROSS/BLUE SHIELD | Admitting: Family Medicine

## 2016-05-09 VITALS — BP 112/70 | HR 72 | Temp 98.6°F | Resp 16 | Ht 67.0 in | Wt 185.0 lb

## 2016-05-09 DIAGNOSIS — I10 Essential (primary) hypertension: Secondary | ICD-10-CM | POA: Diagnosis not present

## 2016-05-09 DIAGNOSIS — D649 Anemia, unspecified: Secondary | ICD-10-CM | POA: Diagnosis not present

## 2016-05-09 DIAGNOSIS — E785 Hyperlipidemia, unspecified: Secondary | ICD-10-CM

## 2016-05-09 DIAGNOSIS — M1711 Unilateral primary osteoarthritis, right knee: Secondary | ICD-10-CM

## 2016-05-09 DIAGNOSIS — Z0189 Encounter for other specified special examinations: Secondary | ICD-10-CM | POA: Diagnosis not present

## 2016-05-09 DIAGNOSIS — F32A Depression, unspecified: Secondary | ICD-10-CM

## 2016-05-09 DIAGNOSIS — Z0283 Encounter for blood-alcohol and blood-drug test: Secondary | ICD-10-CM

## 2016-05-09 DIAGNOSIS — F329 Major depressive disorder, single episode, unspecified: Secondary | ICD-10-CM

## 2016-05-09 MED ORDER — HYDROCODONE-ACETAMINOPHEN 7.5-325 MG PO TABS: 1.0000 | ORAL_TABLET | Freq: Four times a day (QID) | ORAL | Status: DC | PRN

## 2016-05-09 NOTE — Progress Notes (Signed)
Patient: Kimberly Hudson Female    DOB: 1956-03-26   60 y.o.   MRN: [id number] Visit Date: 05/09/2016  Today's Provider: Lelon Huh, MD   Chief Complaint  Patient presents with  . Follow-up  . Hypertension  . Depression  . Hyperlipidemia  . Insomnia   Subjective:    HPI  Restless leg syndrome: Continue clonazepam hs which is working well and helping somewhat with insomnia. No adverse effects.    Depression: Continue on sertraline which she is tolerating well. Feels it has helped and wishes to continue    Osteoarthritis of right knee: From 09/19/2014-no changes, continue current pain medications. Is taking hydrocodone/apap four times a day pretty much every day. Is contemplating knee replacement. Has not seen orthopedist for several years, last was at [facility].      Hypertension, follow-up:  BP Readings from Last 3 Encounters:  05/09/16 112/70  04/06/16 118/72  02/21/16 130/84    She was last seen for hypertension 03/21/2015.  BP at that visit was 104/74. Management since that visit includes; discontinued lisinopril/HCTZ to monotherapy thiazide diuretic HCTZ 12.5 mg qd.She reports good compliance with treatment, however has started taking every other day due to feeling light headed.  She is not having side effects. none  She is exercising. She is adherent to low salt diet.   Outside blood pressures are 117/62. She is experiencing none.  Patient denies none.   Cardiovascular risk factors include none.  Use of agents associated with hypertension: none.   ----------------------------------------------------------------------    Lipid/Cholesterol, Follow-up:   Last seen for this 01/11/2015.  Management since that visit includes; no changes.  Last Lipid Panel: No results found for: CHOL, TRIG, HDL, CHOLHDL, VLDL, LDLCALC, LDLDIRECT  She reports poor compliance with treatment. She ran out of pravastatin several months ago.  She is not having side effects.  none  Wt Readings from Last 3 Encounters:  05/09/16 185 lb (83.915 kg)  04/06/16 181 lb (82.101 kg)  02/21/16 190 lb 9.6 oz (86.456 kg)    ----------------------------------------------------------------------   Crohn's Disease  Continue on Imuran prescribed by Dr. Allen Norris which seems to control symptoms well.     Allergies  Allergen Reactions  . Nsaids Other (See Comments)    Because of Crohns  . Sulfa Antibiotics Rash   Current Meds  Medication Sig  . azaTHIOprine (IMURAN) 50 MG tablet TAKE 3 TABLETS (150 MG TOTAL) BY MOUTH DAILY.  . clonazePAM (KLONOPIN) 1 MG tablet TAKE 1 TABLET BY MOUTH AT BEDTIME  . dicyclomine (BENTYL) 20 MG tablet Take 1 tablet (20 mg total) by mouth 3 (three) times daily before meals.  . diphenoxylate-atropine (LOMOTIL) 2.5-0.025 MG tablet Take 1 tablet by mouth 4 (four) times daily as needed for diarrhea or loose stools.  Marland Kitchen HYDROcodone-acetaminophen (NORCO) 7.5-325 MG tablet Take 1 tablet by mouth every 6 (six) hours as needed for moderate pain.  Marland Kitchen lisinopril-hydrochlorothiazide (PRINZIDE,ZESTORETIC) 10-12.5 MG tablet Take 1 tablet by mouth daily. (TAKING ONE EVERY OTHER DAY)P  . pantoprazole (PROTONIX) 40 MG tablet Take 1 tablet (40 mg total) by mouth daily.  . pravastatin (PRAVACHOL) 10 MG tablet Take by mouth. (not taking)  . promethazine (PHENERGAN) 25 MG tablet Take 1 tablet (25 mg total) by mouth every 6 (six) hours as needed for nausea or vomiting.  . sertraline (ZOLOFT) 100 MG tablet Take 1.5 tablets (150 mg total) by mouth daily.    Review of Systems  Constitutional: Negative for fever, chills, appetite change  and fatigue.  Respiratory: Negative for chest tightness and shortness of breath.   Cardiovascular: Negative for chest pain and palpitations.  Gastrointestinal: Negative for nausea, vomiting and abdominal pain.  Neurological: Negative for dizziness and weakness.    Social History  Substance Use Topics  . Smoking status: Former Smoker  -- 1.00 packs/day for 10 years    Types: Cigarettes    Quit date: 11/18/2012  . Smokeless tobacco: Not on file  . Alcohol Use: 0.0 oz/week    0 Standard drinks or equivalent per week     Comment: every once in a while   Objective:   BP 112/70 mmHg  Pulse 72  Temp(Src) 98.6 F (37 C) (Oral)  Resp 16  Ht 5' 7"  (1.702 m)  Wt 185 lb (83.915 kg)  BMI 28.97 kg/m2  SpO2 96%  Physical Exam   General Appearance:    Alert, cooperative, no distress  Eyes:    PERRL, conjunctiva/corneas clear, EOM's intact       Lungs:     Clear to auscultation bilaterally, respirations unlabored  Heart:    Regular rate and rhythm  Neurologic:   Awake, alert, oriented x 3. No apparent focal neurological           defect.          Assessment & Plan:     1. Anemia, unspecified anemia type  - CBC  2. Depression Stable on current dose of sertraline  3. Hyperlipidemia Currently off of statin. Will check labs - Lipid panel - Hepatic function panel  4. Essential hypertension Only taking lisinopril-hctz QOD due to hyPOtensive symptoms. See how labs look - Renal function panel  5. Primary osteoarthritis of right knee In process of applying for disability. Pain is fairly well controlled on scheduled dose of hydrocodone/APAP, but physical activity remains limited as she can only walk short distance without having to stop to rest.   6. Encounter for drug screening  - [facility] (14 Drugs), Ur       Lelon Huh, MD  [place]

## 2016-05-10 LAB — LIPID PANEL
CHOL/HDL RATIO: 4.3 ratio (ref 0.0–4.4)
CHOLESTEROL TOTAL: 205 mg/dL — AB (ref 100–199)
HDL: 48 mg/dL (ref >39)
LDL Calculated: 121 mg/dL — ABNORMAL HIGH (ref 0–99)
Triglycerides: 182 mg/dL — ABNORMAL HIGH (ref 0–149)
VLDL CHOLESTEROL CAL: 36 mg/dL (ref 5–40)

## 2016-05-10 LAB — SPECIMEN STATUS REPORT

## 2016-05-10 LAB — HEPATIC FUNCTION PANEL
ALBUMIN: 3.2 g/dL — AB (ref 3.5–5.5)
ALT: 7 IU/L (ref 0–32)
AST: 15 IU/L (ref 0–40)
Alkaline Phosphatase: 99 IU/L (ref 39–117)
BILIRUBIN, DIRECT: 0.08 mg/dL (ref 0.00–0.40)
Bilirubin Total: 0.3 mg/dL (ref 0.0–1.2)
TOTAL PROTEIN: 6.3 g/dL (ref 6.0–8.5)

## 2016-05-10 LAB — CBC
Hematocrit: 25.2 % — ABNORMAL LOW (ref 34.0–46.6)
Hemoglobin: 8.1 g/dL — ABNORMAL LOW (ref 11.1–15.9)
MCH: 30.8 pg (ref 26.6–33.0)
MCHC: 32.1 g/dL (ref 31.5–35.7)
MCV: 96 fL (ref 79–97)
PLATELETS: 307 10*3/uL (ref 150–379)
RBC: 2.63 x10E6/uL — AB (ref 3.77–5.28)
RDW: 19 % — ABNORMAL HIGH (ref 12.3–15.4)
WBC: 3.4 10*3/uL (ref 3.4–10.8)

## 2016-05-10 LAB — RENAL FUNCTION PANEL
Albumin: 3.1 g/dL — ABNORMAL LOW (ref 3.5–5.5)
BUN/Creatinine Ratio: 13 (ref 9–23)
BUN: 12 mg/dL (ref 6–24)
CHLORIDE: 103 mmol/L (ref 96–106)
CO2: 27 mmol/L (ref 18–29)
Calcium: 8.8 mg/dL (ref 8.7–10.2)
Creatinine, Ser: 0.94 mg/dL (ref 0.57–1.00)
GFR calc Af Amer: 77 mL/min/{1.73_m2} (ref >59)
GFR, EST NON AFRICAN AMERICAN: 67 mL/min/{1.73_m2} (ref >59)
GLUCOSE: 99 mg/dL (ref 65–99)
PHOSPHORUS: 2.8 mg/dL (ref 2.5–4.5)
POTASSIUM: 4.4 mmol/L (ref 3.5–5.2)
Sodium: 143 mmol/L (ref 134–144)

## 2016-05-11 LAB — PAIN MGT SCRN (14 DRUGS), UR
AMPHETAMINE SCRN UR: NEGATIVE ng/mL
BARBITURATE SCRN UR: NEGATIVE ng/mL
BUPRENORPHINE, URINE: NEGATIVE ng/mL
Benzodiazepine Screen, Urine: POSITIVE ng/mL
Cannabinoids Ur Ql Scn: NEGATIVE ng/mL
Cocaine(Metab.)Screen, Urine: NEGATIVE ng/mL
Creatinine(Crt), U: 111.5 mg/dL (ref 20.0–300.0)
Fentanyl, Urine: NEGATIVE pg/mL
METHADONE SCREEN, URINE: NEGATIVE ng/mL
Meperidine Screen, Urine: NEGATIVE ng/mL
Opiate Scrn, Ur: POSITIVE ng/mL
Oxycodone+Oxymorphone Ur Ql Scn: NEGATIVE ng/mL
PCP SCRN UR: NEGATIVE ng/mL
PH UR, DRUG SCRN: 5.3 (ref 4.5–8.9)
Propoxyphene, Screen: NEGATIVE ng/mL
TRAMADOL UR QL SCN: NEGATIVE ng/mL

## 2016-05-13 NOTE — Progress Notes (Signed)
Contacted [facility] and verbally requested additional test listed below be added. Test have been added. Awaiting confirmation from [facility] to see if test can be added.

## 2016-05-14 ENCOUNTER — Telehealth: Payer: Self-pay

## 2016-05-14 LAB — SPECIMEN STATUS REPORT

## 2016-05-14 LAB — B12 AND FOLATE PANEL
FOLATE: 8.1 ng/mL (ref >3.0)
Vitamin B-12: 234 pg/mL (ref 211–946)

## 2016-05-14 LAB — FERRITIN: FERRITIN: 15 ng/mL (ref 15–150)

## 2016-05-14 NOTE — Telephone Encounter (Signed)
-----   Message from Birdie Sons, MD sent at 05/14/2016  8:06 AM EDT ----- Patient is very anemic with hemoglobin of 8.1. This is partly from Imuran, but she is also very iron deficient. She needs to start taking iron sulfate 346m twice a day and send copy of labs to her rheumatologist.

## 2016-05-14 NOTE — Telephone Encounter (Signed)
Sorry, I meant to say send copy of labs to her GI who is prescribing the Imuran.

## 2016-05-14 NOTE — Telephone Encounter (Signed)
Patient advised and verbally voiced understanding. Patient agrees to start taking Iron Sulfate as stated below. Patient says she does not have a Rheumatologist. She wanted to talk to you about that when you returned from vacation. She wants to know who you recommend.

## 2016-05-14 NOTE — Telephone Encounter (Signed)
Results were faxed.

## 2016-05-22 ENCOUNTER — Other Ambulatory Visit: Payer: Self-pay | Admitting: *Deleted

## 2016-05-22 MED ORDER — HYDROCODONE-ACETAMINOPHEN 7.5-325 MG PO TABS: 1.0000 | ORAL_TABLET | Freq: Four times a day (QID) | ORAL | Status: DC | PRN

## 2016-06-03 ENCOUNTER — Other Ambulatory Visit: Payer: Self-pay

## 2016-06-04 ENCOUNTER — Ambulatory Visit (INDEPENDENT_AMBULATORY_CARE_PROVIDER_SITE_OTHER): Payer: BLUE CROSS/BLUE SHIELD | Admitting: Gastroenterology

## 2016-06-04 ENCOUNTER — Other Ambulatory Visit: Payer: Self-pay | Admitting: *Deleted

## 2016-06-04 ENCOUNTER — Encounter: Payer: Self-pay | Admitting: Gastroenterology

## 2016-06-04 VITALS — BP 125/72 | HR 76 | Temp 98.6°F | Ht 67.0 in | Wt 184.4 lb

## 2016-06-04 DIAGNOSIS — K5090 Crohn's disease, unspecified, without complications: Secondary | ICD-10-CM

## 2016-06-04 DIAGNOSIS — R4702 Dysphasia: Secondary | ICD-10-CM | POA: Diagnosis not present

## 2016-06-04 MED ORDER — HYDROCODONE-ACETAMINOPHEN 7.5-325 MG PO TABS: 1.0000 | ORAL_TABLET | Freq: Four times a day (QID) | ORAL | Status: DC | PRN

## 2016-06-04 MED ORDER — PROMETHAZINE HCL 25 MG PO TABS: 25.0000 mg | ORAL_TABLET | Freq: Four times a day (QID) | ORAL | Status: DC | PRN

## 2016-06-04 NOTE — Progress Notes (Signed)
Primary Care Physician: Lelon Huh, MD  Primary Gastroenterologist:  Dr. Lucilla Lame  Chief Complaint  Patient presents with  . Crohn's Disease  . Dysphagia    HPI: Kimberly Hudson is a 60 y.o. female here for follow-up of her Crohn's disease and dysphagia.  The patient had an EGD previously with dilation to 15 mm and was told to come back.  The patient never came back for repeat esophageal dilation.  She now continues to have problems with swallowing but is waiting for her disability insurance to kick in.  The patient is here for follow-up and states that she has flares of her Crohn's disease that presented as mid epigastric pain that last approximately 1 day.  She denies any black stools or bloody stools.  She also denies any diarrhea at the present time.  Current Outpatient Prescriptions  Medication Sig Dispense Refill  . azaTHIOprine (IMURAN) 50 MG tablet TAKE 3 TABLETS (150 MG TOTAL) BY MOUTH DAILY. 90 tablet 0  . clonazePAM (KLONOPIN) 1 MG tablet TAKE 1 TABLET BY MOUTH AT BEDTIME 20 tablet 5  . dicyclomine (BENTYL) 20 MG tablet Take 1 tablet (20 mg total) by mouth 3 (three) times daily before meals. 90 tablet 5  . diphenoxylate-atropine (LOMOTIL) 2.5-0.025 MG tablet Take 1 tablet by mouth 4 (four) times daily as needed for diarrhea or loose stools. 120 tablet 5  . HYDROcodone-acetaminophen (NORCO) 7.5-325 MG tablet Take 1 tablet by mouth every 6 (six) hours as needed for moderate pain. 60 tablet 0  . lisinopril-hydrochlorothiazide (PRINZIDE,ZESTORETIC) 10-12.5 MG tablet Take 1 tablet by mouth daily. PATIENT NEEDS TO SCHEDULE OFFICE VISIT FOR FOLLOW UP 90 tablet 0  . pantoprazole (PROTONIX) 40 MG tablet Take 1 tablet (40 mg total) by mouth daily. 90 tablet 2  . promethazine (PHENERGAN) 25 MG tablet Take 1 tablet (25 mg total) by mouth every 6 (six) hours as needed for nausea or vomiting. 30 tablet 3  . sertraline (ZOLOFT) 100 MG tablet Take 1.5 tablets (150 mg total) by mouth daily.  135 tablet 4  . pravastatin (PRAVACHOL) 10 MG tablet Take by mouth. Reported on 06/04/2016     No current facility-administered medications for this visit.    Allergies as of 06/04/2016 - Review Complete 06/04/2016  Allergen Reaction Noted  . Nsaids Other (See Comments) 06/19/2015  . Sulfa antibiotics Rash 04/27/2015    ROS:  General: Negative for anorexia, weight loss, fever, chills, fatigue, weakness. ENT: Negative for hoarseness, difficulty swallowing , nasal congestion. CV: Negative for chest pain, angina, palpitations, dyspnea on exertion, peripheral edema.  Respiratory: Negative for dyspnea at rest, dyspnea on exertion, cough, sputum, wheezing.  GI: See history of present illness. GU:  Negative for dysuria, hematuria, urinary incontinence, urinary frequency, nocturnal urination.  Endo: Negative for unusual weight change.    Physical Examination:   BP 125/72 mmHg  Pulse 76  Temp(Src) 98.6 F (37 C) (Oral)  Ht 5' 7"  (1.702 m)  Wt 184 lb 6.4 oz (83.643 kg)  BMI 28.87 kg/m2  General: Well-nourished, well-developed in no acute distress.  Eyes: No icterus. Conjunctivae pink. Mouth: Oropharyngeal mucosa moist and pink , no lesions erythema or exudate. Lungs: Clear to auscultation bilaterally. Non-labored. Heart: Regular rate and rhythm, no murmurs rubs or gallops.  Abdomen: Bowel sounds are normal, nontender, nondistended, no hepatosplenomegaly or masses, no abdominal bruits or hernia , no rebound or guarding.   Extremities: No lower extremity edema. No clubbing or deformities. Neuro: Alert and oriented x 3.  Grossly intact. Skin: Warm and dry, no jaundice.   Psych: Alert and cooperative, normal mood and affect.  Labs:    Imaging Studies: No results found.  Assessment and Plan:   Kimberly Hudson is a 60 y.o. y/o female Has a history of Crohn's disease and dysphagia.  The patient states that she watches what she eats and has not had any food get stuck that she wasn't able  to get to come up or down.  The patient has been told to have a repeat EGD but she would like to wait until her disability insurance kicks in.  The patient has been stable on her inflammatory bowel disease and will be given a refill of her nausea medication.  The patient has been explained the plan and agrees with it.   Note: This dictation was prepared with Dragon dictation along with smaller phrase technology. Any transcriptional errors that result from this process are unintentional.

## 2016-06-17 ENCOUNTER — Other Ambulatory Visit: Payer: Self-pay

## 2016-06-17 NOTE — Telephone Encounter (Signed)
Patient sent an email requesting refill of medication. 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

## 2016-06-18 MED ORDER — HYDROCODONE-ACETAMINOPHEN 7.5-325 MG PO TABS: 1.0000 | ORAL_TABLET | Freq: Four times a day (QID) | ORAL | 0 refills | Status: DC | PRN

## 2016-06-25 ENCOUNTER — Other Ambulatory Visit: Payer: Self-pay | Admitting: Family Medicine

## 2016-06-26 NOTE — Telephone Encounter (Signed)
Please call in clonazepam

## 2016-06-26 NOTE — Telephone Encounter (Signed)
Rx called in to pharmacy. 

## 2016-07-01 ENCOUNTER — Other Ambulatory Visit: Payer: Self-pay

## 2016-07-01 MED ORDER — HYDROCODONE-ACETAMINOPHEN 7.5-325 MG PO TABS: 1.0000 | ORAL_TABLET | Freq: Four times a day (QID) | ORAL | 0 refills | Status: DC | PRN

## 2016-07-01 NOTE — Telephone Encounter (Signed)
Pt requesting refill. Last refilled on 06/18/2016

## 2016-07-12 ENCOUNTER — Other Ambulatory Visit: Payer: Self-pay

## 2016-07-12 MED ORDER — HYDROCODONE-ACETAMINOPHEN 7.5-325 MG PO TABS: 1.0000 | ORAL_TABLET | Freq: Four times a day (QID) | ORAL | 0 refills | Status: DC | PRN

## 2016-07-12 NOTE — Telephone Encounter (Signed)
Patient request refill

## 2016-07-25 ENCOUNTER — Other Ambulatory Visit: Payer: Self-pay | Admitting: *Deleted

## 2016-07-25 MED ORDER — LISINOPRIL-HYDROCHLOROTHIAZIDE 10-12.5 MG PO TABS: 1.0000 | ORAL_TABLET | Freq: Every day | ORAL | 4 refills | Status: DC

## 2016-07-26 MED ORDER — HYDROCODONE-ACETAMINOPHEN 7.5-325 MG PO TABS: 1.0000 | ORAL_TABLET | Freq: Four times a day (QID) | ORAL | 0 refills | Status: DC | PRN

## 2016-07-29 ENCOUNTER — Telehealth: Payer: Self-pay | Admitting: Gastroenterology

## 2016-07-29 ENCOUNTER — Other Ambulatory Visit: Payer: Self-pay

## 2016-07-29 NOTE — Telephone Encounter (Signed)
Gastroenterology Pre-Procedure Review  Request Date: 08/05/2016 Requesting Physician: Dr. Caryn Section  PATIENT REVIEW QUESTIONS: The patient responded to the following health history questions as indicated:    1. Are you having any GI issues? no 2. Do you have a personal history of Polyps? no 3. Do you have a family history of Colon Cancer or Polyps? no 4. Diabetes Mellitus? no 5. Joint replacements in the past 12 months?no 6. Major health problems in the past 3 months?no 7. Any artificial heart valves, MVP, or defibrillator?no    MEDICATIONS & ALLERGIES:    Patient reports the following regarding taking any anticoagulation/antiplatelet therapy:   Plavix, Coumadin, Eliquis, Xarelto, Lovenox, Pradaxa, Brilinta, or Effient? no Aspirin? no  Patient confirms/reports the following medications:  Current Outpatient Prescriptions  Medication Sig Dispense Refill  . azaTHIOprine (IMURAN) 50 MG tablet TAKE 3 TABLETS (150 MG TOTAL) BY MOUTH DAILY. 90 tablet 0  . clonazePAM (KLONOPIN) 1 MG tablet TAKE 1 TABELT BY MOUTH AT BEDTIME 30 tablet 2  . dicyclomine (BENTYL) 20 MG tablet Take 1 tablet (20 mg total) by mouth 3 (three) times daily before meals. 90 tablet 5  . diphenoxylate-atropine (LOMOTIL) 2.5-0.025 MG tablet Take 1 tablet by mouth 4 (four) times daily as needed for diarrhea or loose stools. 120 tablet 5  . fluconazole (DIFLUCAN) 100 MG tablet TAKE 1 TABLET (100 MG TOTAL) BY MOUTH DAILY. FOR 2 WEEKS  3  . HYDROcodone-acetaminophen (NORCO) 7.5-325 MG tablet Take 1 tablet by mouth every 6 (six) hours as needed for moderate pain. 60 tablet 0  . lisinopril-hydrochlorothiazide (PRINZIDE,ZESTORETIC) 10-12.5 MG tablet Take 1 tablet by mouth daily. 90 tablet 4  . pantoprazole (PROTONIX) 40 MG tablet Take 1 tablet (40 mg total) by mouth daily. 90 tablet 2  . promethazine (PHENERGAN) 25 MG tablet Take 1 tablet (25 mg total) by mouth every 6 (six) hours as needed for nausea or vomiting. 30 tablet 3  .  sertraline (ZOLOFT) 100 MG tablet Take 1.5 tablets (150 mg total) by mouth daily. 135 tablet 4   No current facility-administered medications for this visit.     Patient confirms/reports the following allergies:  Allergies  Allergen Reactions  . Nsaids Other (See Comments)    Because of Crohns  . Sulfa Antibiotics Rash    No orders of the defined types were placed in this encounter.   AUTHORIZATION INFORMATION Primary Insurance: 1D#: Group #:  Secondary Insurance: 1D#: Group #:  SCHEDULE INFORMATION: Date: 08/05/2016 Time: Location: [facility]

## 2016-07-29 NOTE — Telephone Encounter (Signed)
Dysphagia R13.10 [facility] 08/05/2016 Medicaid (prior Josem Kaufmann is not required)

## 2016-07-29 NOTE — Telephone Encounter (Signed)
EGD

## 2016-07-30 ENCOUNTER — Ambulatory Visit: Payer: Medicaid Other | Admitting: Family Medicine

## 2016-08-02 NOTE — Discharge Instructions (Signed)

## 2016-08-05 ENCOUNTER — Ambulatory Visit: Payer: BLUE CROSS/BLUE SHIELD | Admitting: Anesthesiology

## 2016-08-05 ENCOUNTER — Encounter
Admission: RE | Disposition: A | Discharge status: Home / Self Care | Payer: Self-pay | Source: Ambulatory Visit | Attending: Gastroenterology

## 2016-08-05 ENCOUNTER — Ambulatory Visit
Admission: RE | Admit: 2016-08-05 | Discharge: 2016-08-05 | Disposition: A | Discharge status: Home / Self Care | Payer: BLUE CROSS/BLUE SHIELD | Source: Ambulatory Visit | Attending: Gastroenterology | Admitting: Gastroenterology

## 2016-08-05 DIAGNOSIS — F329 Major depressive disorder, single episode, unspecified: Secondary | ICD-10-CM | POA: Insufficient documentation

## 2016-08-05 DIAGNOSIS — Z87891 Personal history of nicotine dependence: Secondary | ICD-10-CM | POA: Diagnosis not present

## 2016-08-05 DIAGNOSIS — K222 Esophageal obstruction: Secondary | ICD-10-CM | POA: Diagnosis not present

## 2016-08-05 DIAGNOSIS — I10 Essential (primary) hypertension: Secondary | ICD-10-CM | POA: Insufficient documentation

## 2016-08-05 DIAGNOSIS — R1310 Dysphagia, unspecified: Secondary | ICD-10-CM | POA: Insufficient documentation

## 2016-08-05 DIAGNOSIS — Z79899 Other long term (current) drug therapy: Secondary | ICD-10-CM | POA: Diagnosis not present

## 2016-08-05 DIAGNOSIS — I2510 Atherosclerotic heart disease of native coronary artery without angina pectoris: Secondary | ICD-10-CM | POA: Diagnosis not present

## 2016-08-05 DIAGNOSIS — K219 Gastro-esophageal reflux disease without esophagitis: Secondary | ICD-10-CM | POA: Insufficient documentation

## 2016-08-05 DIAGNOSIS — K5090 Crohn's disease, unspecified, without complications: Secondary | ICD-10-CM | POA: Insufficient documentation

## 2016-08-05 HISTORY — DX: Gastro-esophageal reflux disease without esophagitis: K21.9

## 2016-08-05 HISTORY — PX: ESOPHAGOGASTRODUODENOSCOPY (EGD) WITH PROPOFOL: SHX5813

## 2016-08-05 SURGERY — ESOPHAGOGASTRODUODENOSCOPY (EGD) WITH PROPOFOL
Anesthesia: Monitor Anesthesia Care

## 2016-08-05 MED ORDER — LIDOCAINE HCL (CARDIAC) 20 MG/ML IV SOLN
INTRAVENOUS | Status: DC | PRN
Start: 2016-08-05 — End: 2016-08-05
  Administered 2016-08-05: 30 mg via INTRAVENOUS

## 2016-08-05 MED ORDER — LACTATED RINGERS IV SOLN
INTRAVENOUS | Status: DC
  Administered 2016-08-05: 10:00:00 via INTRAVENOUS

## 2016-08-05 MED ORDER — PROPOFOL 10 MG/ML IV BOLUS
INTRAVENOUS | Status: DC | PRN
  Administered 2016-08-05: 100 mg via INTRAVENOUS

## 2016-08-05 MED ORDER — SIMETHICONE 40 MG/0.6ML PO SUSP
ORAL | Status: DC | PRN
  Administered 2016-08-05: 11:00:00

## 2016-08-05 MED ORDER — GLYCOPYRROLATE 0.2 MG/ML IJ SOLN
INTRAMUSCULAR | Status: DC | PRN
  Administered 2016-08-05: 0.1 mg via INTRAVENOUS

## 2016-08-05 SURGICAL SUPPLY — 32 items
BALLN DILATOR 10-12 8 (BALLOONS) ×1
BALLN DILATOR 12-15 8 (BALLOONS)
BALLN DILATOR 15-18 8 (BALLOONS)
BALLN DILATOR CRE 0-12 8 (BALLOONS) ×1
BALLN DILATOR ESOPH 8 10 CRE (MISCELLANEOUS) IMPLANT
BALLOON DILATOR 12-15 8 (BALLOONS) IMPLANT
BALLOON DILATOR 15-18 8 (BALLOONS) IMPLANT
BALLOON DILATOR CRE 0-12 8 (BALLOONS) IMPLANT
BLOCK BITE 60FR ADLT L/F GRN (MISCELLANEOUS) ×2 IMPLANT
CANISTER SUCT 1200ML W/VALVE (MISCELLANEOUS) ×2 IMPLANT
CLIP HMST 235XBRD CATH ROT (MISCELLANEOUS) IMPLANT
CLIP RESOLUTION 360 11X235 (MISCELLANEOUS)
FCP ESCP3.2XJMB 240X2.8X (MISCELLANEOUS)
FORCEPS BIOP RAD 4 LRG CAP 4 (CUTTING FORCEPS) IMPLANT
FORCEPS BIOP RJ4 240 W/NDL (MISCELLANEOUS)
FORCEPS ESCP3.2XJMB 240X2.8X (MISCELLANEOUS) IMPLANT
GOWN CVR UNV OPN BCK APRN NK (MISCELLANEOUS) ×2 IMPLANT
GOWN ISOL THUMB LOOP REG UNIV (MISCELLANEOUS) ×4
INJECTOR VARIJECT VIN23 (MISCELLANEOUS) IMPLANT
KIT DEFENDO VALVE AND CONN (KITS) IMPLANT
KIT ENDO PROCEDURE OLY (KITS) ×2 IMPLANT
MARKER SPOT ENDO TATTOO 5ML (MISCELLANEOUS) IMPLANT
PAD GROUND ADULT SPLIT (MISCELLANEOUS) IMPLANT
RETRIEVER NET PLAT FOOD (MISCELLANEOUS) IMPLANT
SNARE SHORT THROW 13M SML OVAL (MISCELLANEOUS) IMPLANT
SNARE SHORT THROW 30M LRG OVAL (MISCELLANEOUS) IMPLANT
SPOT EX ENDOSCOPIC TATTOO (MISCELLANEOUS)
SYR INFLATION 60ML (SYRINGE) IMPLANT
TRAP ETRAP POLY (MISCELLANEOUS) IMPLANT
VARIJECT INJECTOR VIN23 (MISCELLANEOUS)
WATER STERILE IRR 250ML POUR (IV SOLUTION) ×2 IMPLANT
WIRE CRE 18-20MM 8CM F G (MISCELLANEOUS) IMPLANT

## 2016-08-05 NOTE — Anesthesia Postprocedure Evaluation (Signed)
Anesthesia Post Note  Patient: Kimberly Hudson  Procedure(s) Performed: Procedure(s) (LRB): ESOPHAGOGASTRODUODENOSCOPY (EGD) WITH PROPOFOL (N/A)  Patient location during evaluation: PACU Anesthesia Type: MAC Level of consciousness: awake and alert Pain management: pain level controlled Vital Signs Assessment: post-procedure vital signs reviewed and stable Respiratory status: spontaneous breathing, nonlabored ventilation, respiratory function stable and patient connected to nasal cannula oxygen Cardiovascular status: stable and blood pressure returned to baseline Anesthetic complications: no    Alisa Graff

## 2016-08-05 NOTE — Anesthesia Preprocedure Evaluation (Signed)
Anesthesia Evaluation  Patient identified by MRN, date of birth, ID band Patient awake    Reviewed: Allergy & Precautions, H&P , NPO status , Patient's Chart, lab work & pertinent test results, reviewed documented beta blocker date and time   History of Anesthesia Complications (+) history of anesthetic complications (propofol causes HAs)  Airway Mallampati: II  TM Distance: >3 FB Neck ROM: full    Dental  (+) Upper Dentures   Pulmonary neg pulmonary ROS, former smoker,    Pulmonary exam normal breath sounds clear to auscultation       Cardiovascular Exercise Tolerance: Good hypertension, + CAD   Rhythm:regular Rate:Normal     Neuro/Psych PSYCHIATRIC DISORDERS (depression)  Neuromuscular disease (Restless leg syndrome) negative neurological ROS     GI/Hepatic Neg liver ROS, GERD  ,Crohn's disease, esophageal stricture/candidiasis   Endo/Other  negative endocrine ROS  Renal/GU negative Renal ROS  negative genitourinary   Musculoskeletal   Abdominal   Peds  Hematology  (+) anemia ,   Anesthesia Other Findings   Reproductive/Obstetrics negative OB ROS                             Anesthesia Physical Anesthesia Plan  ASA: III  Anesthesia Plan: MAC   Post-op Pain Management:    Induction:   Airway Management Planned:   Additional Equipment:   Intra-op Plan:   Post-operative Plan:   Informed Consent: I have reviewed the patients History and Physical, chart, labs and discussed the procedure including the risks, benefits and alternatives for the proposed anesthesia with the patient or authorized representative who has indicated his/her understanding and acceptance.   Dental Advisory Given  Plan Discussed with: CRNA  Anesthesia Plan Comments:         Anesthesia Quick Evaluation

## 2016-08-05 NOTE — Op Note (Signed)
[facility] Gastroenterology Patient Name: Kimberly Hudson Procedure Date: 08/05/2016 11:01 AM MRN: [id number] Account #: [id number] Date of Birth: Aug 18, 1956 Admit Type: Outpatient Age: 60 Room: [facility] OR ROOM 01 Gender: Female Note Status: Finalized Procedure:            Upper GI endoscopy Indications:          Dysphagia Providers:            Lucilla Lame MD, MD Referring MD:         Kirstie Peri. Caryn Section, MD (Referring MD) Medicines:            Propofol per Anesthesia Complications:        No immediate complications. Procedure:            Pre-Anesthesia Assessment:                       - Prior to the procedure, a History and Physical was                        performed, and patient medications and allergies were                        reviewed. The patient's tolerance of previous                        anesthesia was also reviewed. The risks and benefits of                        the procedure and the sedation options and risks were                        discussed with the patient. All questions were                        answered, and informed consent was obtained. Prior                        Anticoagulants: The patient has taken no previous                        anticoagulant or antiplatelet agents. ASA Grade                        Assessment: II - A patient with mild systemic disease.                        After reviewing the risks and benefits, the patient was                        deemed in satisfactory condition to undergo the                        procedure.                       After obtaining informed consent, the endoscope was                        passed under direct vision. Throughout the procedure,  the patient's blood pressure, pulse, and oxygen                        saturations were monitored continuously. The Olympus                        GIF H180J colonscope (Z#:[id number]) was introduced                        through the  mouth, and advanced to the second part of                        duodenum. The upper GI endoscopy was accomplished                        without difficulty. The patient tolerated the procedure                        well. Findings:      One severe benign-appearing, intrinsic stenosis was found in the upper       third of the esophagus. And was traversed. A TTS dilator was passed       through the scope. Dilation with a 08-29-11 mm balloon dilator was       performed to 12 mm. The dilation site was examined following endoscope       reinsertion and showed moderate improvement in luminal narrowing.      The stomach was normal.      The examined duodenum was normal. Impression:           - Benign-appearing esophageal stenosis. Dilated.                       - Normal stomach.                       - Normal examined duodenum.                       - No specimens collected. Recommendation:       - Repeat upper endoscopy in 6 weeks for retreatment. Procedure Code(s):    --- Professional ---                       [phone], Esophagogastroduodenoscopy, flexible, transoral;                        with transendoscopic balloon dilation of esophagus                        (less than 30 mm diameter) Diagnosis Code(s):    --- Professional ---                       R13.10, Dysphagia, unspecified                       K22.2, Esophageal obstruction CPT copyright 2016 American Medical Association. All rights reserved. The codes documented in this report are preliminary and upon coder review may  be revised to meet current compliance requirements. Lucilla Lame MD, MD 08/05/2016 11:12:54 AM This report has been signed electronically. Number of Addenda: 0 Note Initiated On: 08/05/2016 11:01 AM Total Procedure Duration: 0 hours  5 minutes 26 seconds       [facility]

## 2016-08-05 NOTE — Transfer of Care (Signed)
Immediate Anesthesia Transfer of Care Note  Patient: Kimberly Hudson  Procedure(s) Performed: Procedure(s): ESOPHAGOGASTRODUODENOSCOPY (EGD) WITH PROPOFOL (N/A)  Patient Location: PACU  Anesthesia Type: MAC  Level of Consciousness: awake, alert  and patient cooperative  Airway and Oxygen Therapy: Patient Spontanous Breathing and Patient connected to supplemental oxygen  Post-op Assessment: Post-op Vital signs reviewed, Patient's Cardiovascular Status Stable, Respiratory Function Stable, Patent Airway and No signs of Nausea or vomiting  Post-op Vital Signs: Reviewed and stable  Complications: No apparent anesthesia complications

## 2016-08-05 NOTE — H&P (Signed)
Lucilla Lame, MD [facility] [address], [place], [place] [address] Phone: [phone] Fax : [phone]  Primary Care Physician:  Lelon Huh, MD Primary Gastroenterologist:  Dr. Allen Norris  Pre-Procedure History & Physical: HPI:  Kimberly Hudson is a 60 y.o. female is here for an endoscopy.   Past Medical History:  Diagnosis Date  . Anemia    past hx  . Arthritis    osteo - shoulders, hips, knees  . Complication of anesthesia    propofol causes headaches  . Coronary artery disease   . Crohn's disease ([place])   . GERD (gastroesophageal reflux disease)   . History of chicken pox   . History of measles   . History of mumps   . Hypertension   . 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 Wears dentures    full upper    Past Surgical History:  Procedure Laterality Date  . COLONOSCOPY  02/2013   No polyps were found  . ESOPHAGOGASTRODUODENOSCOPY (EGD) WITH PROPOFOL N/A 06/28/2015   Procedure: ESOPHAGOGASTRODUODENOSCOPY (EGD) WITH PROPOFOL;  Surgeon: Lucilla Lame, MD;  Location: [place];  Service: Endoscopy;  Laterality: N/A;  with dialation  . ESOPHAGOGASTRODUODENOSCOPY (EGD) WITH PROPOFOL N/A 07/26/2015   Procedure: ESOPHAGOGASTRODUODENOSCOPY (EGD) WITH PROPOFOL, with dialation;  Surgeon: Lucilla Lame, MD;  Location: [place];  Service: Endoscopy;  Laterality: N/A;  LEAVE PT LAST  . Excision BCC  07/28/2013   right nares, Georgiann Cocker  . TUBAL LIGATION  1986  . UPPER GI ENDOSCOPY  02/2014    Prior to Admission medications   Medication Sig Start Date End Date Taking? Authorizing Provider  azaTHIOprine (IMURAN) 50 MG tablet TAKE 3 TABLETS (150 MG TOTAL) BY MOUTH DAILY. 08/16/15  Yes Lucilla Lame, MD  clonazePAM (KLONOPIN) 1 MG tablet TAKE 1 TABELT BY MOUTH AT BEDTIME 06/26/16  Yes Birdie Sons, MD  dicyclomine (BENTYL) 20 MG tablet Take 1 tablet (20 mg total) by mouth 3 (three) times daily before meals. 01/09/16  Yes Lucilla Lame, MD    diphenoxylate-atropine (LOMOTIL) 2.5-0.025 MG tablet Take 1 tablet by mouth 4 (four) times daily as needed for diarrhea or loose stools. 01/09/16  Yes Vinayak Bobier Allen Norris, MD  fluconazole (DIFLUCAN) 100 MG tablet TAKE 1 TABLET (100 MG TOTAL) BY MOUTH DAILY. FOR 2 WEEKS 06/25/16  Yes Historical Provider, MD  HYDROcodone-acetaminophen (NORCO) 7.5-325 MG tablet Take 1 tablet by mouth every 6 (six) hours as needed for moderate pain. 07/26/16  Yes Birdie Sons, MD  lisinopril-hydrochlorothiazide (PRINZIDE,ZESTORETIC) 10-12.5 MG tablet Take 1 tablet by mouth daily. 07/25/16  Yes Birdie Sons, MD  pantoprazole (PROTONIX) 40 MG tablet Take 1 tablet (40 mg total) by mouth daily. 01/15/16  Yes Birdie Sons, MD  promethazine (PHENERGAN) 25 MG tablet Take 1 tablet (25 mg total) by mouth every 6 (six) hours as needed for nausea or vomiting. 06/04/16  Yes Lucilla Lame, MD  sertraline (ZOLOFT) 100 MG tablet Take 1.5 tablets (150 mg total) by mouth daily. 04/19/16  Yes Birdie Sons, MD    Allergies as of 07/29/2016 - Review Complete 07/29/2016  Allergen Reaction Noted  . Nsaids Other (See Comments) 06/19/2015  . Sulfa antibiotics Rash 04/27/2015    Family History  Problem Relation Age of Onset  . COPD Mother   . Heart disease Mother   . COPD Father   . Emphysema Father   . Prostate cancer Father   . COPD Sister   . Heart disease Sister  Social History   Social History  . Marital status: Widowed    Spouse name: N/A  . Number of children: N/A  . Years of education: N/A   Occupational History  . Cashier    Social History Main Topics  . Smoking status: Former Smoker    Packs/day: 1.00    Years: 10.00    Types: Cigarettes    Quit date: 11/18/2012  . Smokeless tobacco: Never Used  . Alcohol use 0.0 oz/week     Comment: every once in a while  . Drug use: No  . Sexual activity: Not Currently   Other Topics Concern  . Not on file   Social History Narrative  . No narrative on file    Review  of Systems: See HPI, otherwise negative ROS  Physical Exam: BP (!) 117/58   Pulse 71   Temp 97.1 F (36.2 C) (Temporal)   Resp 16   Ht 5' 7"  (1.702 m)   Wt 181 lb (82.1 kg)   SpO2 97%   BMI 28.35 kg/m  General:   Alert,  pleasant and cooperative in NAD Head:  Normocephalic and atraumatic. Neck:  Supple; no masses or thyromegaly. Lungs:  Clear throughout to auscultation.    Heart:  Regular rate and rhythm. Abdomen:  Soft, nontender and nondistended. Normal bowel sounds, without guarding, and without rebound.   Neurologic:  Alert and  oriented x4;  grossly normal neurologically.  Impression/Plan: Kimberly Hudson is here for an endoscopy to be performed for dysphagia  Risks, benefits, limitations, and alternatives regarding  endoscopy have been reviewed with the patient.  Questions have been answered.  All parties agreeable.   Lucilla Lame, MD  08/05/2016, 9:45 AM

## 2016-08-06 ENCOUNTER — Other Ambulatory Visit: Payer: Self-pay | Admitting: Family Medicine

## 2016-08-06 ENCOUNTER — Encounter: Payer: Self-pay | Admitting: Gastroenterology

## 2016-08-06 MED ORDER — HYDROCODONE-ACETAMINOPHEN 7.5-325 MG PO TABS: 1.0000 | ORAL_TABLET | Freq: Four times a day (QID) | ORAL | 0 refills | Status: DC | PRN

## 2016-08-13 ENCOUNTER — Other Ambulatory Visit: Payer: Self-pay | Admitting: Gastroenterology

## 2016-08-13 DIAGNOSIS — K50919 Crohn's disease, unspecified, with unspecified complications: Secondary | ICD-10-CM

## 2016-08-20 ENCOUNTER — Other Ambulatory Visit: Payer: Self-pay | Admitting: *Deleted

## 2016-08-20 MED ORDER — HYDROCODONE-ACETAMINOPHEN 7.5-325 MG PO TABS: 1.0000 | ORAL_TABLET | Freq: Four times a day (QID) | ORAL | 0 refills | Status: DC | PRN

## 2016-08-21 ENCOUNTER — Encounter: Payer: Self-pay | Admitting: Family Medicine

## 2016-08-21 ENCOUNTER — Ambulatory Visit (INDEPENDENT_AMBULATORY_CARE_PROVIDER_SITE_OTHER): Payer: BLUE CROSS/BLUE SHIELD | Admitting: Family Medicine

## 2016-08-21 VITALS — BP 122/80 | HR 78 | Temp 98.5°F | Resp 16 | Wt 191.0 lb

## 2016-08-21 DIAGNOSIS — Z23 Encounter for immunization: Secondary | ICD-10-CM

## 2016-08-21 DIAGNOSIS — D224 Melanocytic nevi of scalp and neck: Secondary | ICD-10-CM

## 2016-08-21 NOTE — Progress Notes (Signed)
Patient: Kimberly Hudson Female    DOB: [birth year]   60 y.o.   MRN: [id number] Visit Date: 08/21/2016  Today's Provider: Lelon Huh, MD   Chief Complaint  Patient presents with  . Nevus   Subjective:    HPI Nevus: Patient presents today for an evaluation of a mole on the left side of her neck. Patient states the mole has been there for several years and has not changed in size or color. Patient states the mole has become more bothersome, because it gets caught on her clothes and jewelry. Patient also reports that she has sun spots on her face that need to be checked out.    Allergies  Allergen Reactions  . Nsaids Other (See Comments)    Because of Crohns  . Sulfa Antibiotics Rash     Current Outpatient Prescriptions:  .  azaTHIOprine (IMURAN) 50 MG tablet, TAKE 3 TABLETS (150 MG TOTAL) BY MOUTH DAILY., Disp: 90 tablet, Rfl: 0 .  azaTHIOprine (IMURAN) 50 MG tablet, TAKE 3 TABLETS (150 MG TOTAL) BY MOUTH DAILY., Disp: 90 tablet, Rfl: 11 .  clonazePAM (KLONOPIN) 1 MG tablet, TAKE 1 TABELT BY MOUTH AT BEDTIME, Disp: 30 tablet, Rfl: 2 .  dicyclomine (BENTYL) 20 MG tablet, Take 1 tablet (20 mg total) by mouth 3 (three) times daily before meals., Disp: 90 tablet, Rfl: 5 .  diphenoxylate-atropine (LOMOTIL) 2.5-0.025 MG tablet, Take 1 tablet by mouth 4 (four) times daily as needed for diarrhea or loose stools., Disp: 120 tablet, Rfl: 5 .  fluconazole (DIFLUCAN) 100 MG tablet, TAKE 1 TABLET (100 MG TOTAL) BY MOUTH DAILY. FOR 2 WEEKS, Disp: , Rfl: 3 .  HYDROcodone-acetaminophen (NORCO) 7.5-325 MG tablet, Take 1 tablet by mouth every 6 (six) hours as needed for moderate pain., Disp: 60 tablet, Rfl: 0 .  lisinopril-hydrochlorothiazide (PRINZIDE,ZESTORETIC) 10-12.5 MG tablet, Take 1 tablet by mouth daily., Disp: 90 tablet, Rfl: 4 .  pantoprazole (PROTONIX) 40 MG tablet, Take 1 tablet (40 mg total) by mouth daily., Disp: 90 tablet, Rfl: 2 .  promethazine (PHENERGAN) 25 MG tablet, Take 1  tablet (25 mg total) by mouth every 6 (six) hours as needed for nausea or vomiting., Disp: 30 tablet, Rfl: 3 .  sertraline (ZOLOFT) 100 MG tablet, Take 1.5 tablets (150 mg total) by mouth daily., Disp: 135 tablet, Rfl: 4  Review of Systems  Constitutional: Negative for appetite change, chills, diaphoresis, fatigue and fever.  Respiratory: Negative for chest tightness and shortness of breath.   Cardiovascular: Negative for chest pain and palpitations.  Gastrointestinal: Negative for abdominal pain, nausea and vomiting.  Skin:       Mole on left side of neck; discoloration of skin on her face  Neurological: Negative for dizziness, weakness, numbness and headaches.    Social History  Substance Use Topics  . Smoking status: Former Smoker    Packs/day: 1.00    Years: 10.00    Types: Cigarettes    Quit date: 11/18/2012  . Smokeless tobacco: Never Used  . Alcohol use 0.0 oz/week     Comment: every once in a while   Objective:   BP 122/80 (BP Location: Right Arm, Patient Position: Sitting, Cuff Size: Large)   Pulse 78   Temp 98.5 F (36.9 C) (Oral)   Resp 16   Wt 191 lb (86.6 kg)   SpO2 97% Comment: room air  BMI 29.91 kg/m   Physical Exam  General appearance: alert, well developed, well nourished, cooperative  and in no distress Head: Normocephalic, without obvious abnormality, atraumatic Respiratory: Respirations even and unlabored, normal respiratory rate  Skin: Well circumscribed 35m round raised brown nevus left side of neck. Several hyperpigmented macular lesion both sides of face with prominent lesion over malar eminence.   Psych: Appropriate mood and affect. Neurologic: Mental status: Alert, oriented to person, place, and time, thought content appropriate.     Assessment & Plan:     1. Need for influenza vaccination  - Flu Vaccine QUAD 36+ mos PF IM (Fluarix & Fluzone Quad PF)  2. Atypical nevus of neck She request dermatology referral for further evaluation.  -  Ambulatory referral to Dermatology       DLelon Huh MD  [facility]

## 2016-08-29 ENCOUNTER — Other Ambulatory Visit: Payer: Self-pay | Admitting: Gastroenterology

## 2016-08-29 DIAGNOSIS — B3781 Candidal esophagitis: Secondary | ICD-10-CM

## 2016-09-02 ENCOUNTER — Other Ambulatory Visit: Payer: Self-pay | Admitting: Family Medicine

## 2016-09-02 MED ORDER — HYDROCODONE-ACETAMINOPHEN 7.5-325 MG PO TABS: 1.0000 | ORAL_TABLET | Freq: Four times a day (QID) | ORAL | 0 refills | Status: DC | PRN

## 2016-09-13 ENCOUNTER — Other Ambulatory Visit: Payer: Self-pay | Admitting: *Deleted

## 2016-09-13 ENCOUNTER — Other Ambulatory Visit: Payer: Self-pay

## 2016-09-16 ENCOUNTER — Other Ambulatory Visit: Payer: Self-pay | Admitting: Family Medicine

## 2016-09-16 MED ORDER — HYDROCODONE-ACETAMINOPHEN 7.5-325 MG PO TABS: 1.0000 | ORAL_TABLET | Freq: Four times a day (QID) | ORAL | 0 refills | Status: DC | PRN

## 2016-09-16 NOTE — Telephone Encounter (Signed)
Please call in clonazepam

## 2016-09-16 NOTE — Telephone Encounter (Signed)
Rx called in to pharmacy. 

## 2016-09-16 NOTE — Telephone Encounter (Signed)
Pt needs refill on Norco 7.5 .  Please call [phone]  Thanks Con Memos

## 2016-09-16 NOTE — Telephone Encounter (Signed)
Rx has already been filled.

## 2016-09-18 ENCOUNTER — Encounter: Payer: Self-pay | Admitting: *Deleted

## 2016-09-26 ENCOUNTER — Ambulatory Visit
Admission: RE | Admit: 2016-09-26 | Discharge: 2016-09-26 | Disposition: A | Discharge status: Home / Self Care | Payer: Medicaid Other | Source: Ambulatory Visit | Attending: Gastroenterology | Admitting: Gastroenterology

## 2016-09-26 ENCOUNTER — Other Ambulatory Visit: Payer: Self-pay | Admitting: *Deleted

## 2016-09-26 ENCOUNTER — Encounter
Admission: RE | Disposition: A | Discharge status: Home / Self Care | Payer: Self-pay | Source: Ambulatory Visit | Attending: Gastroenterology

## 2016-09-26 ENCOUNTER — Ambulatory Visit: Payer: Medicaid Other | Admitting: Anesthesiology

## 2016-09-26 DIAGNOSIS — Z7982 Long term (current) use of aspirin: Secondary | ICD-10-CM | POA: Insufficient documentation

## 2016-09-26 DIAGNOSIS — B3781 Candidal esophagitis: Secondary | ICD-10-CM | POA: Insufficient documentation

## 2016-09-26 DIAGNOSIS — Z87891 Personal history of nicotine dependence: Secondary | ICD-10-CM | POA: Insufficient documentation

## 2016-09-26 DIAGNOSIS — I2510 Atherosclerotic heart disease of native coronary artery without angina pectoris: Secondary | ICD-10-CM | POA: Insufficient documentation

## 2016-09-26 DIAGNOSIS — K222 Esophageal obstruction: Secondary | ICD-10-CM | POA: Diagnosis not present

## 2016-09-26 DIAGNOSIS — K219 Gastro-esophageal reflux disease without esophagitis: Secondary | ICD-10-CM | POA: Insufficient documentation

## 2016-09-26 DIAGNOSIS — Z79899 Other long term (current) drug therapy: Secondary | ICD-10-CM | POA: Diagnosis not present

## 2016-09-26 DIAGNOSIS — Z8042 Family history of malignant neoplasm of prostate: Secondary | ICD-10-CM | POA: Insufficient documentation

## 2016-09-26 DIAGNOSIS — Z8619 Personal history of other infectious and parasitic diseases: Secondary | ICD-10-CM | POA: Insufficient documentation

## 2016-09-26 DIAGNOSIS — Z888 Allergy status to other drugs, medicaments and biological substances status: Secondary | ICD-10-CM | POA: Diagnosis not present

## 2016-09-26 DIAGNOSIS — Z85828 Personal history of other malignant neoplasm of skin: Secondary | ICD-10-CM | POA: Diagnosis not present

## 2016-09-26 DIAGNOSIS — Z825 Family history of asthma and other chronic lower respiratory diseases: Secondary | ICD-10-CM | POA: Insufficient documentation

## 2016-09-26 DIAGNOSIS — M1990 Unspecified osteoarthritis, unspecified site: Secondary | ICD-10-CM | POA: Diagnosis not present

## 2016-09-26 DIAGNOSIS — K5090 Crohn's disease, unspecified, without complications: Secondary | ICD-10-CM | POA: Insufficient documentation

## 2016-09-26 DIAGNOSIS — Z8249 Family history of ischemic heart disease and other diseases of the circulatory system: Secondary | ICD-10-CM | POA: Insufficient documentation

## 2016-09-26 DIAGNOSIS — R1310 Dysphagia, unspecified: Secondary | ICD-10-CM | POA: Diagnosis not present

## 2016-09-26 DIAGNOSIS — D649 Anemia, unspecified: Secondary | ICD-10-CM | POA: Diagnosis not present

## 2016-09-26 DIAGNOSIS — Z882 Allergy status to sulfonamides status: Secondary | ICD-10-CM | POA: Insufficient documentation

## 2016-09-26 DIAGNOSIS — I10 Essential (primary) hypertension: Secondary | ICD-10-CM | POA: Diagnosis not present

## 2016-09-26 DIAGNOSIS — F329 Major depressive disorder, single episode, unspecified: Secondary | ICD-10-CM | POA: Diagnosis not present

## 2016-09-26 HISTORY — PX: ESOPHAGOGASTRODUODENOSCOPY (EGD) WITH PROPOFOL: SHX5813

## 2016-09-26 SURGERY — ESOPHAGOGASTRODUODENOSCOPY (EGD) WITH PROPOFOL
Anesthesia: Monitor Anesthesia Care | Wound class: Clean Contaminated

## 2016-09-26 MED ORDER — PROPOFOL 10 MG/ML IV BOLUS
INTRAVENOUS | Status: DC | PRN
  Administered 2016-09-26 (×2): 50 mg via INTRAVENOUS
  Administered 2016-09-26: 100 mg via INTRAVENOUS

## 2016-09-26 MED ORDER — GLYCOPYRROLATE 0.2 MG/ML IJ SOLN
INTRAMUSCULAR | Status: DC | PRN
  Administered 2016-09-26: 0.2 mg via INTRAVENOUS

## 2016-09-26 MED ORDER — LACTATED RINGERS IV SOLN
INTRAVENOUS | Status: DC
  Administered 2016-09-26: 10:00:00 via INTRAVENOUS

## 2016-09-26 MED ORDER — LIDOCAINE HCL (CARDIAC) 20 MG/ML IV SOLN
INTRAVENOUS | Status: DC | PRN
  Administered 2016-09-26: 50 mg via INTRAVENOUS

## 2016-09-26 SURGICAL SUPPLY — 32 items
BALLN DILATOR 10-12 8 (BALLOONS)
BALLN DILATOR 12-15 8 (BALLOONS) ×2
BALLN DILATOR 15-18 8 (BALLOONS)
BALLN DILATOR CRE 0-12 8 (BALLOONS)
BALLN DILATOR ESOPH 8 10 CRE (MISCELLANEOUS) IMPLANT
BALLOON DILATOR 12-15 8 (BALLOONS) IMPLANT
BALLOON DILATOR 15-18 8 (BALLOONS) IMPLANT
BALLOON DILATOR CRE 0-12 8 (BALLOONS) IMPLANT
BLOCK BITE 60FR ADLT L/F GRN (MISCELLANEOUS) ×2 IMPLANT
CANISTER SUCT 1200ML W/VALVE (MISCELLANEOUS) ×2 IMPLANT
CLIP HMST 235XBRD CATH ROT (MISCELLANEOUS) IMPLANT
CLIP RESOLUTION 360 11X235 (MISCELLANEOUS)
FCP ESCP3.2XJMB 240X2.8X (MISCELLANEOUS)
FORCEPS BIOP RAD 4 LRG CAP 4 (CUTTING FORCEPS) IMPLANT
FORCEPS BIOP RJ4 240 W/NDL (MISCELLANEOUS)
FORCEPS ESCP3.2XJMB 240X2.8X (MISCELLANEOUS) IMPLANT
GOWN CVR UNV OPN BCK APRN NK (MISCELLANEOUS) ×2 IMPLANT
GOWN ISOL THUMB LOOP REG UNIV (MISCELLANEOUS) ×4
INJECTOR VARIJECT VIN23 (MISCELLANEOUS) IMPLANT
KIT DEFENDO VALVE AND CONN (KITS) IMPLANT
KIT ENDO PROCEDURE OLY (KITS) ×2 IMPLANT
MARKER SPOT ENDO TATTOO 5ML (MISCELLANEOUS) IMPLANT
PAD GROUND ADULT SPLIT (MISCELLANEOUS) IMPLANT
RETRIEVER NET PLAT FOOD (MISCELLANEOUS) IMPLANT
SNARE SHORT THROW 13M SML OVAL (MISCELLANEOUS) IMPLANT
SNARE SHORT THROW 30M LRG OVAL (MISCELLANEOUS) IMPLANT
SPOT EX ENDOSCOPIC TATTOO (MISCELLANEOUS)
SYR INFLATION 60ML (SYRINGE) ×1 IMPLANT
TRAP ETRAP POLY (MISCELLANEOUS) IMPLANT
VARIJECT INJECTOR VIN23 (MISCELLANEOUS)
WATER STERILE IRR 250ML POUR (IV SOLUTION) ×2 IMPLANT
WIRE CRE 18-20MM 8CM F G (MISCELLANEOUS) IMPLANT

## 2016-09-26 NOTE — Discharge Instructions (Signed)

## 2016-09-26 NOTE — Transfer of Care (Signed)
Immediate Anesthesia Transfer of Care Note  Patient: Kimberly Hudson  Procedure(s) Performed: Procedure(s): ESOPHAGOGASTRODUODENOSCOPY (EGD) WITH PROPOFOL (N/A)  Patient Location: PACU  Anesthesia Type: MAC  Level of Consciousness: awake, alert  and patient cooperative  Airway and Oxygen Therapy: Patient Spontanous Breathing and Patient connected to supplemental oxygen  Post-op Assessment: Post-op Vital signs reviewed, Patient's Cardiovascular Status Stable, Respiratory Function Stable, Patent Airway and No signs of Nausea or vomiting  Post-op Vital Signs: Reviewed and stable  Complications: No apparent anesthesia complications

## 2016-09-26 NOTE — Anesthesia Procedure Notes (Signed)
Procedure Name: MAC Date/Time: 09/26/2016 10:31 AM Performed by: Janna Arch Pre-anesthesia Checklist: Patient identified, Emergency Drugs available, Suction available and Patient being monitored Patient Re-evaluated:Patient Re-evaluated prior to inductionOxygen Delivery Method: Nasal cannula

## 2016-09-26 NOTE — Anesthesia Postprocedure Evaluation (Signed)
Anesthesia Post Note  Patient: Kimberly Hudson  Procedure(s) Performed: Procedure(s) (LRB): ESOPHAGOGASTRODUODENOSCOPY (EGD) WITH PROPOFOL (N/A)  Patient location during evaluation: PACU Anesthesia Type: MAC Level of consciousness: awake and alert and oriented Pain management: pain level controlled Vital Signs Assessment: post-procedure vital signs reviewed and stable Respiratory status: spontaneous breathing and nonlabored ventilation Cardiovascular status: stable Postop Assessment: no signs of nausea or vomiting and adequate PO intake Anesthetic complications: no    Estill Batten

## 2016-09-26 NOTE — Op Note (Signed)
[facility] Gastroenterology Patient Name: Kimberly Hudson Procedure Date: 09/26/2016 10:27 AM MRN: [id number] Account #: [id number] Date of Birth: January 20, 1956 Admit Type: Outpatient Age: 60 Room: [facility] OR ROOM 01 Gender: Female Note Status: Finalized Procedure:            Upper GI endoscopy Indications:          Dysphagia Providers:            Lucilla Lame MD, MD Referring MD:         Kirstie Peri. Caryn Section, MD (Referring MD) Medicines:            Propofol per Anesthesia Complications:        No immediate complications. Procedure:            Pre-Anesthesia Assessment:                       - Prior to the procedure, a History and Physical was                        performed, and patient medications and allergies were                        reviewed. The patient's tolerance of previous                        anesthesia was also reviewed. The risks and benefits of                        the procedure and the sedation options and risks were                        discussed with the patient. All questions were                        answered, and informed consent was obtained. Prior                        Anticoagulants: The patient has taken no previous                        anticoagulant or antiplatelet agents. ASA Grade                        Assessment: II - A patient with mild systemic disease.                        After reviewing the risks and benefits, the patient was                        deemed in satisfactory condition to undergo the                        procedure.                       After obtaining informed consent, the endoscope was                        passed under direct vision. Throughout the procedure,  the patient's blood pressure, pulse, and oxygen                        saturations were monitored continuously. The Olympus                        GIF H180J colonscope (D#:[id number]) was introduced                        through the  mouth, and advanced to the second part of                        duodenum. The upper GI endoscopy was accomplished                        without difficulty. The patient tolerated the procedure                        well. Findings:      Multiple moderate (circumferential scarring or stenosis; an endoscope       may pass) benign-appearing, intrinsic stenoses were found in the entire       esophagus. The narrowest stenosis measured 1.2 cm (inner diameter) and       they were traversed. A TTS dilator was passed through the scope.       Dilation with a 12-13.5-15 mm balloon dilator was performed to 15 mm.       The dilation site was examined following endoscope reinsertion and       showed moderate improvement in luminal narrowing. Biopsies were taken       with a cold forceps for histology.      The stomach was normal.      The examined duodenum was normal. Impression:           - Benign-appearing esophageal stenoses. Dilated.                        Biopsied.                       - Normal stomach.                       - Normal examined duodenum. Recommendation:       - Discharge patient to home.                       - Resume previous diet.                       - Continue present medications.                       - Await pathology results.                       - Use a proton pump inhibitor PO BID. Procedure Code(s):    --- Professional ---                       [phone], Esophagogastroduodenoscopy, flexible, transoral;                        with transendoscopic balloon dilation of esophagus                        (  less than 30 mm diameter)                       43239, Esophagogastroduodenoscopy, flexible, transoral;                        with biopsy, single or multiple Diagnosis Code(s):    --- Professional ---                       R13.10, Dysphagia, unspecified                       K22.2, Esophageal obstruction CPT copyright 2016 American Medical Association. All rights  reserved. The codes documented in this report are preliminary and upon coder review may  be revised to meet current compliance requirements. Lucilla Lame MD, MD 09/26/2016 10:43:30 AM This report has been signed electronically. Number of Addenda: 0 Note Initiated On: 09/26/2016 10:27 AM Total Procedure Duration: 0 hours 6 minutes 14 seconds       [facility]

## 2016-09-26 NOTE — Anesthesia Preprocedure Evaluation (Signed)
Anesthesia Evaluation  Patient identified by MRN, date of birth, ID band Patient awake    Reviewed: Allergy & Precautions, NPO status , Patient's Chart, lab work & pertinent test results  History of Anesthesia Complications (+) history of anesthetic complications  Airway Mallampati: I  TM Distance: >3 FB Neck ROM: Full    Dental no notable dental hx.    Pulmonary former smoker,    Pulmonary exam normal        Cardiovascular hypertension, Pt. on medications + CAD  Normal cardiovascular exam     Neuro/Psych PSYCHIATRIC DISORDERS Depression    GI/Hepatic Neg liver ROS, GERD  Medicated and Controlled,  Endo/Other  negative endocrine ROS  Renal/GU negative Renal ROS     Musculoskeletal  (+) Arthritis , Osteoarthritis,    Abdominal   Peds  Hematology  (+) anemia ,   Anesthesia Other Findings   Reproductive/Obstetrics                             Anesthesia Physical Anesthesia Plan  ASA: II  Anesthesia Plan: MAC   Post-op Pain Management:    Induction: Intravenous  Airway Management Planned:   Additional Equipment:   Intra-op Plan:   Post-operative Plan:   Informed Consent: I have reviewed the patients History and Physical, chart, labs and discussed the procedure including the risks, benefits and alternatives for the proposed anesthesia with the patient or authorized representative who has indicated his/her understanding and acceptance.     Plan Discussed with: CRNA  Anesthesia Plan Comments:         Anesthesia Quick Evaluation

## 2016-09-26 NOTE — H&P (Signed)
Lucilla Lame, MD [facility] [address], [place], [place] [address] Phone: [phone] Fax : [phone]  Primary Care Physician:  Lelon Huh, MD Primary Gastroenterologist:  Dr. Allen Norris  Pre-Procedure History & Physical: HPI:  Kimberly Hudson is a 60 y.o. female is here for an endoscopy.   Past Medical History:  Diagnosis Date  . Anemia    past hx  . Arthritis    osteo - shoulders, hips, knees  . Complication of anesthesia    propofol causes headaches  . Coronary artery disease   . Crohn's disease ([place])   . GERD (gastroesophageal reflux disease)   . History of chicken pox   . History of measles   . History of mumps   . Hypertension   . 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 Wears dentures    full upper    Past Surgical History:  Procedure Laterality Date  . COLONOSCOPY  02/2013   No polyps were found  . ESOPHAGOGASTRODUODENOSCOPY (EGD) WITH PROPOFOL N/A 06/28/2015   Procedure: ESOPHAGOGASTRODUODENOSCOPY (EGD) WITH PROPOFOL;  Surgeon: Lucilla Lame, MD;  Location: [place];  Service: Endoscopy;  Laterality: N/A;  with dialation  . ESOPHAGOGASTRODUODENOSCOPY (EGD) WITH PROPOFOL N/A 07/26/2015   Procedure: ESOPHAGOGASTRODUODENOSCOPY (EGD) WITH PROPOFOL, with dialation;  Surgeon: Lucilla Lame, MD;  Location: [place];  Service: Endoscopy;  Laterality: N/A;  LEAVE PT LAST  . ESOPHAGOGASTRODUODENOSCOPY (EGD) WITH PROPOFOL N/A 08/05/2016   Procedure: ESOPHAGOGASTRODUODENOSCOPY (EGD) WITH PROPOFOL;  Surgeon: Lucilla Lame, MD;  Location: [place];  Service: Endoscopy;  Laterality: N/A;  . Excision BCC  07/28/2013   right nares, Georgiann Cocker  . TUBAL LIGATION  1986  . UPPER GI ENDOSCOPY  02/2014    Prior to Admission medications   Medication Sig Start Date End Date Taking? Authorizing Provider  azaTHIOprine (IMURAN) 50 MG tablet TAKE 3 TABLETS (150 MG TOTAL) BY MOUTH DAILY. 08/16/15  Yes Tawnya Pujol Allen Norris, MD  azaTHIOprine (IMURAN)  50 MG tablet TAKE 3 TABLETS (150 MG TOTAL) BY MOUTH DAILY. 08/13/16  Yes Lucilla Lame, MD  clonazePAM (KLONOPIN) 1 MG tablet TAKE 1 TABLET BY MOUTH AT BEDTIME 09/16/16  Yes Birdie Sons, MD  dicyclomine (BENTYL) 20 MG tablet Take 1 tablet (20 mg total) by mouth 3 (three) times daily before meals. 01/09/16  Yes Lucilla Lame, MD  diphenoxylate-atropine (LOMOTIL) 2.5-0.025 MG tablet Take 1 tablet by mouth 4 (four) times daily as needed for diarrhea or loose stools. 01/09/16  Yes Lucilla Lame, MD  HYDROcodone-acetaminophen (NORCO) 7.5-325 MG tablet Take 1 tablet by mouth every 6 (six) hours as needed for moderate pain. 09/16/16  Yes Birdie Sons, MD  lisinopril-hydrochlorothiazide (PRINZIDE,ZESTORETIC) 10-12.5 MG tablet Take 1 tablet by mouth daily. 07/25/16  Yes Birdie Sons, MD  pantoprazole (PROTONIX) 40 MG tablet Take 1 tablet (40 mg total) by mouth daily. 01/15/16  Yes Birdie Sons, MD  promethazine (PHENERGAN) 25 MG tablet Take 1 tablet (25 mg total) by mouth every 6 (six) hours as needed for nausea or vomiting. 06/04/16  Yes Lucilla Lame, MD  sertraline (ZOLOFT) 100 MG tablet Take 1.5 tablets (150 mg total) by mouth daily. 04/19/16  Yes Birdie Sons, MD    Allergies as of 09/13/2016 - Review Complete 08/21/2016  Allergen Reaction Noted  . Nsaids Other (See Comments) 06/19/2015  . Sulfa antibiotics Rash 04/27/2015    Family History  Problem Relation Age of Onset  . COPD Mother   . Heart disease Mother   .  COPD Father   . Emphysema Father   . Prostate cancer Father   . COPD Sister   . Heart disease Sister     Social History   Social History  . Marital status: Widowed    Spouse name: N/A  . Number of children: N/A  . Years of education: N/A   Occupational History  . Cashier    Social History Main Topics  . Smoking status: Former Smoker    Packs/day: 1.00    Years: 10.00    Types: Cigarettes    Quit date: 11/18/2012  . Smokeless tobacco: Never Used  . Alcohol use 0.0  oz/week     Comment: every once in a while  . Drug use: No  . Sexual activity: Not Currently   Other Topics Concern  . Not on file   Social History Narrative  . No narrative on file    Review of Systems: See HPI, otherwise negative ROS  Physical Exam: BP 128/69   Pulse 74   Temp 97.6 F (36.4 C)   Resp 16   Ht 5' 7"  (1.702 m)   Wt 189 lb (85.7 kg)   SpO2 96%   BMI 29.60 kg/m  General:   Alert,  pleasant and cooperative in NAD Head:  Normocephalic and atraumatic. Neck:  Supple; no masses or thyromegaly. Lungs:  Clear throughout to auscultation.    Heart:  Regular rate and rhythm. Abdomen:  Soft, nontender and nondistended. Normal bowel sounds, without guarding, and without rebound.   Neurologic:  Alert and  oriented x4;  grossly normal neurologically.  Impression/Plan: Kimberly Hudson is here for an endoscopy to be performed for dysphagia  Risks, benefits, limitations, and alternatives regarding  endoscopy have been reviewed with the patient.  Questions have been answered.  All parties agreeable.   Lucilla Lame, MD  09/26/2016, 10:00 AM

## 2016-09-27 ENCOUNTER — Encounter: Payer: Self-pay | Admitting: Gastroenterology

## 2016-09-27 ENCOUNTER — Other Ambulatory Visit: Payer: Self-pay

## 2016-09-27 ENCOUNTER — Other Ambulatory Visit: Payer: Self-pay | Admitting: *Deleted

## 2016-09-27 MED ORDER — HYDROCODONE-ACETAMINOPHEN 7.5-325 MG PO TABS: 1.0000 | ORAL_TABLET | Freq: Four times a day (QID) | ORAL | 0 refills | Status: DC | PRN

## 2016-09-27 MED ORDER — PANTOPRAZOLE SODIUM 40 MG PO TBEC: 40.0000 mg | DELAYED_RELEASE_TABLET | Freq: Two times a day (BID) | ORAL | 6 refills | Status: DC

## 2016-09-27 MED ORDER — PANTOPRAZOLE SODIUM 40 MG PO TBEC: 40.0000 mg | DELAYED_RELEASE_TABLET | Freq: Every day | ORAL | 6 refills | Status: DC

## 2016-09-27 NOTE — Telephone Encounter (Signed)
Patient called and states that Dr Allen Norris was suppose to write new instructions for her Protonix. She has not received that RX yet. Her pharmacy [facility] on [address]. Please advise . Thank you

## 2016-09-27 NOTE — Telephone Encounter (Signed)
Pt advised a new rx for protonix was sent to her [facility].

## 2016-09-28 ENCOUNTER — Ambulatory Visit (INDEPENDENT_AMBULATORY_CARE_PROVIDER_SITE_OTHER): Payer: Medicaid Other | Admitting: Family Medicine

## 2016-09-28 ENCOUNTER — Encounter: Payer: Self-pay | Admitting: Family Medicine

## 2016-09-28 VITALS — BP 120/70 | HR 78 | Temp 99.1°F | Resp 16 | Wt 189.0 lb

## 2016-09-28 DIAGNOSIS — B370 Candidal stomatitis: Secondary | ICD-10-CM

## 2016-09-28 DIAGNOSIS — K50819 Crohn's disease of both small and large intestine with unspecified complications: Secondary | ICD-10-CM

## 2016-09-28 MED ORDER — FLUCONAZOLE 150 MG PO TABS: 150.0000 mg | ORAL_TABLET | Freq: Once | ORAL | 0 refills | Status: AC

## 2016-09-28 MED ORDER — NYSTATIN 100000 UNIT/ML MT SUSP: 5.0000 mL | Freq: Four times a day (QID) | OROMUCOSAL | 0 refills | Status: DC

## 2016-09-28 NOTE — Progress Notes (Signed)
Patient: Kimberly Hudson Female    DOB: 1956-10-11   60 y.o.   MRN: [id number] Visit Date: 09/28/2016  Today's Provider: Wilhemena Durie, MD   Chief Complaint  Patient presents with  . Mouth Lesions    possible thrush.    Subjective:    HPI Patient c/o possible recurrent thrush. She has a long history of recurrent thrush. She has had Crohn's disease for over 30 years and had a endoscopy with dilatation 2 days ago. She has had some mild soreness from this but otherwise feels well. Certainly no fevers or dysphagia.    Allergies  Allergen Reactions  . Nsaids Other (See Comments)    Because of Crohns  . Sulfa Antibiotics Rash     Current Outpatient Prescriptions:  .  azaTHIOprine (IMURAN) 50 MG tablet, TAKE 3 TABLETS (150 MG TOTAL) BY MOUTH DAILY., Disp: 90 tablet, Rfl: 11 .  clonazePAM (KLONOPIN) 1 MG tablet, TAKE 1 TABLET BY MOUTH AT BEDTIME, Disp: 30 tablet, Rfl: 5 .  dicyclomine (BENTYL) 20 MG tablet, Take 1 tablet (20 mg total) by mouth 3 (three) times daily before meals., Disp: 90 tablet, Rfl: 5 .  diphenoxylate-atropine (LOMOTIL) 2.5-0.025 MG tablet, Take 1 tablet by mouth 4 (four) times daily as needed for diarrhea or loose stools., Disp: 120 tablet, Rfl: 5 .  HYDROcodone-acetaminophen (NORCO) 7.5-325 MG tablet, Take 1 tablet by mouth every 6 (six) hours as needed for moderate pain., Disp: 60 tablet, Rfl: 0 .  lisinopril-hydrochlorothiazide (PRINZIDE,ZESTORETIC) 10-12.5 MG tablet, Take 1 tablet by mouth daily., Disp: 90 tablet, Rfl: 4 .  pantoprazole (PROTONIX) 40 MG tablet, Take 1 tablet (40 mg total) by mouth 2 (two) times daily., Disp: 60 tablet, Rfl: 6 .  promethazine (PHENERGAN) 25 MG tablet, Take 1 tablet (25 mg total) by mouth every 6 (six) hours as needed for nausea or vomiting., Disp: 30 tablet, Rfl: 3 .  sertraline (ZOLOFT) 100 MG tablet, Take 1.5 tablets (150 mg total) by mouth daily., Disp: 135 tablet, Rfl: 4  Review of Systems  Constitutional:  Negative.   HENT: Positive for mouth sores.        Her dysphagia is improving with recent endoscopy and dilatation  Eyes: Negative.   Respiratory: Negative.   Cardiovascular: Negative.   Endocrine: Negative.   Allergic/Immunologic: Negative.   Neurological: Negative.   Psychiatric/Behavioral: Negative.     Social History  Substance Use Topics  . Smoking status: Former Smoker    Packs/day: 1.00    Years: 10.00    Types: Cigarettes    Quit date: 11/18/2012  . Smokeless tobacco: Never Used  . Alcohol use 0.0 oz/week     Comment: every once in a while   Objective:   BP 120/70 (BP Location: Left Arm, Patient Position: Sitting, Cuff Size: Large)   Pulse 78   Temp 99.1 F (37.3 C) (Oral)   Resp 16   Wt 189 lb (85.7 kg)   SpO2 98%   BMI 29.60 kg/m   Physical Exam  Constitutional: She appears well-developed and well-nourished.  HENT:  Head: Normocephalic and atraumatic.  Right Ear: External ear normal.  Left Ear: External ear normal.  Nose: Nose normal.  Mouth/Throat: Oropharyngeal exudate present.  She has classic appearing thrush of the tongue and posterior pharynx  Eyes: Conjunctivae are normal. No scleral icterus.  Neck: No thyromegaly present.  Very mild submandibular adenopathy bilaterally laterally, nontender  Cardiovascular: Normal rate and regular rhythm.   Pulmonary/Chest: Effort  normal and breath sounds normal.  Lymphadenopathy:    She has cervical adenopathy.        Assessment & Plan:     Oral thrush After discussion with patient will try nystatin first, if this does not work she is given a prescription for Diflucan for 3 days. I understand that it gets worse during this time that she will go immediately to the Diflucan to stop the nystatin. Crohn's disease with recent esophageal dilatation/EGD She has follow-up scheduled with [facility] for this.     I have done the exam and reviewed the chart and it is accurate to the best of my knowledge. Miguel Aschoff  M.D. Mills, MD  [facility]

## 2016-10-03 ENCOUNTER — Encounter: Payer: Self-pay | Admitting: Gastroenterology

## 2016-10-08 ENCOUNTER — Other Ambulatory Visit: Payer: Self-pay | Admitting: *Deleted

## 2016-10-09 NOTE — Telephone Encounter (Signed)
Please advise 

## 2016-10-09 NOTE — Telephone Encounter (Signed)
Pt called to see if the Rx for HYDROcodone-acetaminophen (NORCO) 7.5-325 MG tablet was ready. Pt stated that she will run out over the weekend and she wasn't sure what are hours would be for the holiday. I advised pt that we would be open a half a day Friday 10/11/16. Please advise. Thanks TNP

## 2016-10-11 MED ORDER — HYDROCODONE-ACETAMINOPHEN 7.5-325 MG PO TABS: 1.0000 | ORAL_TABLET | Freq: Four times a day (QID) | ORAL | 0 refills | Status: DC | PRN

## 2016-10-16 ENCOUNTER — Ambulatory Visit (INDEPENDENT_AMBULATORY_CARE_PROVIDER_SITE_OTHER): Payer: Medicaid Other | Admitting: Family Medicine

## 2016-10-16 ENCOUNTER — Encounter: Payer: Self-pay | Admitting: Family Medicine

## 2016-10-16 VITALS — BP 140/80 | HR 69 | Temp 98.7°F | Resp 16 | Wt 192.0 lb

## 2016-10-16 DIAGNOSIS — I10 Essential (primary) hypertension: Secondary | ICD-10-CM

## 2016-10-16 DIAGNOSIS — D649 Anemia, unspecified: Secondary | ICD-10-CM | POA: Diagnosis not present

## 2016-10-16 MED ORDER — LISINOPRIL-HYDROCHLOROTHIAZIDE 10-12.5 MG PO TABS: 1.0000 | ORAL_TABLET | Freq: Every day | ORAL | 4 refills | Status: DC

## 2016-10-16 NOTE — Patient Instructions (Signed)
Check your blood pressure in the morning before eating. If it is consistently above 140 then let me know and double dose of lisinopril-hctz to two tablets daily

## 2016-10-16 NOTE — Progress Notes (Signed)
Patient: Kimberly Hudson Female    DOB: 1956-11-10   60 y.o.   MRN: [id number] Visit Date: 10/16/2016  Today's Provider: Lelon Huh, MD   Chief Complaint  Patient presents with  . Hypertension   Subjective:    HPI  Hypertension, follow-up:  BP Readings from Last 3 Encounters:  09/28/16 120/70  09/26/16 128/69  08/21/16 122/80    She was last seen for hypertension 5 months ago.  BP at that visit was 112/70. Management since that visit includes no changes. She reports good compliance with treatment. She is not having side effects.  She is exercising. She is not adherent to low salt diet.   Outside blood pressures being checked. Patient was seen by her Dentist yesterday and was told that her blood pressure was elevate at 174/98. Patient thinks she may have forgotten to take her medication before going to the dentist. She is experiencing none.  Patient denies chest pain, chest pressure/discomfort, claudication, dyspnea, exertional chest pressure/discomfort, fatigue, irregular heart beat, lower extremity edema, near-syncope, orthopnea, palpitations, paroxysmal nocturnal dyspnea, syncope and tachypnea.   Cardiovascular risk factors include hypertension.  Use of agents associated with hypertension: none.     Weight trend: fluctuating a bit Wt Readings from Last 3 Encounters:  09/28/16 189 lb (85.7 kg)  09/26/16 189 lb (85.7 kg)  08/21/16 191 lb (86.6 kg)    Current diet: well balanced  She also states she has had more pain in her legs and back this week since putting up Christmas decorations this weekend.  ------------------------------------------------------------------------  Follow up anemia Lab Results  Component Value Date   WBC 3.4 05/09/2016   HGB 10.6 (A) 05/31/2014   HCT 25.2 (L) 05/09/2016   MCV 96 05/09/2016   PLT 307 05/09/2016   Lab Results  Component Value Date   FERRITIN 15 05/09/2016   Since labs were checked in June she has started  taking one OTC iron sulfate tablet every day and tolerating fairly well. She has not had any blood work done since then.      Allergies  Allergen Reactions  . Nsaids Other (See Comments)    Because of Crohns  . Sulfa Antibiotics Rash     Current Outpatient Prescriptions:  .  azaTHIOprine (IMURAN) 50 MG tablet, TAKE 3 TABLETS (150 MG TOTAL) BY MOUTH DAILY., Disp: 90 tablet, Rfl: 11 .  clonazePAM (KLONOPIN) 1 MG tablet, TAKE 1 TABLET BY MOUTH AT BEDTIME, Disp: 30 tablet, Rfl: 5 .  dicyclomine (BENTYL) 20 MG tablet, Take 1 tablet (20 mg total) by mouth 3 (three) times daily before meals., Disp: 90 tablet, Rfl: 5 .  diphenoxylate-atropine (LOMOTIL) 2.5-0.025 MG tablet, Take 1 tablet by mouth 4 (four) times daily as needed for diarrhea or loose stools., Disp: 120 tablet, Rfl: 5 .  HYDROcodone-acetaminophen (NORCO) 7.5-325 MG tablet, Take 1 tablet by mouth every 6 (six) hours as needed for moderate pain., Disp: 60 tablet, Rfl: 0 .  lisinopril-hydrochlorothiazide (PRINZIDE,ZESTORETIC) 10-12.5 MG tablet, Take 1 tablet by mouth daily., Disp: 90 tablet, Rfl: 4 .  pantoprazole (PROTONIX) 40 MG tablet, Take 1 tablet (40 mg total) by mouth 2 (two) times daily., Disp: 60 tablet, Rfl: 6 .  promethazine (PHENERGAN) 25 MG tablet, Take 1 tablet (25 mg total) by mouth every 6 (six) hours as needed for nausea or vomiting., Disp: 30 tablet, Rfl: 3 .  sertraline (ZOLOFT) 100 MG tablet, Take 1.5 tablets (150 mg total) by mouth daily., Disp: 135 tablet,  Rfl: 4  Review of Systems  Constitutional: Negative for appetite change, chills, fatigue and fever.  Respiratory: Negative for chest tightness and shortness of breath.   Cardiovascular: Negative for chest pain and palpitations.  Gastrointestinal: Negative for abdominal pain, nausea and vomiting.  Musculoskeletal: Positive for arthralgias (due to arthritis in her knees).  Neurological: Negative for dizziness and weakness.    Social History  Substance Use  Topics  . Smoking status: Former Smoker    Packs/day: 1.00    Years: 10.00    Types: Cigarettes    Quit date: 11/18/2012  . Smokeless tobacco: Never Used  . Alcohol use 0.0 oz/week     Comment: every once in a while   Objective:   BP 140/80 (BP Location: Right Arm, Patient Position: Sitting, Cuff Size: Normal)   Pulse 69   Temp 98.7 F (37.1 C) (Oral)   Resp 16   Wt 192 lb (87.1 kg)   SpO2 96% Comment: room air  BMI 30.07 kg/m   Physical Exam  General appearance: alert, well developed, well nourished, cooperative and in no distress Head: Normocephalic, without obvious abnormality, atraumatic Respiratory: Respirations even and unlabored, normal respiratory rate Extremities: No gross deformities Skin: Skin color, texture, turgor normal. No rashes seen  Psych: Appropriate mood and affect. Neurologic: Mental status: Alert, oriented to person, place, and time, thought content appropriate.     Assessment & Plan:     1. Essential hypertension BP better today.   Patient Instructions  Check your blood pressure in the morning before eating. If it is consistently above 140 then let me know and double dose of lisinopril-hctz to two tablets daily    2. Anemia, unspecified type Multifactorial. Likely chronic anemia due to Imuran and exacerbated by iron deficiency. Is tolerating one iron tablet a day. Check labs today.   - CBC - Ferritin  The entirety of the information documented in the History of Present Illness, Review of Systems and Physical Exam were personally obtained by me. Portions of this information were initially documented by Meyer Cory, CMA and reviewed by me for thoroughness and accuracy.        Lelon Huh, MD  [facility]

## 2016-10-17 ENCOUNTER — Other Ambulatory Visit: Payer: Self-pay

## 2016-10-17 ENCOUNTER — Telehealth: Payer: Self-pay

## 2016-10-17 LAB — CBC
HEMATOCRIT: 28.8 % — AB (ref 34.0–46.6)
Hemoglobin: 9.5 g/dL — ABNORMAL LOW (ref 11.1–15.9)
MCH: 31.6 pg (ref 26.6–33.0)
MCHC: 33 g/dL (ref 31.5–35.7)
MCV: 96 fL (ref 79–97)
Platelets: 293 10*3/uL (ref 150–379)
RBC: 3.01 x10E6/uL — ABNORMAL LOW (ref 3.77–5.28)
RDW: 16.2 % — AB (ref 12.3–15.4)
WBC: 4.3 10*3/uL (ref 3.4–10.8)

## 2016-10-17 LAB — FERRITIN: Ferritin: 10 ng/mL — ABNORMAL LOW (ref 15–150)

## 2016-10-17 MED ORDER — FLUCONAZOLE 100 MG PO TABS: 100.0000 mg | ORAL_TABLET | Freq: Every day | ORAL | 0 refills | Status: DC

## 2016-10-17 NOTE — Telephone Encounter (Signed)
[id number] 

## 2016-10-17 NOTE — Telephone Encounter (Signed)
Patient was advised. KW

## 2016-10-17 NOTE — Telephone Encounter (Signed)
-----   Message from Birdie Sons, MD sent at 10/17/2016  7:52 AM EST ----- Still anemia, but hemoglobin has improved significantly from 8.1 to 9.5. Iron levels (ferritin) levels are still pretty low. Need to continue taking at least one iron pill a day. Check cbc Q6 months.

## 2016-10-23 ENCOUNTER — Other Ambulatory Visit: Payer: Self-pay | Admitting: *Deleted

## 2016-10-24 ENCOUNTER — Telehealth: Payer: Self-pay | Admitting: Family Medicine

## 2016-10-24 MED ORDER — HYDROCODONE-ACETAMINOPHEN 7.5-325 MG PO TABS: 1.0000 | ORAL_TABLET | Freq: Four times a day (QID) | ORAL | 0 refills | Status: DC | PRN

## 2016-10-24 NOTE — Telephone Encounter (Signed)
error 

## 2016-11-04 ENCOUNTER — Other Ambulatory Visit: Payer: Self-pay

## 2016-11-04 NOTE — Telephone Encounter (Signed)
Patient sent an email requesting refills. Pharmacy listed is correct. Thanks!

## 2016-11-06 MED ORDER — HYDROCODONE-ACETAMINOPHEN 7.5-325 MG PO TABS: 1.0000 | ORAL_TABLET | Freq: Four times a day (QID) | ORAL | 0 refills | Status: DC | PRN

## 2016-11-20 ENCOUNTER — Other Ambulatory Visit: Payer: Self-pay | Admitting: *Deleted

## 2016-11-20 MED ORDER — SERTRALINE HCL 100 MG PO TABS: 150.0000 mg | ORAL_TABLET | Freq: Every day | ORAL | 4 refills | Status: DC

## 2016-11-20 MED ORDER — HYDROCODONE-ACETAMINOPHEN 7.5-325 MG PO TABS: 1.0000 | ORAL_TABLET | Freq: Four times a day (QID) | ORAL | 0 refills | Status: DC | PRN

## 2016-11-20 NOTE — Telephone Encounter (Signed)
[facility].  Con Memos

## 2016-11-20 NOTE — Telephone Encounter (Signed)
Called pt to find out which pharmacy she wants rx for sertraline to go to.

## 2016-12-02 ENCOUNTER — Other Ambulatory Visit: Payer: Self-pay | Admitting: *Deleted

## 2016-12-03 ENCOUNTER — Telehealth: Payer: Self-pay | Admitting: Family Medicine

## 2016-12-03 MED ORDER — HYDROCODONE-ACETAMINOPHEN 7.5-325 MG PO TABS: 1.0000 | ORAL_TABLET | Freq: Four times a day (QID) | ORAL | 0 refills | Status: DC | PRN

## 2016-12-03 NOTE — Telephone Encounter (Signed)
error 

## 2016-12-17 ENCOUNTER — Other Ambulatory Visit: Payer: Self-pay | Admitting: *Deleted

## 2016-12-17 MED ORDER — HYDROCODONE-ACETAMINOPHEN 7.5-325 MG PO TABS: 1.0000 | ORAL_TABLET | Freq: Four times a day (QID) | ORAL | 0 refills | Status: DC | PRN

## 2016-12-31 ENCOUNTER — Other Ambulatory Visit: Payer: Self-pay | Admitting: Family Medicine

## 2016-12-31 MED ORDER — HYDROCODONE-ACETAMINOPHEN 7.5-325 MG PO TABS: 1.0000 | ORAL_TABLET | Freq: Four times a day (QID) | ORAL | 0 refills | Status: DC | PRN

## 2016-12-31 NOTE — Telephone Encounter (Signed)
Pt needs refill on her  HYDROcodone-acetaminophen (NORCO) 7.5-325 MG tablet  Thanks teri

## 2017-01-13 ENCOUNTER — Other Ambulatory Visit: Payer: Self-pay

## 2017-01-13 NOTE — Telephone Encounter (Signed)
Patient sent email requesting refills. Thanks!

## 2017-01-14 MED ORDER — HYDROCODONE-ACETAMINOPHEN 7.5-325 MG PO TABS: 1.0000 | ORAL_TABLET | Freq: Four times a day (QID) | ORAL | 0 refills | Status: DC | PRN

## 2017-01-22 ENCOUNTER — Ambulatory Visit (INDEPENDENT_AMBULATORY_CARE_PROVIDER_SITE_OTHER): Payer: Medicaid Other | Admitting: Family Medicine

## 2017-01-22 ENCOUNTER — Encounter: Payer: Self-pay | Admitting: Family Medicine

## 2017-01-22 DIAGNOSIS — S39012A Strain of muscle, fascia and tendon of lower back, initial encounter: Secondary | ICD-10-CM | POA: Diagnosis not present

## 2017-01-22 DIAGNOSIS — M545 Low back pain, unspecified: Secondary | ICD-10-CM | POA: Insufficient documentation

## 2017-01-22 MED ORDER — CYCLOBENZAPRINE HCL 5 MG PO TABS: 5.0000 mg | ORAL_TABLET | Freq: Three times a day (TID) | ORAL | 1 refills | Status: DC | PRN

## 2017-01-22 NOTE — Patient Instructions (Signed)
   Call back for referral to orthopedist if knees not improving in the next 1-2 weeks   Call for prescription for prednisone if back not improving in the next 1-2 weeks.

## 2017-01-22 NOTE — Progress Notes (Signed)
Patient: Kimberly Hudson Female    DOB: 1956-07-14   61 y.o.   MRN: [id number] Visit Date: 01/22/2017  Today's Provider: Lelon Huh, MD   Chief Complaint  Patient presents with  . Back Pain    x 2-3 weeks  . Shoulder Pain    x several months   Subjective:    Back Pain  This is a new problem. Episode onset: 2-3 weeks ago. The problem occurs constantly. The problem is unchanged. The pain is present in the lumbar spine. The quality of the pain is described as aching. Radiates to: both legs. The symptoms are aggravated by bending. Associated symptoms include leg pain, numbness (in right hand) and tingling (in right hand). Pertinent negatives include no abdominal pain, bladder incontinence, bowel incontinence, chest pain, dysuria, fever, headaches, pelvic pain or weakness. She has tried heat for the symptoms. The treatment provided mild relief.  Shoulder Pain   The pain is present in the right shoulder and left shoulder. This is a recurrent problem. Episode onset: several months ago. There has been no history of extremity trauma. The problem occurs intermittently. The problem has been gradually worsening. The quality of the pain is described as sharp and aching. Associated symptoms include joint swelling (in knees), numbness (in right hand), stiffness and tingling (in right hand). Pertinent negatives include no fever, itching or limited range of motion. She has tried heat for the symptoms. The treatment provided moderate relief.       Allergies  Allergen Reactions  . Nsaids Other (See Comments)    Because of Crohns  . Sulfa Antibiotics Rash     Current Outpatient Prescriptions:  .  azaTHIOprine (IMURAN) 50 MG tablet, TAKE 3 TABLETS (150 MG TOTAL) BY MOUTH DAILY., Disp: 90 tablet, Rfl: 11 .  clonazePAM (KLONOPIN) 1 MG tablet, TAKE 1 TABLET BY MOUTH AT BEDTIME, Disp: 30 tablet, Rfl: 5 .  dicyclomine (BENTYL) 20 MG tablet, Take 1 tablet (20 mg total) by mouth 3 (three) times daily  before meals., Disp: 90 tablet, Rfl: 5 .  diphenoxylate-atropine (LOMOTIL) 2.5-0.025 MG tablet, Take 1 tablet by mouth 4 (four) times daily as needed for diarrhea or loose stools., Disp: 120 tablet, Rfl: 5 .  fluconazole (DIFLUCAN) 100 MG tablet, Take 1 tablet (100 mg total) by mouth daily., Disp: 14 tablet, Rfl: 0 .  HYDROcodone-acetaminophen (NORCO) 7.5-325 MG tablet, Take 1 tablet by mouth every 6 (six) hours as needed for moderate pain., Disp: 60 tablet, Rfl: 0 .  lisinopril-hydrochlorothiazide (PRINZIDE,ZESTORETIC) 10-12.5 MG tablet, Take 1 tablet by mouth daily., Disp: 90 tablet, Rfl: 4 .  pantoprazole (PROTONIX) 40 MG tablet, Take 1 tablet (40 mg total) by mouth 2 (two) times daily., Disp: 60 tablet, Rfl: 6 .  promethazine (PHENERGAN) 25 MG tablet, Take 1 tablet (25 mg total) by mouth every 6 (six) hours as needed for nausea or vomiting., Disp: 30 tablet, Rfl: 3 .  sertraline (ZOLOFT) 100 MG tablet, Take 1.5 tablets (150 mg total) by mouth daily., Disp: 135 tablet, Rfl: 4  Review of Systems  Constitutional: Negative for appetite change, chills, fatigue and fever.  Respiratory: Negative for chest tightness and shortness of breath.   Cardiovascular: Negative for chest pain and palpitations.  Gastrointestinal: Negative for abdominal pain, bowel incontinence, nausea and vomiting.  Genitourinary: Negative for bladder incontinence, dysuria and pelvic pain.  Musculoskeletal: Positive for arthralgias (both shoulders), back pain and stiffness.  Skin: Negative for itching.  Neurological: Positive for tingling (in  right hand) and numbness (in right hand). Negative for dizziness, weakness and headaches.    Social History  Substance Use Topics  . Smoking status: Former Smoker    Packs/day: 1.00    Years: 10.00    Types: Cigarettes    Quit date: 11/18/2012  . Smokeless tobacco: Never Used  . Alcohol use 0.0 oz/week     Comment: every once in a while   Objective:   BP 110/70 (BP Location: Left  Arm, Patient Position: Sitting, Cuff Size: Normal)   Pulse 76   Temp 98.4 F (36.9 C) (Oral)   Resp 16   Wt 194 lb (88 kg)   SpO2 98% Comment: room air  BMI 30.38 kg/m     Physical Exam   General Appearance:    Alert, cooperative, no distress  Eyes:    PERRL, conjunctiva/corneas clear, EOM's intact       Lungs:     Clear to auscultation bilaterally, respirations unlabored  Heart:    Regular rate and rhythm  Neurologic:   Awake, alert, oriented x 3. No apparent focal neurological           defect.   MS:   Tender lumbar spine and paralumbar muscles . FROM of shoulders.        Assessment & Plan:     1. Strain of lumbar region, initial encounter  - cyclobenzaprine (FLEXERIL) 5 MG tablet; Take 1 tablet (5 mg total) by mouth 3 (three) times daily as needed for muscle spasms.  Dispense: 30 tablet; Refill: 1  2. Acute midline low back pain without sciatica  Patient Instructions   Call back for referral to orthopedist if knees not improving in the next 1-2 weeks   Call for prescription for prednisone if back not improving in the next 1-2 weeks.         Lelon Huh, MD  [facility]

## 2017-01-28 ENCOUNTER — Other Ambulatory Visit: Payer: Self-pay | Admitting: *Deleted

## 2017-01-28 ENCOUNTER — Other Ambulatory Visit: Payer: Self-pay | Admitting: Gastroenterology

## 2017-01-28 DIAGNOSIS — K50919 Crohn's disease, unspecified, with unspecified complications: Secondary | ICD-10-CM

## 2017-01-28 MED ORDER — HYDROCODONE-ACETAMINOPHEN 7.5-325 MG PO TABS: 1.0000 | ORAL_TABLET | Freq: Four times a day (QID) | ORAL | 0 refills | Status: DC | PRN

## 2017-02-04 ENCOUNTER — Other Ambulatory Visit: Payer: Self-pay | Admitting: Gastroenterology

## 2017-02-04 DIAGNOSIS — K50919 Crohn's disease, unspecified, with unspecified complications: Secondary | ICD-10-CM

## 2017-02-11 ENCOUNTER — Other Ambulatory Visit: Payer: Self-pay | Admitting: *Deleted

## 2017-02-11 DIAGNOSIS — S39012A Strain of muscle, fascia and tendon of lower back, initial encounter: Secondary | ICD-10-CM

## 2017-02-11 MED ORDER — CYCLOBENZAPRINE HCL 5 MG PO TABS: 5.0000 mg | ORAL_TABLET | Freq: Three times a day (TID) | ORAL | 1 refills | Status: DC | PRN

## 2017-02-11 MED ORDER — HYDROCODONE-ACETAMINOPHEN 7.5-325 MG PO TABS: 1.0000 | ORAL_TABLET | Freq: Four times a day (QID) | ORAL | 0 refills | Status: DC | PRN

## 2017-02-12 ENCOUNTER — Telehealth: Payer: Self-pay | Admitting: Gastroenterology

## 2017-02-12 NOTE — Telephone Encounter (Signed)
Pt currently taking Imuran for her Crohn's disease. Can this cause recurrent thrush? If so, she is requesting diflucan to treat. Please advise.

## 2017-02-12 NOTE — Telephone Encounter (Signed)
Patient called and has thrush again and needs a prescription called into [facility]

## 2017-02-13 ENCOUNTER — Other Ambulatory Visit: Payer: Self-pay

## 2017-02-13 MED ORDER — FLUCONAZOLE 100 MG PO TABS
100.0000 mg | ORAL_TABLET | Freq: Every day | ORAL | 0 refills | Status: DC
Start: 2017-02-13 — End: 2017-03-21

## 2017-02-13 NOTE — Telephone Encounter (Signed)
Yes and she can be started on diflucan 122m po qd for 2 weeks.

## 2017-02-13 NOTE — Telephone Encounter (Signed)
Rx sent to pt's pharmacy

## 2017-02-24 ENCOUNTER — Other Ambulatory Visit: Payer: Self-pay | Admitting: *Deleted

## 2017-02-25 MED ORDER — HYDROCODONE-ACETAMINOPHEN 7.5-325 MG PO TABS: 1.0000 | ORAL_TABLET | Freq: Four times a day (QID) | ORAL | 0 refills | Status: DC | PRN

## 2017-03-03 ENCOUNTER — Other Ambulatory Visit: Payer: Self-pay | Admitting: Family Medicine

## 2017-03-03 DIAGNOSIS — S39012A Strain of muscle, fascia and tendon of lower back, initial encounter: Secondary | ICD-10-CM

## 2017-03-04 NOTE — Telephone Encounter (Signed)
Please call in clonazepam

## 2017-03-10 ENCOUNTER — Other Ambulatory Visit: Payer: Self-pay | Admitting: *Deleted

## 2017-03-11 MED ORDER — HYDROCODONE-ACETAMINOPHEN 7.5-325 MG PO TABS: 1.0000 | ORAL_TABLET | Freq: Four times a day (QID) | ORAL | 0 refills | Status: DC | PRN

## 2017-03-21 ENCOUNTER — Other Ambulatory Visit: Payer: Self-pay | Admitting: Family Medicine

## 2017-03-21 MED ORDER — FLUCONAZOLE 100 MG PO TABS: 100.0000 mg | ORAL_TABLET | Freq: Every day | ORAL | 0 refills | Status: DC

## 2017-03-21 NOTE — Telephone Encounter (Signed)
Please review. Patient states she is having this issue again and this is recurrent for her. States she has Crohn's -aa

## 2017-03-21 NOTE — Telephone Encounter (Signed)
Patient wanted nystatin for oral thrush it is not in her med list so I did not pull it down, I was not sure which one-aa

## 2017-03-21 NOTE — Telephone Encounter (Signed)
Pt needs refill on her yeast medications.    fluconazole (DIFLUCAN) 100 MG tablet   02/13/17 -- Lucilla Lame, MD    Take 1 tablet (100 mg total) by mouth daily   And nystatin for oral thrush  [facility]  Call back [phone]  Thanks Con Memos

## 2017-03-25 ENCOUNTER — Telehealth: Payer: Self-pay | Admitting: Family Medicine

## 2017-03-25 ENCOUNTER — Encounter (INDEPENDENT_AMBULATORY_CARE_PROVIDER_SITE_OTHER): Payer: Self-pay

## 2017-03-25 MED ORDER — HYDROCODONE-ACETAMINOPHEN 7.5-325 MG PO TABS: 1.0000 | ORAL_TABLET | Freq: Four times a day (QID) | ORAL | 0 refills | Status: DC | PRN

## 2017-03-25 NOTE — Telephone Encounter (Signed)
Pt contacted office for refill request on the following medications: HYDROcodone-acetaminophen (NORCO) 7.5-325 MG tablet.  CB#[phone]/MW

## 2017-03-25 NOTE — Telephone Encounter (Signed)
Medication was just last filled on 03/11/17, patient states that she will run out of medication today. When I asked patient states that she is taking medication every 6hs 4x a day. KW

## 2017-03-31 ENCOUNTER — Other Ambulatory Visit: Payer: Self-pay | Admitting: Family Medicine

## 2017-03-31 DIAGNOSIS — S39012A Strain of muscle, fascia and tendon of lower back, initial encounter: Secondary | ICD-10-CM

## 2017-04-03 ENCOUNTER — Other Ambulatory Visit: Payer: Self-pay | Admitting: Family Medicine

## 2017-04-03 DIAGNOSIS — M25569 Pain in unspecified knee: Secondary | ICD-10-CM

## 2017-04-06 ENCOUNTER — Other Ambulatory Visit: Payer: Self-pay | Admitting: Family Medicine

## 2017-04-07 MED ORDER — HYDROCODONE-ACETAMINOPHEN 7.5-325 MG PO TABS
1.0000 | ORAL_TABLET | Freq: Four times a day (QID) | ORAL | 0 refills | Status: DC | PRN
Start: 2017-04-07 — End: 2017-04-20

## 2017-04-07 NOTE — Telephone Encounter (Signed)
Pt advised rx is ready for pick up, and she agrees to referral. Renaldo Fiddler, CMA

## 2017-04-07 NOTE — Telephone Encounter (Signed)
Please advise patient that prescription for hydrocodone has been printed, but she should see orthopedist for knee pain since she is having to take pain medication so frequently and still having pain. Can send referral to sarah.

## 2017-04-07 NOTE — Telephone Encounter (Signed)
Patient was notified.

## 2017-04-20 ENCOUNTER — Other Ambulatory Visit: Payer: Self-pay | Admitting: Family Medicine

## 2017-04-21 MED ORDER — HYDROCODONE-ACETAMINOPHEN 7.5-325 MG PO TABS: 1.0000 | ORAL_TABLET | Freq: Four times a day (QID) | ORAL | 0 refills | Status: DC | PRN

## 2017-05-04 ENCOUNTER — Other Ambulatory Visit: Payer: Self-pay | Admitting: Family Medicine

## 2017-05-05 MED ORDER — HYDROCODONE-ACETAMINOPHEN 7.5-325 MG PO TABS: 1.0000 | ORAL_TABLET | Freq: Four times a day (QID) | ORAL | 0 refills | Status: DC | PRN

## 2017-05-08 ENCOUNTER — Other Ambulatory Visit: Payer: Self-pay

## 2017-05-08 ENCOUNTER — Ambulatory Visit: Payer: Disability Insurance | Admitting: Gastroenterology

## 2017-05-08 ENCOUNTER — Encounter: Payer: Self-pay | Admitting: Gastroenterology

## 2017-05-08 ENCOUNTER — Other Ambulatory Visit
Admission: RE | Admit: 2017-05-08 | Discharge: 2017-05-08 | Disposition: A | Discharge status: Home / Self Care | Payer: BLUE CROSS/BLUE SHIELD | Source: Ambulatory Visit | Attending: Gastroenterology | Admitting: Gastroenterology

## 2017-05-08 ENCOUNTER — Ambulatory Visit (INDEPENDENT_AMBULATORY_CARE_PROVIDER_SITE_OTHER): Payer: BLUE CROSS/BLUE SHIELD | Admitting: Gastroenterology

## 2017-05-08 VITALS — BP 121/57 | HR 72 | Temp 98.9°F | Ht 67.0 in | Wt 190.0 lb

## 2017-05-08 DIAGNOSIS — R1310 Dysphagia, unspecified: Secondary | ICD-10-CM | POA: Diagnosis not present

## 2017-05-08 DIAGNOSIS — K50919 Crohn's disease, unspecified, with unspecified complications: Secondary | ICD-10-CM

## 2017-05-08 DIAGNOSIS — M2240 Chondromalacia patellae, unspecified knee: Secondary | ICD-10-CM | POA: Insufficient documentation

## 2017-05-08 DIAGNOSIS — R1319 Other dysphagia: Secondary | ICD-10-CM

## 2017-05-08 LAB — CBC WITH DIFFERENTIAL/PLATELET
BASOS PCT: 1 %
Basophils Absolute: 0 10*3/uL (ref 0–0.1)
EOS ABS: 0.1 10*3/uL (ref 0–0.7)
EOS PCT: 3 %
HCT: 29 % — ABNORMAL LOW (ref 35.0–47.0)
Hemoglobin: 9.7 g/dL — ABNORMAL LOW (ref 12.0–16.0)
Lymphocytes Relative: 13 %
Lymphs Abs: 0.5 10*3/uL — ABNORMAL LOW (ref 1.0–3.6)
MCH: 33.4 pg (ref 26.0–34.0)
MCHC: 33.4 g/dL (ref 32.0–36.0)
MCV: 100 fL (ref 80.0–100.0)
MONO ABS: 0.2 10*3/uL (ref 0.2–0.9)
Monocytes Relative: 7 %
Neutro Abs: 2.8 10*3/uL (ref 1.4–6.5)
Neutrophils Relative %: 76 %
PLATELETS: 342 10*3/uL (ref 150–440)
RBC: 2.9 MIL/uL — ABNORMAL LOW (ref 3.80–5.20)
RDW: 20.1 % — AB (ref 11.5–14.5)
WBC: 3.7 10*3/uL (ref 3.6–11.0)

## 2017-05-08 LAB — HEPATIC FUNCTION PANEL
ALT: 9 U/L — AB (ref 14–54)
AST: 21 U/L (ref 15–41)
Albumin: 3.3 g/dL — ABNORMAL LOW (ref 3.5–5.0)
Alkaline Phosphatase: 82 U/L (ref 38–126)
BILIRUBIN TOTAL: 0.6 mg/dL (ref 0.3–1.2)
Total Protein: 7.1 g/dL (ref 6.5–8.1)

## 2017-05-08 NOTE — Progress Notes (Signed)
Primary Care Physician: Birdie Sons, MD  Primary Gastroenterologist:  Dr. Lucilla Lame  Chief Complaint  Patient presents with  . Diarrhea  . Abdominal Cramping    HPI: Kimberly Hudson is a 61 y.o. female here with diarrhea and dysphagia. This patient has seen me in the past for dysphagia and has had multiple attempts at dilation.  The patient has had multiple strictures throughout the esophagus treated with dilation.  The patient also has a history of Crohn's disease and recurrent candidal esophageal infections due to her immunosuppression medication.  The patient denies any rectal bleeding the present time but states she is having more diarrhea and thinks it may be due to stress that she is been under.  Current Outpatient Prescriptions  Medication Sig Dispense Refill  . azaTHIOprine (IMURAN) 50 MG tablet azathioprine 50 mg tablet  TAKE 3 TABLETS (150 MG TOTAL) BY MOUTH DAILY.    . clonazePAM (KLONOPIN) 1 MG tablet TAKE 1 TABLET BY MOUTH AT BEDTIME 30 tablet 5  . cyclobenzaprine (FLEXERIL) 5 MG tablet TAKE 1 TABLET (5 MG TOTAL) BY MOUTH 3 (THREE) TIMES DAILY AS NEEDED FOR MUSCLE SPASMS. 30 tablet 5  . dicyclomine (BENTYL) 20 MG tablet Take 1 tablet (20 mg total) by mouth 3 (three) times daily before meals. 90 tablet 5  . fluconazole (DIFLUCAN) 100 MG tablet Take 1 tablet (100 mg total) by mouth daily. 14 tablet 0  . lisinopril-hydrochlorothiazide (PRINZIDE,ZESTORETIC) 10-12.5 MG tablet Take 1 tablet by mouth daily. 90 tablet 4  . pantoprazole (PROTONIX) 40 MG tablet Take 1 tablet (40 mg total) by mouth 2 (two) times daily. 60 tablet 6  . promethazine (PHENERGAN) 25 MG tablet promethazine 25 mg tablet  TAKE 1 TABLET (25 MG TOTAL) BY MOUTH EVERY 6 (SIX) HOURS AS NEEDED FOR NAUSEA OR VOMITING.    . sertraline (ZOLOFT) 100 MG tablet Take 1.5 tablets (150 mg total) by mouth daily. 135 tablet 4  . diphenoxylate-atropine (LOMOTIL) 2.5-0.025 MG tablet Take 1 tablet by mouth 4 (four) times  daily as needed for diarrhea or loose stools. (Patient not taking: Reported on 05/08/2017) 120 tablet 5  . HYDROcodone-acetaminophen (NORCO) 7.5-325 MG tablet Take 1 tablet by mouth every 6 (six) hours as needed for moderate pain. (Patient not taking: Reported on 05/08/2017) 60 tablet 0  . pravastatin (PRAVACHOL) 10 MG tablet Take by mouth.     No current facility-administered medications for this visit.     Allergies as of 05/08/2017 - Review Complete 05/08/2017  Allergen Reaction Noted  . Nsaids Other (See Comments) 04/21/2014  . Sulfa antibiotics Rash 04/21/2014    ROS:  General: Negative for anorexia, weight loss, fever, chills, fatigue, weakness. ENT: Negative for hoarseness, difficulty swallowing , nasal congestion. CV: Negative for chest pain, angina, palpitations, dyspnea on exertion, peripheral edema.  Respiratory: Negative for dyspnea at rest, dyspnea on exertion, cough, sputum, wheezing.  GI: See history of present illness. GU:  Negative for dysuria, hematuria, urinary incontinence, urinary frequency, nocturnal urination.  Endo: Negative for unusual weight change.    Physical Examination:   BP (!) 121/57   Pulse 72   Temp 98.9 F (37.2 C) (Oral)   Ht 5' 7"  (1.702 m)   Wt 190 lb (86.2 kg)   BMI 29.76 kg/m   General: Well-nourished, well-developed in no acute distress.  Eyes: No icterus. Conjunctivae pink. Mouth: Oropharyngeal mucosa moist and pink , no lesions erythema or exudate. Lungs: Clear to auscultation bilaterally. Non-labored. Heart: Regular rate  and rhythm, no murmurs rubs or gallops.  Abdomen: Bowel sounds are normal, nontender, nondistended, no hepatosplenomegaly or masses, no abdominal bruits or hernia , no rebound or guarding.   Extremities: No lower extremity edema. No clubbing or deformities. Neuro: Alert and oriented x 3.  Grossly intact. Skin: Warm and dry, no jaundice.   Psych: Alert and cooperative, normal mood and affect.  Labs:    Imaging  Studies: No results found.  Assessment and Plan:   Kimberly Hudson is a 61 y.o. y/o female with a history of Crohn's disease who is now having diarrhea and abdominal cramps with dysphagia.  The patient will have her blood centile for CRP.  The patient will also be set up for repeat EGD with dilation and injection of steroids in the esophagus to try to decrease the recurrence of her esophageal strictures.  The patient has also been told that due to her Crohn's disease she will follow up with Dr. Marius Ditch in the future when she joins our practice.    Lucilla Lame, MD. [name]   Note: This dictation was prepared with Dragon dictation along with smaller phrase technology. Any transcriptional errors that result from this process are unintentional.

## 2017-05-09 ENCOUNTER — Telehealth: Payer: Self-pay

## 2017-05-09 LAB — C-REACTIVE PROTEIN: CRP: <0.8 mg/dL (ref <1.0)

## 2017-05-09 NOTE — Telephone Encounter (Signed)
-----   Message from Lucilla Lame, MD sent at 05/09/2017 12:38 PM EDT ----- The patient know that her CRP was negative and her other blood tests only showed her chronic anemia.

## 2017-05-09 NOTE — Telephone Encounter (Signed)
Left vm letting pt know results.

## 2017-05-18 ENCOUNTER — Other Ambulatory Visit: Payer: Self-pay | Admitting: Gastroenterology

## 2017-05-18 ENCOUNTER — Other Ambulatory Visit: Payer: Self-pay | Admitting: Family Medicine

## 2017-05-19 MED ORDER — HYDROCODONE-ACETAMINOPHEN 7.5-325 MG PO TABS: 1.0000 | ORAL_TABLET | Freq: Four times a day (QID) | ORAL | 0 refills | Status: DC | PRN

## 2017-05-28 NOTE — Discharge Instructions (Signed)
General Anesthesia, Adult, Care After °These instructions provide you with information about caring for yourself after your procedure. Your health care provider may also give you more specific instructions. Your treatment has been planned according to current medical practices, but problems sometimes occur. Call your health care provider if you have any problems or questions after your procedure. °What can I expect after the procedure? °After the procedure, it is common to have: °· Vomiting. °· A sore throat. °· Mental slowness. ° °It is common to feel: °· Nauseous. °· Cold or shivery. °· Sleepy. °· Tired. °· Sore or achy, even in parts of your body where you did not have surgery. ° °Follow these instructions at home: °For at least 24 hours after the procedure: °· Do not: °? Participate in activities where you could fall or become injured. °? Drive. °? Use heavy machinery. °? Drink alcohol. °? Take sleeping pills or medicines that cause drowsiness. °? Make important decisions or sign legal documents. °? Take care of children on your own. °· Rest. °Eating and drinking °· If you vomit, drink water, juice, or soup when you can drink without vomiting. °· Drink enough fluid to keep your urine clear or pale yellow. °· Make sure you have little or no nausea before eating solid foods. °· Follow the diet recommended by your health care provider. °General instructions °· Have a responsible adult stay with you until you are awake and alert. °· Return to your normal activities as told by your health care provider. Ask your health care provider what activities are safe for you. °· Take over-the-counter and prescription medicines only as told by your health care provider. °· If you smoke, do not smoke without supervision. °· Keep all follow-up visits as told by your health care provider. This is important. °Contact a health care provider if: °· You continue to have nausea or vomiting at home, and medicines are not helpful. °· You  cannot drink fluids or start eating again. °· You cannot urinate after 8-12 hours. °· You develop a skin rash. °· You have fever. °· You have increasing redness at the site of your procedure. °Get help right away if: °· You have difficulty breathing. °· You have chest pain. °· You have unexpected bleeding. °· You feel that you are having a life-threatening or urgent problem. °This information is not intended to replace advice given to you by your health care provider. Make sure you discuss any questions you have with your health care provider. °Document Released: 02/10/2001 Document Revised: 04/08/2016 Document Reviewed: 10/19/2015 °Elsevier Interactive Patient Education © 2018 [employer] ° °

## 2017-05-29 ENCOUNTER — Ambulatory Visit: Payer: BLUE CROSS/BLUE SHIELD | Admitting: Anesthesiology

## 2017-05-29 ENCOUNTER — Encounter
Admission: RE | Disposition: A | Discharge status: Home / Self Care | Payer: Self-pay | Source: Ambulatory Visit | Attending: Gastroenterology

## 2017-05-29 ENCOUNTER — Ambulatory Visit
Admission: RE | Admit: 2017-05-29 | Discharge: 2017-05-29 | Disposition: A | Discharge status: Home / Self Care | Payer: BLUE CROSS/BLUE SHIELD | Source: Ambulatory Visit | Attending: Gastroenterology | Admitting: Gastroenterology

## 2017-05-29 DIAGNOSIS — K219 Gastro-esophageal reflux disease without esophagitis: Secondary | ICD-10-CM | POA: Insufficient documentation

## 2017-05-29 DIAGNOSIS — I10 Essential (primary) hypertension: Secondary | ICD-10-CM | POA: Insufficient documentation

## 2017-05-29 DIAGNOSIS — R1310 Dysphagia, unspecified: Secondary | ICD-10-CM | POA: Diagnosis not present

## 2017-05-29 DIAGNOSIS — Z87891 Personal history of nicotine dependence: Secondary | ICD-10-CM | POA: Diagnosis not present

## 2017-05-29 DIAGNOSIS — D649 Anemia, unspecified: Secondary | ICD-10-CM | POA: Diagnosis not present

## 2017-05-29 DIAGNOSIS — M1990 Unspecified osteoarthritis, unspecified site: Secondary | ICD-10-CM | POA: Diagnosis not present

## 2017-05-29 DIAGNOSIS — K5090 Crohn's disease, unspecified, without complications: Secondary | ICD-10-CM | POA: Insufficient documentation

## 2017-05-29 DIAGNOSIS — K222 Esophageal obstruction: Secondary | ICD-10-CM | POA: Diagnosis not present

## 2017-05-29 DIAGNOSIS — I2510 Atherosclerotic heart disease of native coronary artery without angina pectoris: Secondary | ICD-10-CM | POA: Diagnosis not present

## 2017-05-29 DIAGNOSIS — Z79899 Other long term (current) drug therapy: Secondary | ICD-10-CM | POA: Diagnosis not present

## 2017-05-29 HISTORY — PX: ESOPHAGEAL DILATION: SHX303

## 2017-05-29 HISTORY — PX: ESOPHAGOGASTRODUODENOSCOPY (EGD) WITH PROPOFOL: SHX5813

## 2017-05-29 SURGERY — ESOPHAGOGASTRODUODENOSCOPY (EGD) WITH PROPOFOL
Anesthesia: General | Wound class: Clean Contaminated

## 2017-05-29 MED ORDER — PROPOFOL 10 MG/ML IV BOLUS
INTRAVENOUS | Status: DC | PRN
  Administered 2017-05-29 (×4): 50 mg via INTRAVENOUS

## 2017-05-29 MED ORDER — TRIAMCINOLONE ACETONIDE 40 MG/ML IJ SUSP
INTRAMUSCULAR | Status: DC | PRN
  Administered 2017-05-29: 40 mg

## 2017-05-29 MED ORDER — GLYCOPYRROLATE 0.2 MG/ML IJ SOLN
INTRAMUSCULAR | Status: DC | PRN
  Administered 2017-05-29: 0.2 mg via INTRAVENOUS

## 2017-05-29 MED ORDER — LIDOCAINE HCL (CARDIAC) 20 MG/ML IV SOLN
INTRAVENOUS | Status: DC | PRN
  Administered 2017-05-29: 50 mg via INTRAVENOUS

## 2017-05-29 MED ORDER — LACTATED RINGERS IV SOLN
INTRAVENOUS | Status: DC | PRN
  Administered 2017-05-29: 10:00:00 via INTRAVENOUS

## 2017-05-29 MED ORDER — STERILE WATER FOR IRRIGATION IR SOLN
Status: DC | PRN
  Administered 2017-05-29: 12:00:00

## 2017-05-29 SURGICAL SUPPLY — 32 items
BALLN DILATOR 10-12 8 (BALLOONS)
BALLN DILATOR 12-15 8 (BALLOONS) ×3
BALLN DILATOR 15-18 8 (BALLOONS)
BALLN DILATOR CRE 0-12 8 (BALLOONS)
BALLN DILATOR ESOPH 8 10 CRE (MISCELLANEOUS) IMPLANT
BALLOON DILATOR 12-15 8 (BALLOONS) IMPLANT
BALLOON DILATOR 15-18 8 (BALLOONS) IMPLANT
BALLOON DILATOR CRE 0-12 8 (BALLOONS) IMPLANT
BLOCK BITE 60FR ADLT L/F GRN (MISCELLANEOUS) ×3 IMPLANT
CANISTER SUCT 1200ML W/VALVE (MISCELLANEOUS) ×3 IMPLANT
CLIP HMST 235XBRD CATH ROT (MISCELLANEOUS) IMPLANT
CLIP RESOLUTION 360 11X235 (MISCELLANEOUS)
FCP ESCP3.2XJMB 240X2.8X (MISCELLANEOUS)
FORCEPS BIOP RAD 4 LRG CAP 4 (CUTTING FORCEPS) IMPLANT
FORCEPS BIOP RJ4 240 W/NDL (MISCELLANEOUS)
FORCEPS ESCP3.2XJMB 240X2.8X (MISCELLANEOUS) IMPLANT
GOWN CVR UNV OPN BCK APRN NK (MISCELLANEOUS) ×4 IMPLANT
GOWN ISOL THUMB LOOP REG UNIV (MISCELLANEOUS) ×6
INJECTOR VARIJECT VIN23 (MISCELLANEOUS) IMPLANT
KIT DEFENDO VALVE AND CONN (KITS) IMPLANT
KIT ENDO PROCEDURE OLY (KITS) ×3 IMPLANT
MARKER SPOT ENDO TATTOO 5ML (MISCELLANEOUS) IMPLANT
PAD GROUND ADULT SPLIT (MISCELLANEOUS) IMPLANT
RETRIEVER NET PLAT FOOD (MISCELLANEOUS) IMPLANT
SNARE SHORT THROW 13M SML OVAL (MISCELLANEOUS) IMPLANT
SNARE SHORT THROW 30M LRG OVAL (MISCELLANEOUS) IMPLANT
SPOT EX ENDOSCOPIC TATTOO (MISCELLANEOUS)
SYR INFLATION 60ML (SYRINGE) ×1 IMPLANT
TRAP ETRAP POLY (MISCELLANEOUS) IMPLANT
VARIJECT INJECTOR VIN23 (MISCELLANEOUS) ×3
WATER STERILE IRR 250ML POUR (IV SOLUTION) ×3 IMPLANT
WIRE CRE 18-20MM 8CM F G (MISCELLANEOUS) IMPLANT

## 2017-05-29 NOTE — Anesthesia Preprocedure Evaluation (Signed)
Anesthesia Evaluation  Patient identified by MRN, date of birth, ID band Patient awake    Reviewed: Allergy & Precautions, H&P , NPO status , Patient's Chart, lab work & pertinent test results  Airway Mallampati: II  TM Distance: >3 FB Neck ROM: full    Dental  (+) Edentulous Lower, Upper Dentures   Pulmonary former smoker,    Pulmonary exam normal breath sounds clear to auscultation       Cardiovascular hypertension, + CAD  Normal cardiovascular exam Rhythm:regular Rate:Normal     Neuro/Psych    GI/Hepatic GERD  ,  Endo/Other    Renal/GU      Musculoskeletal   Abdominal   Peds  Hematology   Anesthesia Other Findings   Reproductive/Obstetrics                             Anesthesia Physical Anesthesia Plan  ASA: II  Anesthesia Plan: General   Post-op Pain Management:    Induction:   PONV Risk Score and Plan: 3 and Propofol  Airway Management Planned:   Additional Equipment:   Intra-op Plan:   Post-operative Plan:   Informed Consent: I have reviewed the patients History and Physical, chart, labs and discussed the procedure including the risks, benefits and alternatives for the proposed anesthesia with the patient or authorized representative who has indicated his/her understanding and acceptance.     Plan Discussed with: CRNA  Anesthesia Plan Comments:         Anesthesia Quick Evaluation

## 2017-05-29 NOTE — Anesthesia Procedure Notes (Signed)
Performed by: Londell Moh Pre-anesthesia Checklist: Patient identified, Emergency Drugs available, Suction available, Timeout performed and Patient being monitored Patient Re-evaluated:Patient Re-evaluated prior to induction Oxygen Delivery Method: Nasal cannula Placement Confirmation: positive ETCO2

## 2017-05-29 NOTE — Anesthesia Postprocedure Evaluation (Signed)
Anesthesia Post Note  Patient: Kimberly Hudson  Procedure(s) Performed: Procedure(s) (LRB): ESOPHAGOGASTRODUODENOSCOPY (EGD) WITH PROPOFOL - Injection of steroid in esophagus (N/A) ESOPHAGEAL DILATION  Patient location during evaluation: PACU Anesthesia Type: General Level of consciousness: awake and alert and oriented Pain management: satisfactory to patient Vital Signs Assessment: post-procedure vital signs reviewed and stable Respiratory status: spontaneous breathing, nonlabored ventilation and respiratory function stable Cardiovascular status: blood pressure returned to baseline and stable Postop Assessment: Adequate PO intake and No signs of nausea or vomiting Anesthetic complications: no    Raliegh Ip

## 2017-05-29 NOTE — Transfer of Care (Signed)
Immediate Anesthesia Transfer of Care Note  Patient: Kimberly Hudson  Procedure(s) Performed: Procedure(s): ESOPHAGOGASTRODUODENOSCOPY (EGD) WITH PROPOFOL - Injection of steroid in esophagus (N/A) ESOPHAGEAL DILATION  Patient Location: PACU  Anesthesia Type: General  Level of Consciousness: awake, alert  and patient cooperative  Airway and Oxygen Therapy: Patient Spontanous Breathing and Patient connected to supplemental oxygen  Post-op Assessment: Post-op Vital signs reviewed, Patient's Cardiovascular Status Stable, Respiratory Function Stable, Patent Airway and No signs of Nausea or vomiting  Post-op Vital Signs: Reviewed and stable  Complications: No apparent anesthesia complications

## 2017-05-29 NOTE — Op Note (Signed)
[facility] Gastroenterology Patient Name: Kimberly Hudson Procedure Date: 05/29/2017 11:41 AM MRN: [id number] Account #: [id number] Date of Birth: 07-Feb-1956 Admit Type: Outpatient Age: 61 Room: [facility] OR ROOM 01 Gender: Female Note Status: Finalized Procedure:            Upper GI endoscopy Indications:          Dysphagia Providers:            Lucilla Lame MD, MD Referring MD:         Kirstie Peri. Caryn Section, MD (Referring MD) Medicines:            Propofol per Anesthesia Complications:        No immediate complications. Procedure:            Pre-Anesthesia Assessment:                       - Prior to the procedure, a History and Physical was                        performed, and patient medications and allergies were                        reviewed. The patient's tolerance of previous                        anesthesia was also reviewed. The risks and benefits of                        the procedure and the sedation options and risks were                        discussed with the patient. All questions were                        answered, and informed consent was obtained. Prior                        Anticoagulants: The patient has taken no previous                        anticoagulant or antiplatelet agents. ASA Grade                        Assessment: II - A patient with mild systemic disease.                        After reviewing the risks and benefits, the patient was                        deemed in satisfactory condition to undergo the                        procedure.                       After obtaining informed consent, the endoscope was                        passed under direct vision. Throughout the procedure,  the patient's blood pressure, pulse, and oxygen                        saturations were monitored continuously. The Olympus                        GIF H180J Endoscope (K#:[id number]) was introduced through                        the  mouth, and advanced to the second part of duodenum.                        The upper GI endoscopy was accomplished without                        difficulty. The patient tolerated the procedure well. Findings:      Many moderate benign-appearing, intrinsic stenoses were found in the       entire esophagus. And they were traversed. A TTS dilator was passed       through the scope. Dilation with a 12-13.5-15 mm balloon dilator was       performed to 13.5 mm. Area was successfully injected with 4 mL Kenolog       40 for decrease fibrosis.      The stomach was normal.      The examined duodenum was normal. Impression:           - Benign-appearing esophageal stenoses. Dilated.                        Injected.                       - Normal stomach.                       - Normal examined duodenum.                       - No specimens collected. Recommendation:       - Discharge patient to home.                       - Resume previous diet.                       - Continue present medications.                       - Repeat upper endoscopy PRN for retreatment. Procedure Code(s):    --- Professional ---                       [phone], Esophagogastroduodenoscopy, flexible, transoral;                        with transendoscopic balloon dilation of esophagus                        (less than 30 mm diameter) Diagnosis Code(s):    --- Professional ---                       R13.10, Dysphagia, unspecified  K22.2, Esophageal obstruction CPT copyright 2016 American Medical Association. All rights reserved. The codes documented in this report are preliminary and upon coder review may  be revised to meet current compliance requirements. Lucilla Lame MD, MD 05/29/2017 12:12:07 PM This report has been signed electronically. Number of Addenda: 0 Note Initiated On: 05/29/2017 11:41 AM      [facility]

## 2017-05-29 NOTE — H&P (Signed)
Lucilla Lame, MD [address] [place], [place] [address] Phone:[phone] Fax : [phone]  Primary Care Physician:  Birdie Sons, MD Primary Gastroenterologist:  Dr. Allen Norris  Pre-Procedure History & Physical: HPI:  Kimberly Hudson is a 61 y.o. female is here for an endoscopy.   Past Medical History:  Diagnosis Date  . Anemia    past hx  . Arthritis    osteo - shoulders, hips, knees  . Complication of anesthesia    propofol causes headaches  . Coronary artery disease   . Crohn's disease ([place])   . GERD (gastroesophageal reflux disease)   . History of chicken pox   . History of measles   . History of mumps   . Hypertension   . 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 Wears dentures    full upper    Past Surgical History:  Procedure Laterality Date  . COLONOSCOPY  02/2013   No polyps were found  . ESOPHAGOGASTRODUODENOSCOPY (EGD) WITH PROPOFOL N/A 06/28/2015   Procedure: ESOPHAGOGASTRODUODENOSCOPY (EGD) WITH PROPOFOL;  Surgeon: Lucilla Lame, MD;  Location: [place];  Service: Endoscopy;  Laterality: N/A;  with dialation  . ESOPHAGOGASTRODUODENOSCOPY (EGD) WITH PROPOFOL N/A 07/26/2015   Procedure: ESOPHAGOGASTRODUODENOSCOPY (EGD) WITH PROPOFOL, with dialation;  Surgeon: Lucilla Lame, MD;  Location: [place];  Service: Endoscopy;  Laterality: N/A;  LEAVE PT LAST  . ESOPHAGOGASTRODUODENOSCOPY (EGD) WITH PROPOFOL N/A 08/05/2016   Procedure: ESOPHAGOGASTRODUODENOSCOPY (EGD) WITH PROPOFOL;  Surgeon: Lucilla Lame, MD;  Location: [place];  Service: Endoscopy;  Laterality: N/A;  . ESOPHAGOGASTRODUODENOSCOPY (EGD) WITH PROPOFOL N/A 09/26/2016   Procedure: ESOPHAGOGASTRODUODENOSCOPY (EGD) WITH PROPOFOL;  Surgeon: Lucilla Lame, MD;  Location: [place];  Service: Endoscopy;  Laterality: N/A;  . Excision BCC  07/28/2013   right nares, Georgiann Cocker  . TUBAL LIGATION  1986  . UPPER GI ENDOSCOPY  02/2014    Prior  to Admission medications   Medication Sig Start Date End Date Taking? Authorizing Provider  azaTHIOprine (IMURAN) 50 MG tablet azathioprine 50 mg tablet  TAKE 3 TABLETS (150 MG TOTAL) BY MOUTH DAILY.   Yes [provider]  clonazePAM (KLONOPIN) 1 MG tablet TAKE 1 TABLET BY MOUTH AT BEDTIME 03/04/17  Yes Birdie Sons, MD  cyclobenzaprine (FLEXERIL) 5 MG tablet TAKE 1 TABLET (5 MG TOTAL) BY MOUTH 3 (THREE) TIMES DAILY AS NEEDED FOR MUSCLE SPASMS. 03/31/17  Yes Birdie Sons, MD  fluconazole (DIFLUCAN) 100 MG tablet Take 1 tablet (100 mg total) by mouth daily. 03/21/17  Yes Birdie Sons, MD  HYDROcodone-acetaminophen (NORCO) 7.5-325 MG tablet Take 1 tablet by mouth every 6 (six) hours as needed for moderate pain. 05/19/17  Yes Birdie Sons, MD  lisinopril-hydrochlorothiazide (PRINZIDE,ZESTORETIC) 10-12.5 MG tablet Take 1 tablet by mouth daily. 10/16/16  Yes Birdie Sons, MD  pantoprazole (PROTONIX) 40 MG tablet TAKE 1 TABLET (40 MG TOTAL) BY MOUTH 2 (TWO) TIMES DAILY. 05/19/17  Yes Lucilla Lame, MD  sertraline (ZOLOFT) 100 MG tablet Take 1.5 tablets (150 mg total) by mouth daily. 11/20/16  Yes Birdie Sons, MD  dicyclomine (BENTYL) 20 MG tablet Take 1 tablet (20 mg total) by mouth 3 (three) times daily before meals. 01/09/16   Lucilla Lame, MD  diphenoxylate-atropine (LOMOTIL) 2.5-0.025 MG tablet Take 1 tablet by mouth 4 (four) times daily as needed for diarrhea or loose stools. Patient not taking: Reported on 05/08/2017 01/09/16   Lucilla Lame, MD  pravastatin (PRAVACHOL) 10 MG tablet  Take by mouth.    [provider]  promethazine (PHENERGAN) 25 MG tablet promethazine 25 mg tablet  TAKE 1 TABLET (25 MG TOTAL) BY MOUTH EVERY 6 (SIX) HOURS AS NEEDED FOR NAUSEA OR VOMITING.    [provider]    Allergies as of 05/08/2017 - Review Complete 05/08/2017  Allergen Reaction Noted  . Nsaids Other (See Comments) 04/21/2014  . Sulfa antibiotics Rash 04/21/2014     Family History  Problem Relation Age of Onset  . COPD Mother   . Heart disease Mother   . COPD Father   . Emphysema Father   . Prostate cancer Father   . COPD Sister   . Heart disease Sister     Social History   Social History  . Marital status: Widowed    Spouse name: N/A  . Number of children: N/A  . Years of education: N/A   Occupational History  . Cashier    Social History Main Topics  . Smoking status: Former Smoker    Packs/day: 1.00    Years: 10.00    Types: Cigarettes    Quit date: 11/18/2012  . Smokeless tobacco: Never Used  . Alcohol use 0.0 oz/week     Comment: every once in a while  . Drug use: No  . Sexual activity: Not Currently   Other Topics Concern  . Not on file   Social History Narrative  . No narrative on file    Review of Systems: See HPI, otherwise negative ROS  Physical Exam: BP 119/62   Pulse 75   Ht 5' 7"  (1.702 m)   Wt 184 lb (83.5 kg)   SpO2 94%   BMI 28.82 kg/m  General:   Alert,  pleasant and cooperative in NAD Head:  Normocephalic and atraumatic. Neck:  Supple; no masses or thyromegaly. Lungs:  Clear throughout to auscultation.    Heart:  Regular rate and rhythm. Abdomen:  Soft, nontender and nondistended. Normal bowel sounds, without guarding, and without rebound.   Neurologic:  Alert and  oriented x4;  grossly normal neurologically.  Impression/Plan: Kimberly Hudson is here for an endoscopy to be performed for dysphagia  Risks, benefits, limitations, and alternatives regarding  endoscopy have been reviewed with the patient.  Questions have been answered.  All parties agreeable.   Lucilla Lame, MD  05/29/2017, 11:32 AM

## 2017-05-30 ENCOUNTER — Encounter: Payer: Self-pay | Admitting: Gastroenterology

## 2017-05-30 ENCOUNTER — Other Ambulatory Visit: Payer: Self-pay | Admitting: Family Medicine

## 2017-05-30 ENCOUNTER — Other Ambulatory Visit: Payer: Self-pay

## 2017-05-30 DIAGNOSIS — K50919 Crohn's disease, unspecified, with unspecified complications: Secondary | ICD-10-CM

## 2017-05-30 MED ORDER — DIPHENOXYLATE-ATROPINE 2.5-0.025 MG PO TABS: 1.0000 | ORAL_TABLET | Freq: Four times a day (QID) | ORAL | 5 refills | Status: AC | PRN

## 2017-05-30 MED ORDER — PROMETHAZINE HCL 25 MG PO TABS: ORAL_TABLET | ORAL | 0 refills | Status: DC

## 2017-05-30 MED ORDER — HYDROCODONE-ACETAMINOPHEN 7.5-325 MG PO TABS: 1.0000 | ORAL_TABLET | Freq: Four times a day (QID) | ORAL | 0 refills | Status: DC | PRN

## 2017-05-30 MED ORDER — DICYCLOMINE HCL 20 MG PO TABS: 20.0000 mg | ORAL_TABLET | Freq: Three times a day (TID) | ORAL | 5 refills | Status: DC

## 2017-06-02 ENCOUNTER — Other Ambulatory Visit: Payer: Self-pay | Admitting: Family Medicine

## 2017-06-02 MED ORDER — FLUCONAZOLE 100 MG PO TABS: 100.0000 mg | ORAL_TABLET | Freq: Every day | ORAL | 1 refills | Status: DC

## 2017-06-04 ENCOUNTER — Other Ambulatory Visit: Payer: Self-pay | Admitting: Family Medicine

## 2017-06-04 DIAGNOSIS — S39012A Strain of muscle, fascia and tendon of lower back, initial encounter: Secondary | ICD-10-CM

## 2017-06-12 ENCOUNTER — Other Ambulatory Visit: Payer: Self-pay | Admitting: Family Medicine

## 2017-06-13 MED ORDER — HYDROCODONE-ACETAMINOPHEN 7.5-325 MG PO TABS
1.0000 | ORAL_TABLET | Freq: Four times a day (QID) | ORAL | 0 refills | Status: DC | PRN
Start: 2017-06-13 — End: 2017-06-27

## 2017-06-26 ENCOUNTER — Other Ambulatory Visit: Payer: Self-pay | Admitting: Family Medicine

## 2017-06-27 ENCOUNTER — Other Ambulatory Visit: Payer: Self-pay | Admitting: Family Medicine

## 2017-06-27 MED ORDER — HYDROCODONE-ACETAMINOPHEN 7.5-325 MG PO TABS: 1.0000 | ORAL_TABLET | Freq: Four times a day (QID) | ORAL | 0 refills | Status: DC | PRN

## 2017-06-30 MED ORDER — HYDROCODONE-ACETAMINOPHEN 7.5-325 MG PO TABS: 1.0000 | ORAL_TABLET | Freq: Four times a day (QID) | ORAL | 0 refills | Status: DC | PRN

## 2017-07-10 ENCOUNTER — Other Ambulatory Visit: Payer: Self-pay | Admitting: Family Medicine

## 2017-07-11 MED ORDER — HYDROCODONE-ACETAMINOPHEN 7.5-325 MG PO TABS: 1.0000 | ORAL_TABLET | Freq: Four times a day (QID) | ORAL | 0 refills | Status: DC | PRN

## 2017-07-11 NOTE — Telephone Encounter (Signed)
Patient called back about this. She will run out over the weekend she said. Below came from patient through my chart: HYDROcodone-acetaminophen ([place]) 7.5-325 MG tablet Lelon Huh, MD]   Patient Comment: Please check the date on my refill. I filled my script on Friday 8/10. Then on Monday 8/13 I got a call from the office telling me that my script was ready to pick up. I told the nurse I already had my script. She looked on the computer and saw this was a duplicate. Thank you if you need to call me my cell is [phone].

## 2017-07-24 ENCOUNTER — Other Ambulatory Visit: Payer: Self-pay | Admitting: Family Medicine

## 2017-07-24 MED ORDER — HYDROCODONE-ACETAMINOPHEN 7.5-325 MG PO TABS: 1.0000 | ORAL_TABLET | Freq: Four times a day (QID) | ORAL | 0 refills | Status: DC | PRN

## 2017-07-28 ENCOUNTER — Other Ambulatory Visit: Payer: Self-pay | Admitting: Gastroenterology

## 2017-07-28 DIAGNOSIS — K50919 Crohn's disease, unspecified, with unspecified complications: Secondary | ICD-10-CM

## 2017-07-29 ENCOUNTER — Encounter: Payer: Self-pay | Admitting: Family Medicine

## 2017-07-29 ENCOUNTER — Ambulatory Visit (INDEPENDENT_AMBULATORY_CARE_PROVIDER_SITE_OTHER): Payer: BLUE CROSS/BLUE SHIELD | Admitting: Family Medicine

## 2017-07-29 VITALS — BP 140/54 | HR 75 | Temp 98.5°F | Resp 16 | Ht 67.0 in | Wt 190.8 lb

## 2017-07-29 DIAGNOSIS — K50919 Crohn's disease, unspecified, with unspecified complications: Secondary | ICD-10-CM

## 2017-07-29 DIAGNOSIS — S39012A Strain of muscle, fascia and tendon of lower back, initial encounter: Secondary | ICD-10-CM

## 2017-07-29 DIAGNOSIS — E785 Hyperlipidemia, unspecified: Secondary | ICD-10-CM | POA: Diagnosis not present

## 2017-07-29 DIAGNOSIS — Z85828 Personal history of other malignant neoplasm of skin: Secondary | ICD-10-CM | POA: Diagnosis not present

## 2017-07-29 DIAGNOSIS — M19012 Primary osteoarthritis, left shoulder: Secondary | ICD-10-CM

## 2017-07-29 DIAGNOSIS — D899 Disorder involving the immune mechanism, unspecified: Secondary | ICD-10-CM

## 2017-07-29 DIAGNOSIS — M1711 Unilateral primary osteoarthritis, right knee: Secondary | ICD-10-CM

## 2017-07-29 DIAGNOSIS — E611 Iron deficiency: Secondary | ICD-10-CM | POA: Diagnosis not present

## 2017-07-29 DIAGNOSIS — Z23 Encounter for immunization: Secondary | ICD-10-CM

## 2017-07-29 DIAGNOSIS — D849 Immunodeficiency, unspecified: Secondary | ICD-10-CM

## 2017-07-29 DIAGNOSIS — L989 Disorder of the skin and subcutaneous tissue, unspecified: Secondary | ICD-10-CM

## 2017-07-29 DIAGNOSIS — I10 Essential (primary) hypertension: Secondary | ICD-10-CM | POA: Diagnosis not present

## 2017-07-29 LAB — COMPLETE METABOLIC PANEL WITH GFR
AG RATIO: 1.1 (calc) (ref 1.0–2.5)
ALBUMIN MSPROF: 3.1 g/dL — AB (ref 3.6–5.1)
ALKALINE PHOSPHATASE (APISO): 73 U/L (ref 33–130)
ALT: 7 U/L (ref 6–29)
AST: 15 U/L (ref 10–35)
BILIRUBIN TOTAL: 0.3 mg/dL (ref 0.2–1.2)
BUN: 13 mg/dL (ref 7–25)
CHLORIDE: 105 mmol/L (ref 98–110)
CO2: 29 mmol/L (ref 20–32)
Calcium: 8.2 mg/dL — ABNORMAL LOW (ref 8.6–10.4)
Creat: 0.92 mg/dL (ref 0.50–0.99)
GFR, EST AFRICAN AMERICAN: 78 mL/min/{1.73_m2} (ref >=60)
GFR, Est Non African American: 68 mL/min/{1.73_m2} (ref >=60)
Globulin: 2.8 g/dL (calc) (ref 1.9–3.7)
Glucose, Bld: 95 mg/dL (ref 65–99)
POTASSIUM: 3.7 mmol/L (ref 3.5–5.3)
Sodium: 141 mmol/L (ref 135–146)
TOTAL PROTEIN: 5.9 g/dL — AB (ref 6.1–8.1)

## 2017-07-29 LAB — CBC
HCT: 26.8 % — ABNORMAL LOW (ref 35.0–45.0)
Hemoglobin: 9 g/dL — ABNORMAL LOW (ref 11.7–15.5)
MCH: 33.2 pg — AB (ref 27.0–33.0)
MCHC: 33.6 g/dL (ref 32.0–36.0)
MCV: 98.9 fL (ref 80.0–100.0)
MPV: 9.4 fL (ref 7.5–12.5)
PLATELETS: 272 10*3/uL (ref 140–400)
RBC: 2.71 10*6/uL — ABNORMAL LOW (ref 3.80–5.10)
RDW: 17.1 % — AB (ref 11.0–15.0)
WBC: 3.8 10*3/uL (ref 3.8–10.8)

## 2017-07-29 LAB — FERRITIN: Ferritin: 11 ng/mL — ABNORMAL LOW (ref 20–288)

## 2017-07-29 LAB — LIPID PANEL
CHOLESTEROL: 276 mg/dL — AB (ref <200)
HDL: 42 mg/dL — ABNORMAL LOW (ref >50)
LDL Cholesterol (Calc): 189 mg/dL (calc) — ABNORMAL HIGH
Non-HDL Cholesterol (Calc): 234 mg/dL (calc) — ABNORMAL HIGH (ref <130)
Total CHOL/HDL Ratio: 6.6 (calc) — ABNORMAL HIGH (ref <5.0)
Triglycerides: 250 mg/dL — ABNORMAL HIGH (ref <150)

## 2017-07-29 MED ORDER — CYCLOBENZAPRINE HCL 5 MG PO TABS: 5.0000 mg | ORAL_TABLET | Freq: Three times a day (TID) | ORAL | 5 refills | Status: DC | PRN

## 2017-07-29 MED ORDER — LISINOPRIL-HYDROCHLOROTHIAZIDE 10-12.5 MG PO TABS: ORAL_TABLET | ORAL | 0 refills | Status: DC

## 2017-07-29 MED ORDER — SERTRALINE HCL 100 MG PO TABS: 200.0000 mg | ORAL_TABLET | Freq: Every day | ORAL | 4 refills | Status: DC

## 2017-07-29 MED ORDER — HYDROCODONE-ACETAMINOPHEN 7.5-325 MG PO TABS
1.0000 | ORAL_TABLET | Freq: Four times a day (QID) | ORAL | 0 refills | Status: DC | PRN
Start: 2017-07-29 — End: 2017-09-04

## 2017-07-29 NOTE — Progress Notes (Signed)
Patient: Kimberly Hudson Female    DOB: 1956/05/09   61 y.o.   MRN: [id number] Visit Date: 07/29/2017  Today's Provider: Lelon Huh, MD   Chief Complaint  Patient presents with  . Follow-up  . Hypertension  . Hyperlipidemia  . Depression   Subjective:    HPI   Hypertension, follow-up:  BP Readings from Last 3 Encounters:  07/29/17 (!) 140/54  05/29/17 130/72  05/08/17 (!) 121/57    She was last seen for hypertension 10 months ago.  BP at that visit was 140/80. Management since that visit includes; no changes.She reports good compliance with treatment. She is not having side effects. none She is not exercising. She is adherent to low salt diet.   Outside blood pressures are normal. She is experiencing none.  Patient denies none.   Cardiovascular risk factors include none.  Use of agents associated with hypertension: none.   Has only been taking lisinopril-hctz QOD for the last month or so because her BP has been low and gets light headed when standing up quickly. Last dose was yesterday ----------------------------------------------------------------     Lipid/Cholesterol, Follow-up:   Last seen for this 05/09/2016.  Management since that visit includes; labs checked, no changes.  Last Lipid Panel:    Component Value Date/Time   CHOL 205 (H) 05/09/2016 1239   TRIG 182 (H) 05/09/2016 1239   HDL 48 05/09/2016 1239   CHOLHDL 4.3 05/09/2016 1239   LDLCALC 121 (H) 05/09/2016 1239    She reports good compliance with treatment. She is not having side effects. none  Wt Readings from Last 3 Encounters:  07/29/17 190 lb 12.8 oz (86.5 kg)  05/29/17 184 lb (83.5 kg)  05/08/17 190 lb (86.2 kg)    ----------------------------------------------------------------  Depression From 05/09/2016-no changes. Stable on current dose of sertraline. Has recently increased to 2 x 113m due to much more stress the last several months.   Also states that clonazepam  is only helping for a few hours, so she occasionally takes another 1/2 tablet when she wakes up in middle of night.   Anemia, unspecified type From 10/16/2016-labs checked. Iron levels still low. Continue taking iron supplements.    Is having more knee pain and pains in right shoulder and hip, taking hydrocodone/apap 7.5/325 which is effective for 4-5 hours and usually taking 4-5 a day.    [facility] No injections were done but was told to return if worsens.    She also has sore on her right shoulder for about 3 months that won't heal. She has a history of BCC excised by Dr. GVerner Cholin 2014. She has not had recent skin exam.   [facility]. [facility] but I do not have records of their evaluation. In any event, she reports it has been a few years since she had gyn follow up.   Allergies  Allergen Reactions  . Nsaids Other (See Comments)    Because of Crohns Because of Crohns  . Sulfa Antibiotics Rash     Current Outpatient Prescriptions:  .  azaTHIOprine (IMURAN) 50 MG tablet, azathioprine 50 mg tablet  TAKE 3 TABLETS (150 MG TOTAL) BY MOUTH DAILY., Disp: , Rfl:  .  clonazePAM (KLONOPIN) 1 MG tablet, TAKE 1 TABLET BY MOUTH AT BEDTIME, Disp:  30 tablet, Rfl: 5 .  cyclobenzaprine (FLEXERIL) 5 MG tablet, TAKE 1 TABLET BY MOUTH THREE TIMES A DAY AS NEEDED FOR MUSCLE SPASMS, Disp: 30 tablet, Rfl: 5 .  dicyclomine (BENTYL) 20 MG tablet, Take 1 tablet (20 mg total) by mouth 3 (three) times daily before meals., Disp: 90 tablet, Rfl: 5 .  diphenoxylate-atropine (LOMOTIL) 2.5-0.025 MG tablet, Take 1 tablet by mouth 4 (four) times daily as needed for diarrhea or loose stools., Disp: 120 tablet, Rfl: 5 .  HYDROcodone-acetaminophen (NORCO) 7.5-325 MG tablet, Take 1 tablet by mouth every 6 (six) hours as needed for moderate  pain., Disp: 60 tablet, Rfl: 0 .  lisinopril-hydrochlorothiazide (PRINZIDE,ZESTORETIC) 10-12.5 MG tablet, Take 1 tablet by mouth daily., Disp: 90 tablet, Rfl: 4 .  pantoprazole (PROTONIX) 40 MG tablet, TAKE 1 TABLET (40 MG TOTAL) BY MOUTH 2 (TWO) TIMES DAILY., Disp: 90 tablet, Rfl: 1 .  pravastatin (PRAVACHOL) 10 MG tablet, Take by mouth., Disp: , Rfl:  .  promethazine (PHENERGAN) 25 MG tablet, promethazine 25 mg tablet  TAKE 1 TABLET (25 MG TOTAL) BY MOUTH EVERY 6 (SIX) HOURS AS NEEDED FOR NAUSEA OR VOMITING., Disp: 30 tablet, Rfl: 0 .  sertraline (ZOLOFT) 100 MG tablet, Take 1.5 tablets (150 mg total) by mouth daily., Disp: 135 tablet, Rfl: 4  Review of Systems  Constitutional: Negative for appetite change, chills, fatigue and fever.  Respiratory: Negative for chest tightness and shortness of breath.   Cardiovascular: Negative for chest pain and palpitations.  Gastrointestinal: Negative for abdominal pain, nausea and vomiting.  Neurological: Negative for dizziness and weakness.    Social History  Substance Use Topics  . Smoking status: Former Smoker    Packs/day: 1.00    Years: 10.00    Types: Cigarettes    Quit date: 11/18/2012  . Smokeless tobacco: Never Used  . Alcohol use 0.0 oz/week     Comment: every once in a while   Objective:   BP (!) 140/54 (BP Location: Right Arm, Patient Position: Sitting, Cuff Size: Large)   Pulse 75   Temp 98.5 F (36.9 C) (Oral)   Resp 16   Ht 5' 7"  (1.702 m)   Wt 190 lb 12.8 oz (86.5 kg)   SpO2 99%   BMI 29.88 kg/m  Vitals:   07/29/17 0816  BP: (!) 140/54  Pulse: 75  Resp: 16  Temp: 98.5 F (36.9 C)  TempSrc: Oral  SpO2: 99%  Weight: 190 lb 12.8 oz (86.5 kg)  Height: 5' 7"  (1.702 m)     Physical Exam   General Appearance:    Alert, cooperative, no distress  Eyes:    PERRL, conjunctiva/corneas clear, EOM's intact       Lungs:     Clear to auscultation bilaterally, respirations unlabored  Heart:    Regular rate and rhythm    Neurologic:   Awake, alert, oriented x 3. No apparent focal neurological           defect.   Skin:   About 72m round erosive lesion left shoulder, no surrounding erythema. Small amount of serous discharge.        Assessment & Plan:     1. Primary osteoarthritis of right knee Has been consistently taking hydrocodone/apap for long period of time without escalation of dose. Will change to 1 month supply. Counseled on potential for opioid abuse and dependency and to follow up with ortho if she feels pain does not continue to be adequately controlled on current medications.  -  HYDROcodone-acetaminophen (NORCO) 7.5-325 MG tablet; Take 1 tablet by mouth every 6 (six) hours as needed for moderate pain.  Dispense: 120 tablet; Refill: 0  2. Crohn's disease with complication, unspecified gastrointestinal tract location [facility]) Continue routine follow up Dr. Allen Norris.   3. Strain of lumbar region,  She has been taking muscle relaxers consistently which she feel is effective.  - cyclobenzaprine (FLEXERIL) 5 MG tablet; Take 1 tablet (5 mg total) by mouth 3 (three) times daily as needed for muscle spasms.  Dispense: 90 tablet; Refill: 5  4. Essential hypertension Has been taking every other day due to dizziness and low BP at home. Advised that this will cause labile BP and to reduced to 1/2 tablet daily instead. Advised we can change to lower strength tablet if this is effective.  - lisinopril-hydrochlorothi.azide (PRINZIDE,ZESTORETIC) 10-12.5 MG tablet; 1/2 tablet daily  Dispense: 3 tablet; Refill: 0  5. Hyperlipidemia, unspecified hyperlipidemia type She is tolerating pravastatin well with no adverse effects.   - COMPLETE METABOLIC PANEL WITH GFR - Lipid panel  6. Immunosuppression ([place]) On Imuran for crohn's  - CBC  7. Iron deficiency  - Ferritin  8. Need for influenza vaccination  - Flu Vaccine QUAD 36+ mos IM  9. Need for shingles vaccine  - Varicella-zoster vaccine IM (Shingrix)  10.  Skin lesion of right arm She would like to establish with local dermatologist - Ambulatory referral to Dermatology  11. History of basal cell cancer   12. Localized osteoarthrosis of left shoulder region  Counseled on importance of regular gyn exam and pap screening considering her abnormal pap in 2014. She states she will call [facility] to schedule follow up.    Addressed extensive list of chronic and acute medical problems today requiring extensive time in counseling and coordination care.  Over half of this 45 minute visit were spent in counseling and coordinating care of multiple medical problems.       Lelon Huh, MD  [facility]

## 2017-07-29 NOTE — Patient Instructions (Addendum)
   You are due for follow up with your gynecologist. Please call [facility] to schedule follow up as soon as possible   Reduce dose of lisinopril to 1/2 tablet daily

## 2017-07-30 ENCOUNTER — Other Ambulatory Visit: Payer: Self-pay | Admitting: Family Medicine

## 2017-07-30 MED ORDER — PRAVASTATIN SODIUM 10 MG PO TABS: 10.0000 mg | ORAL_TABLET | Freq: Every day | ORAL | 1 refills | Status: DC

## 2017-08-01 ENCOUNTER — Other Ambulatory Visit: Payer: Self-pay | Admitting: Gastroenterology

## 2017-08-01 DIAGNOSIS — K50919 Crohn's disease, unspecified, with unspecified complications: Secondary | ICD-10-CM

## 2017-08-06 ENCOUNTER — Telehealth: Payer: Self-pay

## 2017-08-06 NOTE — Telephone Encounter (Signed)
lmtcb

## 2017-08-06 NOTE — Telephone Encounter (Signed)
Patient advised as below. Patient verbalizes understanding and is in agreement with treatment plan.  

## 2017-08-06 NOTE — Telephone Encounter (Signed)
-----   Message from Birdie Sons, MD sent at 07/30/2017 12:59 PM EDT ----- Hemoglobin is down to 9.0. Iron level is very low at 11 (should be 20-20). Cholesterol is VERY high at 276. If she is taking an iron supplement then she needs to double it, if not taking supplement she needs to start taking 332m a day  Need to start back pravastatin. [facility] Need to schedule follow up o.v. In 3 months.

## 2017-08-07 ENCOUNTER — Other Ambulatory Visit: Payer: Self-pay | Admitting: Family Medicine

## 2017-08-07 DIAGNOSIS — K50919 Crohn's disease, unspecified, with unspecified complications: Secondary | ICD-10-CM

## 2017-08-07 NOTE — Telephone Encounter (Signed)
[facility] called [facility] [address] and was told that they did not receive refill sent on 07/30/2017. I then verbally called prescription into the pharmacy.Patient advised.

## 2017-08-07 NOTE — Telephone Encounter (Signed)
She needs to get this from her GI

## 2017-08-07 NOTE — Telephone Encounter (Signed)
Pt contacted office for refill request on the following medications:  pravastatin (PRAVACHOL) 10 MG tablet  [facility] [address]  JS#[phone].  [facility] states [facility] not [facility]

## 2017-08-08 ENCOUNTER — Other Ambulatory Visit: Payer: Self-pay

## 2017-08-08 MED ORDER — PRAVASTATIN SODIUM 10 MG PO TABS: 10.0000 mg | ORAL_TABLET | Freq: Every day | ORAL | 1 refills | Status: DC

## 2017-08-08 NOTE — Telephone Encounter (Signed)
Pharmacy sent fax saying that they never received a refill on pravastatin. Please resend. Thanks!

## 2017-08-08 NOTE — Telephone Encounter (Signed)
lmtcb-Anastasiya Estell Harpin, RMA

## 2017-08-11 ENCOUNTER — Telehealth: Payer: Self-pay | Admitting: Gastroenterology

## 2017-08-11 ENCOUNTER — Other Ambulatory Visit: Payer: Self-pay | Admitting: Family Medicine

## 2017-08-11 ENCOUNTER — Other Ambulatory Visit: Payer: Self-pay | Admitting: Gastroenterology

## 2017-08-11 DIAGNOSIS — K50919 Crohn's disease, unspecified, with unspecified complications: Secondary | ICD-10-CM

## 2017-08-11 NOTE — Telephone Encounter (Signed)
Asatropine / imuran [facility] [address] Patient needs this RX filled and would like for you to call her

## 2017-08-12 NOTE — Telephone Encounter (Signed)
Left vm letting pt know Imuran was refilled on 08/11/17.

## 2017-08-26 ENCOUNTER — Other Ambulatory Visit: Payer: Self-pay | Admitting: Family Medicine

## 2017-08-29 ENCOUNTER — Other Ambulatory Visit: Payer: Self-pay | Admitting: Family Medicine

## 2017-08-29 NOTE — Telephone Encounter (Signed)
Please review. Thanks!  

## 2017-09-01 ENCOUNTER — Other Ambulatory Visit: Payer: Self-pay | Admitting: Gastroenterology

## 2017-09-02 ENCOUNTER — Other Ambulatory Visit: Payer: Self-pay | Admitting: Family Medicine

## 2017-09-03 NOTE — Telephone Encounter (Signed)
Please call in clonazepam

## 2017-09-03 NOTE — Telephone Encounter (Signed)
Rx called in to pharmacy. 

## 2017-09-04 ENCOUNTER — Other Ambulatory Visit: Payer: Self-pay | Admitting: Family Medicine

## 2017-09-04 DIAGNOSIS — M1711 Unilateral primary osteoarthritis, right knee: Secondary | ICD-10-CM

## 2017-09-04 MED ORDER — HYDROCODONE-ACETAMINOPHEN 7.5-325 MG PO TABS: 1.0000 | ORAL_TABLET | Freq: Four times a day (QID) | ORAL | 0 refills | Status: DC | PRN

## 2017-09-19 ENCOUNTER — Encounter: Payer: Self-pay | Admitting: Physician Assistant

## 2017-09-19 ENCOUNTER — Ambulatory Visit (INDEPENDENT_AMBULATORY_CARE_PROVIDER_SITE_OTHER): Payer: Disability Insurance | Admitting: Physician Assistant

## 2017-09-19 VITALS — BP 90/60 | HR 90 | Temp 98.7°F | Resp 16 | Wt 187.0 lb

## 2017-09-19 DIAGNOSIS — J0140 Acute pansinusitis, unspecified: Secondary | ICD-10-CM

## 2017-09-19 MED ORDER — AZITHROMYCIN 250 MG PO TABS: ORAL_TABLET | ORAL | 1 refills | Status: DC

## 2017-09-19 NOTE — Progress Notes (Signed)
Patient: Kimberly Hudson Female    DOB: 1956-08-02   61 y.o.   MRN: [id number] Visit Date: 09/19/2017  Today's Provider: Mar Daring, PA-C   Chief Complaint  Patient presents with  . Cough   Subjective:    Cough  This is a new problem. The current episode started in the past 7 days. The problem has been gradually worsening. The problem occurs constantly. The cough is non-productive. Associated symptoms include headaches, nasal congestion and a sore throat. Pertinent negatives include no chest pain, chills, ear congestion, ear pain, fever, postnasal drip, rhinorrhea, shortness of breath or wheezing. The symptoms are aggravated by other (Moving around). Treatments tried: allergy tablets and Phenergan. The treatment provided no relief. Her past medical history is significant for bronchitis.      Allergies  Allergen Reactions  . Nsaids Other (See Comments)    Because of Crohns Because of Crohns  . Sulfa Antibiotics Rash     Current Outpatient Prescriptions:  .  azaTHIOprine (IMURAN) 50 MG tablet, TAKE 3 TABLETS (150 MG TOTAL) BY MOUTH DAILY., Disp: 90 tablet, Rfl: 11 .  clonazePAM (KLONOPIN) 1 MG tablet, TAKE 1 TABLET BY MOUTH AT BEDTIME, Disp: 30 tablet, Rfl: 5 .  cyclobenzaprine (FLEXERIL) 5 MG tablet, Take 1 tablet (5 mg total) by mouth 3 (three) times daily as needed for muscle spasms., Disp: 90 tablet, Rfl: 5 .  dicyclomine (BENTYL) 20 MG tablet, Take 1 tablet (20 mg total) by mouth 3 (three) times daily before meals., Disp: 90 tablet, Rfl: 5 .  diphenoxylate-atropine (LOMOTIL) 2.5-0.025 MG tablet, Take 1 tablet by mouth 4 (four) times daily as needed for diarrhea or loose stools., Disp: 120 tablet, Rfl: 5 .  HYDROcodone-acetaminophen (NORCO) 7.5-325 MG tablet, Take 1 tablet by mouth every 6 (six) hours as needed for moderate pain., Disp: 120 tablet, Rfl: 0 .  lisinopril-hydrochlorothiazide (PRINZIDE,ZESTORETIC) 10-12.5 MG tablet, 1/2 tablet daily, Disp: 3 tablet,  Rfl: 0 .  pantoprazole (PROTONIX) 40 MG tablet, TAKE 1 TABLET (40 MG TOTAL) BY MOUTH 2 (TWO) TIMES DAILY., Disp: 90 tablet, Rfl: 1 .  pravastatin (PRAVACHOL) 10 MG tablet, Take 1 tablet (10 mg total) by mouth daily., Disp: 90 tablet, Rfl: 1 .  promethazine (PHENERGAN) 25 MG tablet, promethazine 25 mg tablet  TAKE 1 TABLET (25 MG TOTAL) BY MOUTH EVERY 6 (SIX) HOURS AS NEEDED FOR NAUSEA OR VOMITING., Disp: 30 tablet, Rfl: 0 .  sertraline (ZOLOFT) 100 MG tablet, Take 2 tablets (200 mg total) by mouth daily., Disp: 60 tablet, Rfl: 4  Review of Systems  Constitutional: Negative for chills and fever.  HENT: Positive for congestion, sinus pressure, sore throat and voice change. Negative for ear discharge, ear pain, postnasal drip, rhinorrhea, sinus pain, sneezing and trouble swallowing.   Eyes: Negative.   Respiratory: Positive for cough. Negative for chest tightness, shortness of breath and wheezing.   Cardiovascular: Negative for chest pain.  Gastrointestinal: Negative for abdominal pain, diarrhea, nausea and vomiting.  Neurological: Positive for headaches.    Social History  Substance Use Topics  . Smoking status: Former Smoker    Packs/day: 1.00    Years: 10.00    Types: Cigarettes    Quit date: 11/18/2012  . Smokeless tobacco: Never Used  . Alcohol use 0.0 oz/week     Comment: every once in a while   Objective:   BP 90/60 (BP Location: Left Arm, Patient Position: Sitting, Cuff Size: Normal)   Pulse 90  Temp 98.7 F (37.1 C) (Oral)   Resp 16   Wt 187 lb (84.8 kg)   SpO2 98%   BMI 29.29 kg/m    Physical Exam  Constitutional: She appears well-developed and well-nourished. No distress.  HENT:  Head: Normocephalic and atraumatic.  Right Ear: Hearing, tympanic membrane, external ear and ear canal normal.  Left Ear: Hearing, tympanic membrane, external ear and ear canal normal.  Nose: Right sinus exhibits maxillary sinus tenderness and frontal sinus tenderness. Left sinus exhibits  maxillary sinus tenderness and frontal sinus tenderness.  Mouth/Throat: Uvula is midline, oropharynx is clear and moist and mucous membranes are normal. No oropharyngeal exudate.  Neck: Normal range of motion. Neck supple. No tracheal deviation present. No thyromegaly present.  Cardiovascular: Normal rate, regular rhythm and normal heart sounds.  Exam reveals no gallop and no friction rub.   No murmur heard. Pulmonary/Chest: Effort normal and breath sounds normal. No stridor. No respiratory distress. She has no wheezes. She has no rales.  Lymphadenopathy:    She has no cervical adenopathy.  Skin: She is not diaphoretic.  Vitals reviewed.       Assessment & Plan:     1. Acute pansinusitis, recurrence not specified Worsening symptoms that have not responded to OTC medications. Will give Zpak as below. Augmentin causes diarrhea which she already has at baseline due to Crohn's thhus patient prefers to not use first line. Continue allergy medications. Stay well hydrated and get plenty of rest. Call if no symptom improvement or if symptoms worsen. Did discuss for patient to call tomorrow if symptoms do not improve and will add prednisone taper.  - azithromycin (ZITHROMAX) 250 MG tablet; Take 2 tablets PO on day one, and one tablet PO daily thereafter until completed.  Dispense: 6 tablet; Refill: [place], PA-C  [facility]

## 2017-09-19 NOTE — Patient Instructions (Signed)
Delsym OTC for cough  Sinusitis, Adult Sinusitis is soreness and inflammation of your sinuses. Sinuses are hollow spaces in the bones around your face. Your sinuses are located:  Around your eyes.  In the middle of your forehead.  Behind your nose.  In your cheekbones.  Your sinuses and nasal passages are lined with a stringy fluid (mucus). Mucus normally drains out of your sinuses. When your nasal tissues become inflamed or swollen, the mucus can become trapped or blocked so air cannot flow through your sinuses. This allows bacteria, viruses, and funguses to grow, which leads to infection. Sinusitis can develop quickly and last for 7?10 days (acute) or for more than 12 weeks (chronic). Sinusitis often develops after a cold. What are the causes? This condition is caused by anything that creates swelling in the sinuses or stops mucus from draining, including:  Allergies.  Asthma.  Bacterial or viral infection.  Abnormally shaped bones between the nasal passages.  Nasal growths that contain mucus (nasal polyps).  Narrow sinus openings.  Pollutants, such as chemicals or irritants in the air.  A foreign object stuck in the nose.  A fungal infection. This is rare.  What increases the risk? The following factors may make you more likely to develop this condition:  Having allergies or asthma.  Having had a recent cold or respiratory tract infection.  Having structural deformities or blockages in your nose or sinuses.  Having a weak immune system.  Doing a lot of swimming or diving.  Overusing nasal sprays.  Smoking.  What are the signs or symptoms? The main symptoms of this condition are pain and a feeling of pressure around the affected sinuses. Other symptoms include:  Upper toothache.  Earache.  Headache.  Bad breath.  Decreased sense of smell and taste.  A cough that may get worse at night.  Fatigue.  Fever.  Thick drainage from your nose. The  drainage is often green and it may contain pus (purulent).  Stuffy nose or congestion.  Postnasal drip. This is when extra mucus collects in the throat or back of the nose.  Swelling and warmth over the affected sinuses.  Sore throat.  Sensitivity to light.  How is this diagnosed? This condition is diagnosed based on symptoms, a medical history, and a physical exam. To find out if your condition is acute or chronic, your health care provider may:  Look in your nose for signs of nasal polyps.  Tap over the affected sinus to check for signs of infection.  View the inside of your sinuses using an imaging device that has a light attached (endoscope).  If your health care provider suspects that you have chronic sinusitis, you may also:  Be tested for allergies.  Have a sample of mucus taken from your nose (nasal culture) and checked for bacteria.  Have a mucus sample examined to see if your sinusitis is related to an allergy.  If your sinusitis does not respond to treatment and it lasts longer than 8 weeks, you may have an MRI or CT scan to check your sinuses. These scans also help to determine how severe your infection is. In rare cases, a bone biopsy may be done to rule out more serious types of fungal sinus disease. How is this treated? Treatment for sinusitis depends on the cause and whether your condition is chronic or acute. If a virus is causing your sinusitis, your symptoms will go away on their own within 10 days. You may be given  medicines to relieve your symptoms, including:  Topical nasal decongestants. They shrink swollen nasal passages and let mucus drain from your sinuses.  Antihistamines. These drugs block inflammation that is triggered by allergies. This can help to ease swelling in your nose and sinuses.  Topical nasal corticosteroids. These are nasal sprays that ease inflammation and swelling in your nose and sinuses.  Nasal saline washes. These rinses can help  to get rid of thick mucus in your nose.  If your condition is caused by bacteria, you will be given an antibiotic medicine. If your condition is caused by a fungus, you will be given an antifungal medicine. Surgery may be needed to correct underlying conditions, such as narrow nasal passages. Surgery may also be needed to remove polyps. Follow these instructions at home: Medicines  Take, use, or apply over-the-counter and prescription medicines only as told by your health care provider. These may include nasal sprays.  If you were prescribed an antibiotic medicine, take it as told by your health care provider. Do not stop taking the antibiotic even if you start to feel better. Hydrate and Humidify  Drink enough water to keep your urine clear or pale yellow. Staying hydrated will help to thin your mucus.  Use a cool mist humidifier to keep the humidity level in your home above 50%.  Inhale steam for 10-15 minutes, 3-4 times a day or as told by your health care provider. You can do this in the bathroom while a hot shower is running.  Limit your exposure to cool or dry air. Rest  Rest as much as possible.  Sleep with your head raised (elevated).  Make sure to get enough sleep each night. General instructions  Apply a warm, moist washcloth to your face 3-4 times a day or as told by your health care provider. This will help with discomfort.  Wash your hands often with soap and water to reduce your exposure to viruses and other germs. If soap and water are not available, use hand sanitizer.  Do not smoke. Avoid being around people who are smoking (secondhand smoke).  Keep all follow-up visits as told by your health care provider. This is important. Contact a health care provider if:  You have a fever.  Your symptoms get worse.  Your symptoms do not improve within 10 days. Get help right away if:  You have a severe headache.  You have persistent vomiting.  You have pain or  swelling around your face or eyes.  You have vision problems.  You develop confusion.  Your neck is stiff.  You have trouble breathing. This information is not intended to replace advice given to you by your health care provider. Make sure you discuss any questions you have with your health care provider. Document Released: 11/04/2005 Document Revised: 06/30/2016 Document Reviewed: 08/30/2015 Elsevier Interactive Patient Education  2017 [employer].

## 2017-09-30 ENCOUNTER — Ambulatory Visit: Payer: Disability Insurance | Admitting: Family Medicine

## 2017-09-30 ENCOUNTER — Encounter: Payer: Self-pay | Admitting: Family Medicine

## 2017-09-30 VITALS — BP 100/60 | HR 92 | Temp 99.5°F | Resp 16

## 2017-09-30 DIAGNOSIS — Z23 Encounter for immunization: Secondary | ICD-10-CM

## 2017-09-30 DIAGNOSIS — J40 Bronchitis, not specified as acute or chronic: Secondary | ICD-10-CM

## 2017-09-30 DIAGNOSIS — R059 Cough, unspecified: Secondary | ICD-10-CM

## 2017-09-30 DIAGNOSIS — R05 Cough: Secondary | ICD-10-CM

## 2017-09-30 MED ORDER — DOXYCYCLINE HYCLATE 100 MG PO TABS: 100.0000 mg | ORAL_TABLET | Freq: Two times a day (BID) | ORAL | 0 refills | Status: AC

## 2017-09-30 MED ORDER — HYDROCODONE-HOMATROPINE 5-1.5 MG/5ML PO SYRP: 5.0000 mL | ORAL_SOLUTION | Freq: Three times a day (TID) | ORAL | 0 refills | Status: DC | PRN

## 2017-09-30 NOTE — Progress Notes (Signed)
Patient: Kimberly Hudson Female    DOB: August 29, 1956   61 y.o.   MRN: [id number] Visit Date: 09/30/2017  Today's Provider: Lelon Huh, MD   Chief Complaint  Patient presents with  . Cough   Subjective:    Cough  This is a recurrent problem. The current episode started 1 to 4 weeks ago (3 weeks). The problem has been unchanged. The problem occurs every few minutes. The cough is productive of sputum. Associated symptoms include headaches, myalgias, shortness of breath and sweats. Pertinent negatives include no chest pain, chills, ear congestion, ear pain, fever, heartburn, hemoptysis, nasal congestion, postnasal drip, rash, rhinorrhea, sore throat, weight loss or wheezing. The symptoms are aggravated by lying down and exercise. She has tried OTC cough suppressant (Zpak, Delsym, Robitussin ) for the symptoms. The treatment provided mild relief. Her past medical history is significant for bronchitis.    Patient saw Grace Bushy 09/19/2017 sinusitis. Patient was given a Zpak. Patient is still having cugh and chest congestion x3 weeks. Patient also advised to take otc Delsym. Patient takes she completed Zpak, delsym and Robitussin, sinus pressure and drainage have resolved, but still has cough. Other symptoms include chest congestion, slight sob, headache and slight muscle ache. Cough is worse at night and keeping her awaking, productive small amount of yellow and green mucous in the morning. No dyspnea, no chest pain.    Allergies  Allergen Reactions  . Nsaids Other (See Comments)    Because of Crohns Because of Crohns  . Sulfa Antibiotics Rash     Current Outpatient Medications:  .  azaTHIOprine (IMURAN) 50 MG tablet, TAKE 3 TABLETS (150 MG TOTAL) BY MOUTH DAILY., Disp: 90 tablet, Rfl: 11 .  clonazePAM (KLONOPIN) 1 MG tablet, TAKE 1 TABLET BY MOUTH AT BEDTIME, Disp: 30 tablet, Rfl: 5 .  cyclobenzaprine (FLEXERIL) 5 MG tablet, Take 1 tablet (5 mg total) by mouth 3 (three) times  daily as needed for muscle spasms., Disp: 90 tablet, Rfl: 5 .  dicyclomine (BENTYL) 20 MG tablet, Take 1 tablet (20 mg total) by mouth 3 (three) times daily before meals., Disp: 90 tablet, Rfl: 5 .  diphenoxylate-atropine (LOMOTIL) 2.5-0.025 MG tablet, Take 1 tablet by mouth 4 (four) times daily as needed for diarrhea or loose stools., Disp: 120 tablet, Rfl: 5 .  HYDROcodone-acetaminophen (NORCO) 7.5-325 MG tablet, Take 1 tablet by mouth every 6 (six) hours as needed for moderate pain., Disp: 120 tablet, Rfl: 0 .  lisinopril-hydrochlorothiazide (PRINZIDE,ZESTORETIC) 10-12.5 MG tablet, 1/2 tablet daily, Disp: 3 tablet, Rfl: 0 .  pantoprazole (PROTONIX) 40 MG tablet, TAKE 1 TABLET (40 MG TOTAL) BY MOUTH 2 (TWO) TIMES DAILY., Disp: 90 tablet, Rfl: 1 .  pravastatin (PRAVACHOL) 10 MG tablet, Take 1 tablet (10 mg total) by mouth daily., Disp: 90 tablet, Rfl: 1 .  promethazine (PHENERGAN) 25 MG tablet, promethazine 25 mg tablet  TAKE 1 TABLET (25 MG TOTAL) BY MOUTH EVERY 6 (SIX) HOURS AS NEEDED FOR NAUSEA OR VOMITING., Disp: 30 tablet, Rfl: 0 .  sertraline (ZOLOFT) 100 MG tablet, Take 2 tablets (200 mg total) by mouth daily., Disp: 60 tablet, Rfl: 4 .  azithromycin (ZITHROMAX) 250 MG tablet, Take 2 tablets PO on day one, and one tablet PO daily thereafter until completed. (Patient not taking: Reported on 09/30/2017), Disp: 6 tablet, Rfl: 1  Review of Systems  Constitutional: Negative for appetite change, chills, fatigue, fever and weight loss.  HENT: Negative for ear pain, postnasal drip, rhinorrhea  and sore throat.   Respiratory: Positive for cough and shortness of breath. Negative for hemoptysis, chest tightness and wheezing.   Cardiovascular: Negative for chest pain and palpitations.  Gastrointestinal: Negative for abdominal pain, heartburn, nausea and vomiting.  Musculoskeletal: Positive for myalgias.  Skin: Negative for rash.  Neurological: Positive for headaches. Negative for dizziness and  weakness.    Social History   Tobacco Use  . Smoking status: Former Smoker    Packs/day: 1.00    Years: 10.00    Pack years: 10.00    Types: Cigarettes    Last attempt to quit: 11/18/2012    Years since quitting: 4.8  . Smokeless tobacco: Never Used  Substance Use Topics  . Alcohol use: Yes    Alcohol/week: 0.0 oz    Comment: every once in a while   Objective:   BP 100/60 (BP Location: Left Arm, Patient Position: Sitting, Cuff Size: Large)   Pulse 92   Temp 99.5 F (37.5 C) (Oral)   Resp 16   SpO2 97%  Vitals:   09/30/17 1019  BP: 100/60  Pulse: 92  Resp: 16  Temp: 99.5 F (37.5 C)  TempSrc: Oral  SpO2: 97%     Physical Exam  General Appearance:    Alert, cooperative, no distress  HENT:   bilateral TM normal without fluid or infection, neck without nodes, sinuses nontender and nasal mucosa congested  Eyes:    PERRL, conjunctiva/corneas clear, EOM's intact       Lungs:     Clear to auscultation bilaterally with rare expiratory wheeze, respirations unlabored  Heart:    Regular rate and rhythm  Neurologic:   Awake, alert, oriented x 3. No apparent focal neurological           defect.          Assessment & Plan:     1. Cough  - HYDROcodone-homatropine (HYCODAN) 5-1.5 MG/5ML syrup; Take 5 mLs every 8 (eight) hours as needed by mouth for cough.  Dispense: 120 mL; Refill: 0  2. Bronchitis  - doxycycline (VIBRA-TABS) 100 MG tablet; Take 1 tablet (100 mg total) 2 (two) times daily for 10 days by mouth.  Dispense: 20 tablet; Refill: 0   Call if symptoms change or if not rapidly improving.     3. Need for shingles vaccine  - Varicella-zoster vaccine IM (Shingrix)       Lelon Huh, MD  [facility]

## 2017-09-30 NOTE — Patient Instructions (Signed)

## 2017-10-02 ENCOUNTER — Other Ambulatory Visit: Payer: Self-pay | Admitting: Family Medicine

## 2017-10-02 DIAGNOSIS — M1711 Unilateral primary osteoarthritis, right knee: Secondary | ICD-10-CM

## 2017-10-02 MED ORDER — HYDROCODONE-ACETAMINOPHEN 7.5-325 MG PO TABS: 1.0000 | ORAL_TABLET | Freq: Four times a day (QID) | ORAL | 0 refills | Status: DC | PRN

## 2017-10-06 ENCOUNTER — Other Ambulatory Visit: Payer: Self-pay | Admitting: Gastroenterology

## 2017-10-16 ENCOUNTER — Other Ambulatory Visit: Payer: Self-pay | Admitting: Gastroenterology

## 2017-10-20 ENCOUNTER — Other Ambulatory Visit: Payer: Self-pay | Admitting: Family Medicine

## 2017-10-22 ENCOUNTER — Ambulatory Visit: Payer: BLUE CROSS/BLUE SHIELD | Admitting: Family Medicine

## 2017-10-22 ENCOUNTER — Other Ambulatory Visit: Payer: Self-pay | Admitting: Family Medicine

## 2017-10-22 ENCOUNTER — Encounter: Payer: Self-pay | Admitting: Family Medicine

## 2017-10-22 VITALS — BP 100/58 | HR 89 | Temp 98.3°F | Resp 16 | Wt 191.0 lb

## 2017-10-22 DIAGNOSIS — J40 Bronchitis, not specified as acute or chronic: Secondary | ICD-10-CM

## 2017-10-22 DIAGNOSIS — R059 Cough, unspecified: Secondary | ICD-10-CM

## 2017-10-22 DIAGNOSIS — R05 Cough: Secondary | ICD-10-CM | POA: Diagnosis not present

## 2017-10-22 MED ORDER — LEVOFLOXACIN 500 MG PO TABS: 500.0000 mg | ORAL_TABLET | Freq: Every day | ORAL | 0 refills | Status: DC

## 2017-10-22 MED ORDER — PREDNISONE 10 MG PO TABS: ORAL_TABLET | ORAL | 0 refills | Status: AC

## 2017-10-22 MED ORDER — BUDESONIDE-FORMOTEROL FUMARATE 80-4.5 MCG/ACT IN AERO: 2.0000 | INHALATION_SPRAY | Freq: Two times a day (BID) | RESPIRATORY_TRACT | 0 refills | Status: DC

## 2017-10-22 MED ORDER — SERTRALINE HCL 100 MG PO TABS: 200.0000 mg | ORAL_TABLET | Freq: Every day | ORAL | 4 refills | Status: DC

## 2017-10-22 NOTE — Telephone Encounter (Signed)
[facility] faxed a refill request for a 90-days supply for the following medication. Thanks CC  sertraline (ZOLOFT) 100 MG tablet

## 2017-10-22 NOTE — Progress Notes (Signed)
Patient: Kimberly Hudson Female    DOB: [birth year]   61 y.o.   MRN: [id number] Visit Date: 10/22/2017  Today's Provider: Lelon Huh, MD   Chief Complaint  Patient presents with  . Cough   Subjective:    HPI Patient was seen in office 09/30/2017 for cough. Patient was prescribed doxycycline for bronchitis and hydrocodone syrup for cough. Patient states that her cough has not improved. Patient states she also has some sob and wheezing. States has had episodes of uncontrollable cough sometimes productive of clear and yellow mucous.    Allergies  Allergen Reactions  . Nsaids Other (See Comments)    Because of Crohns Because of Crohns  . Sulfa Antibiotics Rash     Current Outpatient Medications:  .  azaTHIOprine (IMURAN) 50 MG tablet, TAKE 3 TABLETS (150 MG TOTAL) BY MOUTH DAILY., Disp: 90 tablet, Rfl: 11 .  clonazePAM (KLONOPIN) 1 MG tablet, TAKE 1 TABLET BY MOUTH AT BEDTIME, Disp: 30 tablet, Rfl: 5 .  cyclobenzaprine (FLEXERIL) 5 MG tablet, Take 1 tablet (5 mg total) by mouth 3 (three) times daily as needed for muscle spasms., Disp: 90 tablet, Rfl: 5 .  dicyclomine (BENTYL) 20 MG tablet, Take 1 tablet (20 mg total) by mouth 3 (three) times daily before meals., Disp: 90 tablet, Rfl: 5 .  diphenoxylate-atropine (LOMOTIL) 2.5-0.025 MG tablet, Take 1 tablet by mouth 4 (four) times daily as needed for diarrhea or loose stools., Disp: 120 tablet, Rfl: 5 .  fluconazole (DIFLUCAN) 100 MG tablet, TAKE 1 TABLET BY MOUTH EVERY DAY, Disp: 14 tablet, Rfl: 2 .  HYDROcodone-acetaminophen (NORCO) 7.5-325 MG tablet, Take 1 tablet every 6 (six) hours as needed by mouth for moderate pain., Disp: 120 tablet, Rfl: 0 .  lisinopril-hydrochlorothiazide (PRINZIDE,ZESTORETIC) 10-12.5 MG tablet, 1/2 tablet daily, Disp: 3 tablet, Rfl: 0 .  pantoprazole (PROTONIX) 40 MG tablet, TAKE 1 TABLET BY MOUTH TWICE A DAY, Disp: 90 tablet, Rfl: 1 .  pravastatin (PRAVACHOL) 10 MG tablet, Take 1 tablet (10 mg  total) by mouth daily., Disp: 90 tablet, Rfl: 1 .  promethazine (PHENERGAN) 25 MG tablet, TAKE 1 TABLET BY MOUTH EVERY 6 HOURS AS NEEDED FOR NAUSEA AND VOMITING, Disp: 30 tablet, Rfl: 0 .  sertraline (ZOLOFT) 100 MG tablet, Take 2 tablets (200 mg total) by mouth daily., Disp: 60 tablet, Rfl: 4 .  HYDROcodone-homatropine (HYCODAN) 5-1.5 MG/5ML syrup, Take 5 mLs every 8 (eight) hours as needed by mouth for cough. (Patient not taking: Reported on 10/22/2017), Disp: 120 mL, Rfl: 0  Review of Systems  Constitutional: Negative for appetite change and fatigue.  HENT: Positive for congestion.   Respiratory: Positive for shortness of breath and wheezing. Negative for chest tightness.   Cardiovascular: Negative for palpitations.  Gastrointestinal: Negative for abdominal pain, nausea and vomiting.  Neurological: Negative for dizziness and weakness.    Social History   Tobacco Use  . Smoking status: Former Smoker    Packs/day: 1.00    Years: 10.00    Pack years: 10.00    Types: Cigarettes    Last attempt to quit: 11/18/2012    Years since quitting: 4.9  . Smokeless tobacco: Never Used  Substance Use Topics  . Alcohol use: Yes    Alcohol/week: 0.0 oz    Comment: every once in a while   Objective:   BP (!) 100/58 (BP Location: Right Arm, Patient Position: Sitting, Cuff Size: Large)   Pulse 89   Temp 98.3 F (36.8  C) (Oral)   Resp 16   Wt 191 lb (86.6 kg)   SpO2 98%   BMI 29.91 kg/m  Vitals:   10/22/17 1008  BP: (!) 100/58  Pulse: 89  Resp: 16  Temp: 98.3 F (36.8 C)  TempSrc: Oral  SpO2: 98%  Weight: 191 lb (86.6 kg)     Physical Exam  General Appearance:    Alert, cooperative, no distress  HENT:   ENT exam normal, no neck nodes or sinus tenderness  Eyes:    PERRL, conjunctiva/corneas clear, EOM's intact       Lungs:     Occasional expiratory wheezes, no rales, respirations unlabored  Heart:    Regular rate and rhythm  Neurologic:   Awake, alert, oriented x 3. No apparent  focal neurological           defect.          Assessment & Plan:     1. Bronchitis Failed doxycycline.  - levofloxacin (LEVAQUIN) 500 MG tablet; Take 1 tablet (500 mg total) by mouth daily.  Dispense: 7 tablet; Refill: 0 - predniSONE (DELTASONE) 10 MG tablet; 6 tablets for 1 day, then 5 for 1 day, then 4 for 1 day, then 3 for 1 day, then 2 for 1 day then 1 for 1 day.  Dispense: 21 tablet; Refill: 0  2. Cough  - budesonide-formoterol (SYMBICORT) 80-4.5 MCG/ACT inhaler; Inhale 2 puffs into the lungs 2 (two) times daily for 15 days.  Dispense: 1 Inhaler; Refill: 0  Call if symptoms change or if not rapidly improving.       The entirety of the information documented in the History of Present Illness, Review of Systems and Physical Exam were personally obtained by me. Portions of this information were initially documented by April M. Sabra Heck, CMA and reviewed by me for thoroughness and accuracy.    Lelon Huh, MD  [facility]

## 2017-10-29 ENCOUNTER — Other Ambulatory Visit: Payer: Self-pay | Admitting: Gastroenterology

## 2017-10-29 ENCOUNTER — Other Ambulatory Visit: Payer: Self-pay | Admitting: Family Medicine

## 2017-10-29 DIAGNOSIS — M1711 Unilateral primary osteoarthritis, right knee: Secondary | ICD-10-CM

## 2017-10-29 MED ORDER — PROMETHAZINE HCL 25 MG PO TABS: ORAL_TABLET | ORAL | 2 refills | Status: DC

## 2017-10-29 MED ORDER — HYDROCODONE-ACETAMINOPHEN 7.5-325 MG PO TABS: 1.0000 | ORAL_TABLET | Freq: Four times a day (QID) | ORAL | 0 refills | Status: DC | PRN

## 2017-10-29 NOTE — Telephone Encounter (Signed)
Please advise patient that hydrocodone/apap  prescription has been sent electronically to   [id number] [place] [address]

## 2017-10-29 NOTE — Telephone Encounter (Signed)
I have forwarded this message to [facility] with Dr Dorothey Baseman office. This was sent to wrong office.

## 2017-10-29 NOTE — Telephone Encounter (Signed)
Patient advised.

## 2017-10-29 NOTE — Telephone Encounter (Signed)
Last RF 10/02/17 and last OV 10/22/17

## 2017-11-06 ENCOUNTER — Other Ambulatory Visit: Payer: Self-pay

## 2017-11-27 ENCOUNTER — Other Ambulatory Visit: Payer: Self-pay | Admitting: Family Medicine

## 2017-11-27 DIAGNOSIS — M1711 Unilateral primary osteoarthritis, right knee: Secondary | ICD-10-CM

## 2017-11-27 MED ORDER — HYDROCODONE-ACETAMINOPHEN 7.5-325 MG PO TABS: 1.0000 | ORAL_TABLET | Freq: Four times a day (QID) | ORAL | 0 refills | Status: DC | PRN

## 2017-12-16 ENCOUNTER — Other Ambulatory Visit: Payer: Self-pay | Admitting: Gastroenterology

## 2017-12-16 DIAGNOSIS — K50919 Crohn's disease, unspecified, with unspecified complications: Secondary | ICD-10-CM

## 2017-12-24 ENCOUNTER — Other Ambulatory Visit: Payer: Self-pay

## 2017-12-24 DIAGNOSIS — M1711 Unilateral primary osteoarthritis, right knee: Secondary | ICD-10-CM

## 2017-12-24 MED ORDER — HYDROCODONE-ACETAMINOPHEN 7.5-325 MG PO TABS: 1.0000 | ORAL_TABLET | Freq: Four times a day (QID) | ORAL | 0 refills | Status: DC | PRN

## 2017-12-24 NOTE — Telephone Encounter (Signed)
Patient requesting refills. Pharmacy listed is correct. Thanks!

## 2017-12-29 ENCOUNTER — Other Ambulatory Visit: Payer: Self-pay | Admitting: Family Medicine

## 2018-01-06 ENCOUNTER — Other Ambulatory Visit: Payer: Self-pay | Admitting: Family Medicine

## 2018-01-06 DIAGNOSIS — S39012A Strain of muscle, fascia and tendon of lower back, initial encounter: Secondary | ICD-10-CM

## 2018-01-16 ENCOUNTER — Other Ambulatory Visit: Payer: Self-pay | Admitting: Family Medicine

## 2018-01-16 DIAGNOSIS — M1711 Unilateral primary osteoarthritis, right knee: Secondary | ICD-10-CM

## 2018-01-16 MED ORDER — HYDROCODONE-ACETAMINOPHEN 7.5-325 MG PO TABS: 1.0000 | ORAL_TABLET | Freq: Four times a day (QID) | ORAL | 0 refills | Status: DC | PRN

## 2018-01-31 ENCOUNTER — Other Ambulatory Visit: Payer: Self-pay | Admitting: Gastroenterology

## 2018-02-16 ENCOUNTER — Ambulatory Visit: Payer: BLUE CROSS/BLUE SHIELD | Admitting: Family Medicine

## 2018-02-16 ENCOUNTER — Encounter: Payer: Self-pay | Admitting: Family Medicine

## 2018-02-16 VITALS — BP 110/64 | HR 86 | Temp 98.2°F | Resp 16 | Wt 193.0 lb

## 2018-02-16 DIAGNOSIS — J019 Acute sinusitis, unspecified: Secondary | ICD-10-CM

## 2018-02-16 MED ORDER — AZITHROMYCIN 250 MG PO TABS: ORAL_TABLET | ORAL | 0 refills | Status: AC

## 2018-02-16 MED ORDER — FLUTICASONE PROPIONATE 50 MCG/ACT NA SUSP: 2.0000 | Freq: Every day | NASAL | 6 refills | Status: AC

## 2018-02-16 NOTE — Progress Notes (Signed)
Patient: Kimberly Hudson Female    DOB: 06/29/1956   62 y.o.   MRN: [id number] Visit Date: 02/16/2018  Today's Provider: Lelon Huh, MD   Chief Complaint  Patient presents with  . Cough   Subjective:    Patient has had cough and sinus pain for 3 days. Other symptoms include: nasal congestion, sore throat, headaches, chills and sweats. Also has some shortness of breath and wheezing. Patient has been taking otc allergy medication.   Cough  This is a new problem. The current episode started in the past 7 days (3 days). The problem has been gradually worsening. The problem occurs every few minutes. The cough is productive of sputum. Associated symptoms include chills, headaches, myalgias, nasal congestion, rhinorrhea, shortness of breath, sweats and wheezing. Pertinent negatives include no chest pain, ear congestion, ear pain, fever, heartburn, hemoptysis, postnasal drip, rash, sore throat or weight loss. The symptoms are aggravated by lying down and exercise. Treatments tried: allergy medication. The treatment provided mild relief. Her past medical history is significant for bronchitis and environmental allergies. There is no history of asthma, bronchiectasis, COPD, emphysema or pneumonia.  Tried loratadine without relief.      Allergies  Allergen Reactions  . Nsaids Other (See Comments)    Because of Crohns Because of Crohns  . Sulfa Antibiotics Rash     Current Outpatient Medications:  .  azaTHIOprine (IMURAN) 50 MG tablet, TAKE 3 TABLETS (150 MG TOTAL) BY MOUTH DAILY., Disp: 90 tablet, Rfl: 11 .  budesonide-formoterol (SYMBICORT) 80-4.5 MCG/ACT inhaler, Inhale 2 puffs into the lungs 2 (two) times daily for 15 days., Disp: 1 Inhaler, Rfl: 0 .  clonazePAM (KLONOPIN) 1 MG tablet, TAKE 1 TABLET BY MOUTH AT BEDTIME, Disp: 30 tablet, Rfl: 5 .  cyclobenzaprine (FLEXERIL) 5 MG tablet, TAKE 1 TABLET BY MOUTH THREE TIMES A DAY AS NEEDED FOR MUSCLE SPASMS, Disp: 90 tablet, Rfl: 5 .   dicyclomine (BENTYL) 20 MG tablet, TAKE 1 TABLET (20 MG TOTAL) BY MOUTH 3 (THREE) TIMES DAILY BEFORE MEALS., Disp: 90 tablet, Rfl: 5 .  diphenoxylate-atropine (LOMOTIL) 2.5-0.025 MG tablet, Take 1 tablet by mouth 4 (four) times daily as needed for diarrhea or loose stools., Disp: 120 tablet, Rfl: 5 .  fluconazole (DIFLUCAN) 100 MG tablet, TAKE 1 TABLET BY MOUTH EVERY DAY, Disp: 14 tablet, Rfl: 2 .  HYDROcodone-acetaminophen (NORCO) 7.5-325 MG tablet, Take 1 tablet by mouth every 6 (six) hours as needed for moderate pain., Disp: 120 tablet, Rfl: 0 .  levofloxacin (LEVAQUIN) 500 MG tablet, Take 1 tablet (500 mg total) by mouth daily., Disp: 7 tablet, Rfl: 0 .  lisinopril-hydrochlorothiazide (PRINZIDE,ZESTORETIC) 10-12.5 MG tablet, TAKE 1 TABLET BY MOUTH DAILY., Disp: 90 tablet, Rfl: 4 .  pantoprazole (PROTONIX) 40 MG tablet, TAKE 1 TABLET BY MOUTH TWICE A DAY, Disp: 90 tablet, Rfl: 1 .  pravastatin (PRAVACHOL) 10 MG tablet, Take 1 tablet (10 mg total) by mouth daily., Disp: 90 tablet, Rfl: 1 .  promethazine (PHENERGAN) 25 MG tablet, TAKE 1 TABLET BY MOUTH EVERY 6 HOURS AS NEEDED FOR NAUSEA AND VOMITING, Disp: 30 tablet, Rfl: 2 .  sertraline (ZOLOFT) 100 MG tablet, Take 2 tablets (200 mg total) by mouth daily., Disp: 180 tablet, Rfl: 4  Review of Systems  Constitutional: Positive for chills. Negative for appetite change, fatigue, fever and weight loss.  HENT: Positive for congestion, rhinorrhea, sinus pressure and sinus pain. Negative for ear pain, postnasal drip and sore throat.   Respiratory:  Positive for cough, shortness of breath and wheezing. Negative for hemoptysis and chest tightness.   Cardiovascular: Negative for chest pain and palpitations.  Gastrointestinal: Negative for abdominal pain, heartburn, nausea and vomiting.  Musculoskeletal: Positive for myalgias.  Skin: Negative for rash.  Allergic/Immunologic: Positive for environmental allergies.  Neurological: Positive for headaches.  Negative for dizziness and weakness.    Social History   Tobacco Use  . Smoking status: Former Smoker    Packs/day: 1.00    Years: 10.00    Pack years: 10.00    Types: Cigarettes    Last attempt to quit: 11/18/2012    Years since quitting: 5.2  . Smokeless tobacco: Never Used  Substance Use Topics  . Alcohol use: Yes    Alcohol/week: 0.0 oz    Comment: every once in a while   Objective:   BP 110/64 (BP Location: Right Arm, Patient Position: Sitting, Cuff Size: Large)   Pulse 86   Temp 98.2 F (36.8 C) (Oral)   Resp 16   Wt 193 lb (87.5 kg)   SpO2 93%   BMI 30.23 kg/m  Vitals:   02/16/18 1544  BP: 110/64  Pulse: 86  Resp: 16  Temp: 98.2 F (36.8 C)  TempSrc: Oral  SpO2: 93%  Weight: 193 lb (87.5 kg)     Physical Exam  General Appearance:    Alert, cooperative, no distress  HENT:   bilateral TM normal without fluid or infection, neck without nodes, pharynx erythematous without exudate, frontal and maxillary sinuses tender, post nasal drip noted and nasal mucosa pale and congested  Eyes:    PERRL, conjunctiva/corneas clear, EOM's intact       Lungs:     Clear to auscultation bilaterally, respirations unlabored  Heart:    Regular rate and rhythm  Neurologic:   Awake, alert, oriented x 3. No apparent focal neurological           defect.           Assessment & Plan:     1. Acute sinusitis, recurrence not specified, unspecified location Call if symptoms change or if not rapidly improving.     - azithromycin (ZITHROMAX) 250 MG tablet; 2 by mouth today, then 1 daily for 4 days  Dispense: 6 tablet; Refill: 0 - fluticasone (FLONASE) 50 MCG/ACT nasal spray; Place 2 sprays into both nostrils daily.  Dispense: 16 g; Refill: 6       Lelon Huh, MD  [facility]

## 2018-02-19 ENCOUNTER — Other Ambulatory Visit: Payer: Self-pay | Admitting: Family Medicine

## 2018-02-19 DIAGNOSIS — M1711 Unilateral primary osteoarthritis, right knee: Secondary | ICD-10-CM

## 2018-02-19 MED ORDER — HYDROCODONE-ACETAMINOPHEN 7.5-325 MG PO TABS: 1.0000 | ORAL_TABLET | Freq: Four times a day (QID) | ORAL | 0 refills | Status: DC | PRN

## 2018-02-19 NOTE — Telephone Encounter (Signed)
Patient is requesting a refill on the following medication  HYDROcodone-acetaminophen (NORCO) 7.5-325 MG tablet  She uses [place]

## 2018-03-12 ENCOUNTER — Ambulatory Visit: Payer: BLUE CROSS/BLUE SHIELD | Admitting: Physician Assistant

## 2018-03-12 ENCOUNTER — Encounter: Payer: Self-pay | Admitting: Physician Assistant

## 2018-03-12 VITALS — BP 112/70 | HR 84 | Temp 98.6°F | Resp 22 | Wt 189.0 lb

## 2018-03-12 DIAGNOSIS — J40 Bronchitis, not specified as acute or chronic: Secondary | ICD-10-CM | POA: Diagnosis not present

## 2018-03-12 DIAGNOSIS — R05 Cough: Secondary | ICD-10-CM | POA: Diagnosis not present

## 2018-03-12 DIAGNOSIS — R062 Wheezing: Secondary | ICD-10-CM

## 2018-03-12 DIAGNOSIS — R059 Cough, unspecified: Secondary | ICD-10-CM

## 2018-03-12 DIAGNOSIS — R0982 Postnasal drip: Secondary | ICD-10-CM | POA: Diagnosis not present

## 2018-03-12 MED ORDER — PREDNISONE 20 MG PO TABS: 40.0000 mg | ORAL_TABLET | Freq: Every day | ORAL | 0 refills | Status: AC

## 2018-03-12 MED ORDER — PROMETHAZINE-DM 6.25-15 MG/5ML PO SYRP: 5.0000 mL | ORAL_SOLUTION | Freq: Four times a day (QID) | ORAL | 0 refills | Status: DC | PRN

## 2018-03-12 NOTE — Progress Notes (Signed)
Patient: Kimberly Hudson Female    DOB: 1956-07-17   62 y.o.   MRN: [id number] Visit Date: 03/12/2018  Today's Provider: Trinna Post, PA-C   Chief Complaint  Patient presents with  . Cough    x 5 days   Subjective:    Kimberly Hudson is a 62 y/o woman with history of tobacco abuse presenting today for cough. She was seen by Dr. Caryn Section on 02/16/2018 for sinusitis and was prescribed azithrmycin. Pertinent info below.  Cough  This is a new problem. Episode onset: x 5 days. The problem has been gradually worsening. The cough is productive of sputum (yellow/ green colored). Associated symptoms include headaches, hemoptysis, nasal congestion, postnasal drip, rhinorrhea, shortness of breath, sweats and wheezing. Pertinent negatives include no chest pain, chills, ear congestion, ear pain, fever or sore throat. The symptoms are aggravated by lying down. Treatments tried: OTC Cough medication, Flonase and prescription inhalers. The treatment provided no relief.       Allergies  Allergen Reactions  . Nsaids Other (See Comments)    Because of Crohns Because of Crohns  . Sulfa Antibiotics Rash     Current Outpatient Medications:  .  azaTHIOprine (IMURAN) 50 MG tablet, TAKE 3 TABLETS (150 MG TOTAL) BY MOUTH DAILY., Disp: 90 tablet, Rfl: 11 .  clonazePAM (KLONOPIN) 1 MG tablet, TAKE 1 TABLET BY MOUTH AT BEDTIME, Disp: 30 tablet, Rfl: 5 .  cyclobenzaprine (FLEXERIL) 5 MG tablet, TAKE 1 TABLET BY MOUTH THREE TIMES A DAY AS NEEDED FOR MUSCLE SPASMS, Disp: 90 tablet, Rfl: 5 .  dicyclomine (BENTYL) 20 MG tablet, TAKE 1 TABLET (20 MG TOTAL) BY MOUTH 3 (THREE) TIMES DAILY BEFORE MEALS., Disp: 90 tablet, Rfl: 5 .  diphenoxylate-atropine (LOMOTIL) 2.5-0.025 MG tablet, Take 1 tablet by mouth 4 (four) times daily as needed for diarrhea or loose stools., Disp: 120 tablet, Rfl: 5 .  fluticasone (FLONASE) 50 MCG/ACT nasal spray, Place 2 sprays into both nostrils daily., Disp: 16 g, Rfl: 6 .   HYDROcodone-acetaminophen (NORCO) 7.5-325 MG tablet, Take 1 tablet by mouth every 6 (six) hours as needed for moderate pain., Disp: 120 tablet, Rfl: 0 .  lisinopril-hydrochlorothiazide (PRINZIDE,ZESTORETIC) 10-12.5 MG tablet, TAKE 1 TABLET BY MOUTH DAILY., Disp: 90 tablet, Rfl: 4 .  pantoprazole (PROTONIX) 40 MG tablet, TAKE 1 TABLET BY MOUTH TWICE A DAY, Disp: 90 tablet, Rfl: 1 .  pravastatin (PRAVACHOL) 10 MG tablet, Take 1 tablet (10 mg total) by mouth daily., Disp: 90 tablet, Rfl: 1 .  promethazine (PHENERGAN) 25 MG tablet, TAKE 1 TABLET BY MOUTH EVERY 6 HOURS AS NEEDED FOR NAUSEA AND VOMITING, Disp: 30 tablet, Rfl: 2 .  sertraline (ZOLOFT) 100 MG tablet, Take 2 tablets (200 mg total) by mouth daily., Disp: 180 tablet, Rfl: 4 .  budesonide-formoterol (SYMBICORT) 80-4.5 MCG/ACT inhaler, Inhale 2 puffs into the lungs 2 (two) times daily for 15 days., Disp: 1 Inhaler, Rfl: 0 .  fluconazole (DIFLUCAN) 100 MG tablet, TAKE 1 TABLET BY MOUTH EVERY DAY (Patient not taking: Reported on 03/12/2018), Disp: 14 tablet, Rfl: 2  Review of Systems  Constitutional: Negative for appetite change, chills, fatigue and fever.  HENT: Positive for postnasal drip and rhinorrhea. Negative for ear pain and sore throat.   Respiratory: Positive for cough, hemoptysis, shortness of breath and wheezing. Negative for chest tightness.   Cardiovascular: Negative for chest pain and palpitations.  Gastrointestinal: Negative for abdominal pain, nausea and vomiting.  Neurological: Positive for headaches. Negative  for dizziness and weakness.   Family History  Problem Relation Age of Onset  . COPD Mother   . Heart disease Mother   . COPD Father   . Emphysema Father   . Prostate cancer Father   . COPD Sister   . Heart disease Sister    Past Surgical History:  Procedure Laterality Date  . COLONOSCOPY  02/2013   No polyps were found  . ESOPHAGEAL DILATION  05/29/2017   Procedure: ESOPHAGEAL DILATION;  Surgeon: Lucilla Lame,  MD;  Location: [place];  Service: Endoscopy;;  . ESOPHAGOGASTRODUODENOSCOPY (EGD) WITH PROPOFOL N/A 06/28/2015   Procedure: ESOPHAGOGASTRODUODENOSCOPY (EGD) WITH PROPOFOL;  Surgeon: Lucilla Lame, MD;  Location: [place];  Service: Endoscopy;  Laterality: N/A;  with dialation  . ESOPHAGOGASTRODUODENOSCOPY (EGD) WITH PROPOFOL N/A 07/26/2015   Procedure: ESOPHAGOGASTRODUODENOSCOPY (EGD) WITH PROPOFOL, with dialation;  Surgeon: Lucilla Lame, MD;  Location: [place];  Service: Endoscopy;  Laterality: N/A;  LEAVE PT LAST  . ESOPHAGOGASTRODUODENOSCOPY (EGD) WITH PROPOFOL N/A 08/05/2016   Procedure: ESOPHAGOGASTRODUODENOSCOPY (EGD) WITH PROPOFOL;  Surgeon: Lucilla Lame, MD;  Location: [place];  Service: Endoscopy;  Laterality: N/A;  . ESOPHAGOGASTRODUODENOSCOPY (EGD) WITH PROPOFOL N/A 09/26/2016   Procedure: ESOPHAGOGASTRODUODENOSCOPY (EGD) WITH PROPOFOL;  Surgeon: Lucilla Lame, MD;  Location: [place];  Service: Endoscopy;  Laterality: N/A;  . ESOPHAGOGASTRODUODENOSCOPY (EGD) WITH PROPOFOL N/A 05/29/2017   Procedure: ESOPHAGOGASTRODUODENOSCOPY (EGD) WITH PROPOFOL - Injection of steroid in esophagus;  Surgeon: Lucilla Lame, MD;  Location: [place];  Service: Endoscopy;  Laterality: N/A;  . Excision BCC  07/28/2013   right nares, Georgiann Cocker  . TUBAL LIGATION  1986  . UPPER GI ENDOSCOPY  02/2014     Social History   Tobacco Use  . Smoking status: Former Smoker    Packs/day: 1.00    Years: 10.00    Pack years: 10.00    Types: Cigarettes    Last attempt to quit: 11/18/2012    Years since quitting: 5.3  . Smokeless tobacco: Never Used  Substance Use Topics  . Alcohol use: Yes    Alcohol/week: 0.0 oz    Comment: every once in a while   Objective:   BP 112/70 (BP Location: Left Arm, Patient Position: Sitting, Cuff Size: Large)   Pulse 84   Temp 98.6 F (37 C) (Oral)   Resp (!) 22 Comment: labored  Wt 189 lb (85.7 kg)   SpO2 97%  Comment: room air  BMI 29.60 kg/m  Vitals:   03/12/18 1003  BP: 112/70  Pulse: 84  Resp: (!) 22  Temp: 98.6 F (37 C)  TempSrc: Oral  SpO2: 97%  Weight: 189 lb (85.7 kg)     Physical Exam  Constitutional: She is oriented to person, place, and time.  Cardiovascular: Normal rate and regular rhythm.  Pulmonary/Chest: Effort normal. Tachypnea noted. She has wheezes.  Neurological: She is alert and oriented to person, place, and time.  Skin: Skin is warm and dry.  Psychiatric: She has a normal mood and affect. Her behavior is normal.        Assessment & Plan:     1. Bronchitis  Treat as bronchitis with steroids. She does not have productive cough. Also suspect some component of allergies and encourage daily use of allergy medication. She declines inhaler.  2. Wheezing  - predniSONE (DELTASONE) 20 MG tablet; Take 2 tablets (40 mg total) by mouth daily with breakfast for 5 days.  Dispense: 10 tablet; Refill: 0  3. PND (post-nasal drip)   4. Cough  - promethazine-dextromethorphan (PROMETHAZINE-DM) 6.25-15 MG/5ML syrup; Take 5 mLs by mouth 4 (four) times daily as needed for cough.  Dispense: 118 mL; Refill: 0  Return if symptoms worsen or fail to improve.  The entirety of the information documented in the History of Present Illness, Review of Systems and Physical Exam were personally obtained by me. Portions of this information were initially documented by Meyer Cory, CMA and reviewed by me for thoroughness and accuracy.         Trinna Post, PA-C  [facility]

## 2018-03-12 NOTE — Patient Instructions (Signed)
Allergies An allergy is when your body reacts to a substance in a way that is not normal. An allergic reaction can happen after you:  Eat something.  Breathe in something.  Touch something.  You can be allergic to:  Things that are only around during certain seasons, like molds and pollens.  Foods.  Drugs.  Insects.  Animal dander.  What are the signs or symptoms?  Puffiness (swelling). This may happen on the lips, face, tongue, mouth, or throat.  Sneezing.  Coughing.  Breathing loudly (wheezing).  Stuffy nose.  Tingling in the mouth.  A rash.  Itching.  Itchy, red, puffy areas of skin (hives).  Watery eyes.  Throwing up (vomiting).  Watery poop (diarrhea).  Dizziness.  Feeling faint or fainting.  Trouble breathing or swallowing.  A tight feeling in the chest.  A fast heartbeat. How is this diagnosed? Allergies can be diagnosed with:  A medical and family history.  Skin tests.  Blood tests.  A food diary. A food diary is a record of all the foods, drinks, and symptoms you have each day.  The results of an elimination diet. This diet involves making sure not to eat certain foods and then seeing what happens when you start eating them again.  How is this treated? There is no cure for allergies, but allergic reactions can be treated with medicine. Severe reactions usually need to be treated at a hospital. How is this prevented? The best way to prevent an allergic reaction is to avoid the thing you are allergic to. Allergy shots and medicines can also help prevent reactions in some cases. This information is not intended to replace advice given to you by your health care provider. Make sure you discuss any questions you have with your health care provider. Document Released: 03/01/2013 Document Revised: 07/01/2016 Document Reviewed: 08/16/2014 Elsevier Interactive Patient Education  2018 Elsevier Inc.  

## 2018-03-16 ENCOUNTER — Ambulatory Visit (INDEPENDENT_AMBULATORY_CARE_PROVIDER_SITE_OTHER): Payer: BLUE CROSS/BLUE SHIELD | Admitting: Family Medicine

## 2018-03-16 ENCOUNTER — Telehealth: Payer: Self-pay | Admitting: Family Medicine

## 2018-03-16 ENCOUNTER — Encounter: Payer: Self-pay | Admitting: Family Medicine

## 2018-03-16 VITALS — BP 118/70 | HR 82 | Temp 98.8°F | Resp 20 | Wt 193.0 lb

## 2018-03-16 DIAGNOSIS — M545 Low back pain, unspecified: Secondary | ICD-10-CM

## 2018-03-16 DIAGNOSIS — R05 Cough: Secondary | ICD-10-CM | POA: Diagnosis not present

## 2018-03-16 DIAGNOSIS — R059 Cough, unspecified: Secondary | ICD-10-CM

## 2018-03-16 DIAGNOSIS — F329 Major depressive disorder, single episode, unspecified: Secondary | ICD-10-CM

## 2018-03-16 DIAGNOSIS — J019 Acute sinusitis, unspecified: Secondary | ICD-10-CM

## 2018-03-16 DIAGNOSIS — F32A Depression, unspecified: Secondary | ICD-10-CM

## 2018-03-16 MED ORDER — AZITHROMYCIN 250 MG PO TABS: ORAL_TABLET | ORAL | 0 refills | Status: AC

## 2018-03-16 MED ORDER — FLUOXETINE HCL 40 MG PO CAPS: 40.0000 mg | ORAL_CAPSULE | Freq: Every day | ORAL | 3 refills | Status: DC

## 2018-03-16 MED ORDER — HYDROCODONE-HOMATROPINE 5-1.5 MG/5ML PO SYRP: 5.0000 mL | ORAL_SOLUTION | Freq: Three times a day (TID) | ORAL | 0 refills | Status: DC | PRN

## 2018-03-16 NOTE — Telephone Encounter (Signed)
Pharmacy calling to verify ok for prescription Hycodan and she is already taking Norco.  Please advise  Pharmacy [phone]  Thanks [employer]

## 2018-03-16 NOTE — Progress Notes (Signed)
Patient: Kimberly Hudson Female    DOB: 08/12/1956   62 y.o.   MRN: [id number] Visit Date: 03/16/2018  Today's Provider: Lelon Huh, MD   Chief Complaint  Patient presents with  . Cough   Subjective:    Cough  This is a new problem. Episode onset: 9 days ago. Patient was seen in the office by Carles Collet, PA 4 days ago for Bronchitis and was treated with Prednisone and prescription cough syrup. The problem has been unchanged. The cough is productive of sputum (yellow colored sputum). Associated symptoms include shortness of breath and wheezing. Pertinent negatives include no chest pain, chills or fever. Treatments tried: prescripton cough syrup and  flonase nasal spray  The treatment provided no relief.   She also states her mood has been more depressed the last several months and does not think sertraline is working well. Has had more stress helping care for autistic child. She states she took Prozac in the past and thought it worked well, she requests change to Prozac.      Allergies  Allergen Reactions  . Nsaids Other (See Comments)    Because of Crohns Because of Crohns  . Sulfa Antibiotics Rash     Current Outpatient Medications:  .  azaTHIOprine (IMURAN) 50 MG tablet, TAKE 3 TABLETS (150 MG TOTAL) BY MOUTH DAILY., Disp: 90 tablet, Rfl: 11 .  clonazePAM (KLONOPIN) 1 MG tablet, TAKE 1 TABLET BY MOUTH AT BEDTIME, Disp: 30 tablet, Rfl: 5 .  cyclobenzaprine (FLEXERIL) 5 MG tablet, TAKE 1 TABLET BY MOUTH THREE TIMES A DAY AS NEEDED FOR MUSCLE SPASMS, Disp: 90 tablet, Rfl: 5 .  dicyclomine (BENTYL) 20 MG tablet, TAKE 1 TABLET (20 MG TOTAL) BY MOUTH 3 (THREE) TIMES DAILY BEFORE MEALS., Disp: 90 tablet, Rfl: 5 .  diphenoxylate-atropine (LOMOTIL) 2.5-0.025 MG tablet, Take 1 tablet by mouth 4 (four) times daily as needed for diarrhea or loose stools., Disp: 120 tablet, Rfl: 5 .  fluticasone (FLONASE) 50 MCG/ACT nasal spray, Place 2 sprays into both nostrils daily., Disp: 16  g, Rfl: 6 .  HYDROcodone-acetaminophen (NORCO) 7.5-325 MG tablet, Take 1 tablet by mouth every 6 (six) hours as needed for moderate pain., Disp: 120 tablet, Rfl: 0 .  lisinopril-hydrochlorothiazide (PRINZIDE,ZESTORETIC) 10-12.5 MG tablet, TAKE 1 TABLET BY MOUTH DAILY., Disp: 90 tablet, Rfl: 4 .  pantoprazole (PROTONIX) 40 MG tablet, TAKE 1 TABLET BY MOUTH TWICE A DAY, Disp: 90 tablet, Rfl: 1 .  pravastatin (PRAVACHOL) 10 MG tablet, Take 1 tablet (10 mg total) by mouth daily., Disp: 90 tablet, Rfl: 1 .  predniSONE (DELTASONE) 20 MG tablet, Take 2 tablets (40 mg total) by mouth daily with breakfast for 5 days., Disp: 10 tablet, Rfl: 0 .  promethazine (PHENERGAN) 25 MG tablet, TAKE 1 TABLET BY MOUTH EVERY 6 HOURS AS NEEDED FOR NAUSEA AND VOMITING, Disp: 30 tablet, Rfl: 2 .  promethazine-dextromethorphan (PROMETHAZINE-DM) 6.25-15 MG/5ML syrup, Take 5 mLs by mouth 4 (four) times daily as needed for cough., Disp: 118 mL, Rfl: 0 .  sertraline (ZOLOFT) 100 MG tablet, Take 2 tablets (200 mg total) by mouth daily., Disp: 180 tablet, Rfl: 4 .  budesonide-formoterol (SYMBICORT) 80-4.5 MCG/ACT inhaler, Inhale 2 puffs into the lungs 2 (two) times daily for 15 days., Disp: 1 Inhaler, Rfl: 0 .  fluconazole (DIFLUCAN) 100 MG tablet, TAKE 1 TABLET BY MOUTH EVERY DAY (Patient not taking: Reported on 03/16/2018), Disp: 14 tablet, Rfl: 2  Review of Systems  Constitutional: Negative  for appetite change, chills, fatigue and fever.  Respiratory: Positive for cough, shortness of breath and wheezing. Negative for chest tightness.   Cardiovascular: Negative for chest pain and palpitations.  Gastrointestinal: Negative for abdominal pain, nausea and vomiting.  Neurological: Negative for dizziness and weakness.  Psychiatric/Behavioral: Positive for dysphoric mood.       Frequent crying spells    Social History   Tobacco Use  . Smoking status: Former Smoker    Packs/day: 1.00    Years: 10.00    Pack years: 10.00     Types: Cigarettes    Last attempt to quit: 11/18/2012    Years since quitting: 5.3  . Smokeless tobacco: Never Used  Substance Use Topics  . Alcohol use: Yes    Alcohol/week: 0.0 oz    Comment: every once in a while   Objective:   BP 118/70 (BP Location: Left Arm, Patient Position: Sitting, Cuff Size: Normal)   Pulse 82   Temp 98.8 F (37.1 C) (Oral)   Resp 20   Wt 193 lb (87.5 kg)   SpO2 95% Comment: room air  BMI 30.23 kg/m  There were no vitals filed for this visit.   Physical Exam  General Appearance:    Alert, cooperative, no distress  HENT:   bilateral TM normal without fluid or infection, neck without nodes, neck has bilateral anterior cervical nodes enlarged, frontal and maxillary sinus tender and nasal mucosa pale and congested  Eyes:    PERRL, conjunctiva/corneas clear, EOM's intact       Lungs:     Occasional expiratory wheeze, no rales,  respirations unlabored  Heart:    Regular rate and rhythm  Neurologic:   Awake, alert, oriented x 3. No apparent focal neurological           defect.           Assessment & Plan:     1. Cough  - HYDROcodone-homatropine (HYCODAN) 5-1.5 MG/5ML syrup; Take 5 mLs by mouth every 8 (eight) hours as needed.  Dispense: 100 mL; Refill: 0  2. Acute sinusitis, recurrence not specified, unspecified location Getting progressive worse with time and not improving on oral steroids.  - azithromycin (ZITHROMAX) 250 MG tablet; 2 by mouth today, then 1 daily for 4 days  Dispense: 6 tablet; Refill: 0  3. Acute midline low back pain without sciatica  - azithromycin (ZITHROMAX) 250 MG tablet; 2 by mouth today, then 1 daily for 4 days  Dispense: 6 tablet; Refill: 0  4. Depression, unspecified depression type No longer controlled on sertraline, change to - FLUoxetine (PROZAC) 40 MG capsule; Take 1 capsule (40 mg total) by mouth daily.  Dispense: 30 capsule; Refill: 3  Call if symptoms change or if not rapidly improving.           Lelon Huh, MD  [facility]

## 2018-03-16 NOTE — Telephone Encounter (Signed)
Pharmacy was advised.

## 2018-03-16 NOTE — Telephone Encounter (Signed)
Yes, they can fill the hydrocodone/homatropine.

## 2018-03-19 ENCOUNTER — Other Ambulatory Visit: Payer: Self-pay | Admitting: Family Medicine

## 2018-03-19 DIAGNOSIS — M1711 Unilateral primary osteoarthritis, right knee: Secondary | ICD-10-CM

## 2018-03-19 NOTE — Telephone Encounter (Signed)
Pt needs a refill on her Norco 7.5-325  [facility]

## 2018-03-20 MED ORDER — HYDROCODONE-ACETAMINOPHEN 7.5-325 MG PO TABS: 1.0000 | ORAL_TABLET | Freq: Four times a day (QID) | ORAL | 0 refills | Status: DC | PRN

## 2018-03-20 NOTE — Telephone Encounter (Signed)
Pt called back wanting to know if her rx has been sent to the pharmacy.  Thanks [employer]

## 2018-03-20 NOTE — Telephone Encounter (Signed)
Please review. Thanks!  

## 2018-03-30 ENCOUNTER — Other Ambulatory Visit: Payer: Self-pay

## 2018-03-30 DIAGNOSIS — R05 Cough: Secondary | ICD-10-CM

## 2018-03-30 DIAGNOSIS — R059 Cough, unspecified: Secondary | ICD-10-CM

## 2018-03-30 MED ORDER — BUDESONIDE-FORMOTEROL FUMARATE 80-4.5 MCG/ACT IN AERO: 2.0000 | INHALATION_SPRAY | Freq: Two times a day (BID) | RESPIRATORY_TRACT | 0 refills | Status: DC

## 2018-03-30 NOTE — Telephone Encounter (Signed)
Patient called wanting Symbicort sent into the pharmacy. She reports that she was given a sample and it helped with her breathing. She is out and wanted it sent into the pharmacy or another sample. Thanks!

## 2018-03-31 ENCOUNTER — Other Ambulatory Visit: Payer: Self-pay | Admitting: Family Medicine

## 2018-04-15 ENCOUNTER — Other Ambulatory Visit: Payer: Self-pay | Admitting: Gastroenterology

## 2018-04-20 ENCOUNTER — Telehealth: Payer: Self-pay | Admitting: Family Medicine

## 2018-04-20 DIAGNOSIS — M1711 Unilateral primary osteoarthritis, right knee: Secondary | ICD-10-CM

## 2018-04-20 MED ORDER — HYDROCODONE-ACETAMINOPHEN 7.5-325 MG PO TABS: 1.0000 | ORAL_TABLET | Freq: Four times a day (QID) | ORAL | 0 refills | Status: DC | PRN

## 2018-04-20 NOTE — Telephone Encounter (Signed)
Pt needs refill  Hydrocodone 7.5  [facility]

## 2018-04-27 ENCOUNTER — Other Ambulatory Visit: Payer: Self-pay | Admitting: Family Medicine

## 2018-04-27 DIAGNOSIS — R05 Cough: Secondary | ICD-10-CM

## 2018-04-27 DIAGNOSIS — R059 Cough, unspecified: Secondary | ICD-10-CM

## 2018-04-29 ENCOUNTER — Ambulatory Visit (INDEPENDENT_AMBULATORY_CARE_PROVIDER_SITE_OTHER): Payer: BLUE CROSS/BLUE SHIELD | Admitting: Family Medicine

## 2018-04-29 ENCOUNTER — Encounter: Payer: Self-pay | Admitting: Family Medicine

## 2018-04-29 VITALS — BP 118/76 | HR 73 | Temp 98.5°F | Resp 16 | Wt 192.0 lb

## 2018-04-29 DIAGNOSIS — F329 Major depressive disorder, single episode, unspecified: Secondary | ICD-10-CM | POA: Diagnosis not present

## 2018-04-29 DIAGNOSIS — F419 Anxiety disorder, unspecified: Secondary | ICD-10-CM | POA: Insufficient documentation

## 2018-04-29 DIAGNOSIS — F32A Depression, unspecified: Secondary | ICD-10-CM

## 2018-04-29 DIAGNOSIS — R05 Cough: Secondary | ICD-10-CM

## 2018-04-29 DIAGNOSIS — R942 Abnormal results of pulmonary function studies: Secondary | ICD-10-CM | POA: Insufficient documentation

## 2018-04-29 DIAGNOSIS — R059 Cough, unspecified: Secondary | ICD-10-CM

## 2018-04-29 MED ORDER — MONTELUKAST SODIUM 10 MG PO TABS: 10.0000 mg | ORAL_TABLET | Freq: Every day | ORAL | 3 refills | Status: DC

## 2018-04-29 MED ORDER — FLUOXETINE HCL 40 MG PO CAPS: 40.0000 mg | ORAL_CAPSULE | Freq: Two times a day (BID) | ORAL | 3 refills | Status: DC

## 2018-04-29 NOTE — Progress Notes (Addendum)
Patient: Kimberly Hudson Female    DOB: 26-Jun-1956   62 y.o.   MRN: [id number] Visit Date: 04/29/2018  Today's Provider: Lelon Huh, MD   Chief Complaint  Patient presents with  . Depression   Subjective:    HPI Depression: Patient comes in requesting that we increase the dosage for her Prozac and Clonazepam. She feels that the current dose she takes is not helping top control symptoms. Patient was last seen for this problem on 03/16/2018. Changes made during that visit includes changing Sertraline to Fluoxetine 69m daily.   Cough first thing in the morning and when outside for prolonged periods of time. She does have a smoking history , about 10 pack-years, and a family history of COPD that she is concerned about. She has no asthma history, but does have GERD which she states is well controlled on  Pantoprazole which she takes consistently. .     Allergies  Allergen Reactions  . Nsaids Other (See Comments)    Because of Crohns Because of Crohns  . Sulfa Antibiotics Rash     Current Outpatient Medications:  .  azaTHIOprine (IMURAN) 50 MG tablet, TAKE 3 TABLETS (150 MG TOTAL) BY MOUTH DAILY., Disp: 90 tablet, Rfl: 11 .  clonazePAM (KLONOPIN) 1 MG tablet, TAKE 1 TABLET BY MOUTH AT BEDTIME, Disp: 30 tablet, Rfl: 5 .  cyclobenzaprine (FLEXERIL) 5 MG tablet, TAKE 1 TABLET BY MOUTH THREE TIMES A DAY AS NEEDED FOR MUSCLE SPASMS, Disp: 90 tablet, Rfl: 5 .  dicyclomine (BENTYL) 20 MG tablet, TAKE 1 TABLET (20 MG TOTAL) BY MOUTH 3 (THREE) TIMES DAILY BEFORE MEALS., Disp: 90 tablet, Rfl: 5 .  diphenoxylate-atropine (LOMOTIL) 2.5-0.025 MG tablet, Take 1 tablet by mouth 4 (four) times daily as needed for diarrhea or loose stools., Disp: 120 tablet, Rfl: 5 .  fluconazole (DIFLUCAN) 100 MG tablet, TAKE 1 TABLET BY MOUTH EVERY DAY, Disp: 14 tablet, Rfl: 2 .  FLUoxetine (PROZAC) 40 MG capsule, Take 1 capsule (40 mg total) by mouth daily., Disp: 30 capsule, Rfl: 3 .  fluticasone  (FLONASE) 50 MCG/ACT nasal spray, Place 2 sprays into both nostrils daily., Disp: 16 g, Rfl: 6 .  HYDROcodone-acetaminophen (NORCO) 7.5-325 MG tablet, Take 1 tablet by mouth every 6 (six) hours as needed for moderate pain., Disp: 120 tablet, Rfl: 0 .  lisinopril-hydrochlorothiazide (PRINZIDE,ZESTORETIC) 10-12.5 MG tablet, TAKE 1 TABLET BY MOUTH DAILY., Disp: 90 tablet, Rfl: 4 .  pantoprazole (PROTONIX) 40 MG tablet, TAKE 1 TABLET BY MOUTH TWICE A DAY, Disp: 90 tablet, Rfl: 1 .  pravastatin (PRAVACHOL) 10 MG tablet, Take 1 tablet (10 mg total) by mouth daily., Disp: 90 tablet, Rfl: 1 .  promethazine (PHENERGAN) 25 MG tablet, TAKE 1 TABLET BY MOUTH EVERY 6 HOURS AS NEEDED FOR NAUSEA AND VOMITING, Disp: 30 tablet, Rfl: 2 .  promethazine-dextromethorphan (PROMETHAZINE-DM) 6.25-15 MG/5ML syrup, Take 5 mLs by mouth 4 (four) times daily as needed for cough., Disp: 118 mL, Rfl: 0 .  SYMBICORT 80-4.5 MCG/ACT inhaler, TAKE 2 PUFFS BY MOUTH TWICE A DAY, Disp: 10.2 Inhaler, Rfl: 11  Review of Systems  Constitutional: Negative for appetite change, chills, fatigue and fever.  Respiratory: Positive for cough. Negative for chest tightness and shortness of breath.   Cardiovascular: Negative for chest pain and palpitations.  Gastrointestinal: Negative for abdominal pain, nausea and vomiting.  Neurological: Negative for dizziness and weakness.  Psychiatric/Behavioral: Positive for dysphoric mood.    Social History   Tobacco Use  .  Smoking status: Former Smoker    Packs/day: 1.00    Years: 10.00    Pack years: 10.00    Types: Cigarettes    Last attempt to quit: 11/18/2012    Years since quitting: 5.4  . Smokeless tobacco: Never Used  Substance Use Topics  . Alcohol use: Yes    Alcohol/week: 0.0 oz    Comment: every once in a while   Objective:   BP 118/76 (BP Location: Left Arm, Patient Position: Sitting, Cuff Size: Large)   Pulse 73   Temp 98.5 F (36.9 C) (Oral)   Resp 16   Wt 192 lb (87.1 kg)    SpO2 95% Comment: room air  BMI 30.07 kg/m  Vitals:   04/29/18 1441  BP: 118/76  Pulse: 73  Resp: 16  Temp: 98.5 F (36.9 C)  TempSrc: Oral  SpO2: 95%  Weight: 192 lb (87.1 kg)     Physical Exam   General Appearance:    Alert, cooperative, no distress  Eyes:    PERRL, conjunctiva/corneas clear, EOM's intact       Lungs:     Clear to auscultation bilaterally, respirations unlabored  Heart:    Regular rate and rhythm  Neurologic:   Awake, alert, oriented x 3. No apparent focal neurological           defect.       Spirometry - moderate restriction, no matches    Assessment & Plan:     1. Depression, unspecified depression type Increase- FLUoxetine (PROZAC) 40 MG capsule; Take 1 capsule (40 mg total) by mouth 2 (two) times daily.  Dispense: 60 capsule; Refill: 3  2. Anxiety  - FLUoxetine (PROZAC) 40 MG capsule; Take 1 capsule (40 mg total) by mouth 2 (two) times daily.  Dispense: 60 capsule; Refill: 3  3. Cough Upper airway cough syndrome versus restrictive lung disease. Will try montelukast. Already on PPI for GERD - Spirometry with Graph - montelukast (SINGULAIR) 10 MG tablet; Take 1 tablet (10 mg total) by mouth at bedtime.  Dispense: 30 tablet; Refill: 3  4. Abnormal lung function test Unclear if this is due to poor effort or underlying restrictive lung disease. See how montelukast works.  - montelukast (SINGULAIR) 10 MG tablet; Take 1 tablet (10 mg total) by mouth at bedtime.  Dispense: 30 tablet; Refill: 3       Lelon Huh, MD  [facility]

## 2018-05-05 ENCOUNTER — Ambulatory Visit: Payer: Disability Insurance | Admitting: Gastroenterology

## 2018-05-10 ENCOUNTER — Other Ambulatory Visit: Payer: Self-pay | Admitting: Gastroenterology

## 2018-05-17 ENCOUNTER — Other Ambulatory Visit: Payer: Self-pay | Admitting: Family Medicine

## 2018-05-17 DIAGNOSIS — M1711 Unilateral primary osteoarthritis, right knee: Secondary | ICD-10-CM

## 2018-05-18 ENCOUNTER — Other Ambulatory Visit: Payer: Self-pay | Admitting: Family Medicine

## 2018-05-18 DIAGNOSIS — M1711 Unilateral primary osteoarthritis, right knee: Secondary | ICD-10-CM

## 2018-05-18 MED ORDER — HYDROCODONE-ACETAMINOPHEN 7.5-325 MG PO TABS: 1.0000 | ORAL_TABLET | Freq: Four times a day (QID) | ORAL | 0 refills | Status: DC | PRN

## 2018-05-21 ENCOUNTER — Other Ambulatory Visit: Payer: Self-pay | Admitting: Family Medicine

## 2018-05-21 DIAGNOSIS — F329 Major depressive disorder, single episode, unspecified: Secondary | ICD-10-CM

## 2018-05-21 DIAGNOSIS — F419 Anxiety disorder, unspecified: Secondary | ICD-10-CM

## 2018-05-21 DIAGNOSIS — F32A Depression, unspecified: Secondary | ICD-10-CM

## 2018-06-15 ENCOUNTER — Other Ambulatory Visit: Payer: Self-pay | Admitting: Family Medicine

## 2018-06-15 ENCOUNTER — Ambulatory Visit: Payer: Disability Insurance | Admitting: Gastroenterology

## 2018-06-15 DIAGNOSIS — M1711 Unilateral primary osteoarthritis, right knee: Secondary | ICD-10-CM

## 2018-06-15 MED ORDER — HYDROCODONE-ACETAMINOPHEN 7.5-325 MG PO TABS: 1.0000 | ORAL_TABLET | Freq: Four times a day (QID) | ORAL | 0 refills | Status: DC | PRN

## 2018-06-16 DIAGNOSIS — L570 Actinic keratosis: Secondary | ICD-10-CM | POA: Diagnosis not present

## 2018-06-16 DIAGNOSIS — L118 Other specified acantholytic disorders: Secondary | ICD-10-CM | POA: Diagnosis not present

## 2018-06-16 DIAGNOSIS — C44519 Basal cell carcinoma of skin of other part of trunk: Secondary | ICD-10-CM | POA: Diagnosis not present

## 2018-06-17 ENCOUNTER — Other Ambulatory Visit: Payer: Self-pay | Admitting: Gastroenterology

## 2018-06-17 ENCOUNTER — Other Ambulatory Visit: Payer: Self-pay | Admitting: Family Medicine

## 2018-06-17 DIAGNOSIS — S39012A Strain of muscle, fascia and tendon of lower back, initial encounter: Secondary | ICD-10-CM

## 2018-06-29 DIAGNOSIS — C44511 Basal cell carcinoma of skin of breast: Secondary | ICD-10-CM | POA: Diagnosis not present

## 2018-06-29 DIAGNOSIS — C44519 Basal cell carcinoma of skin of other part of trunk: Secondary | ICD-10-CM | POA: Diagnosis not present

## 2018-06-30 ENCOUNTER — Other Ambulatory Visit: Payer: Self-pay | Admitting: Gastroenterology

## 2018-06-30 DIAGNOSIS — K50919 Crohn's disease, unspecified, with unspecified complications: Secondary | ICD-10-CM

## 2018-07-09 ENCOUNTER — Other Ambulatory Visit: Payer: Self-pay

## 2018-07-09 DIAGNOSIS — K50919 Crohn's disease, unspecified, with unspecified complications: Secondary | ICD-10-CM

## 2018-07-09 MED ORDER — AZATHIOPRINE 50 MG PO TABS: 150.0000 mg | ORAL_TABLET | Freq: Every day | ORAL | 3 refills | Status: DC

## 2018-07-12 ENCOUNTER — Other Ambulatory Visit: Payer: Self-pay | Admitting: Family Medicine

## 2018-07-12 DIAGNOSIS — M1711 Unilateral primary osteoarthritis, right knee: Secondary | ICD-10-CM

## 2018-07-13 DIAGNOSIS — C44612 Basal cell carcinoma of skin of right upper limb, including shoulder: Secondary | ICD-10-CM | POA: Diagnosis not present

## 2018-07-13 DIAGNOSIS — C44519 Basal cell carcinoma of skin of other part of trunk: Secondary | ICD-10-CM | POA: Diagnosis not present

## 2018-07-13 MED ORDER — HYDROCODONE-ACETAMINOPHEN 7.5-325 MG PO TABS: 1.0000 | ORAL_TABLET | Freq: Four times a day (QID) | ORAL | 0 refills | Status: DC | PRN

## 2018-07-23 DIAGNOSIS — G4700 Insomnia, unspecified: Secondary | ICD-10-CM | POA: Diagnosis not present

## 2018-07-23 DIAGNOSIS — J449 Chronic obstructive pulmonary disease, unspecified: Secondary | ICD-10-CM | POA: Diagnosis not present

## 2018-07-23 DIAGNOSIS — G2581 Restless legs syndrome: Secondary | ICD-10-CM | POA: Diagnosis not present

## 2018-07-23 DIAGNOSIS — E669 Obesity, unspecified: Secondary | ICD-10-CM | POA: Diagnosis not present

## 2018-07-23 DIAGNOSIS — E785 Hyperlipidemia, unspecified: Secondary | ICD-10-CM | POA: Diagnosis not present

## 2018-07-23 DIAGNOSIS — K5090 Crohn's disease, unspecified, without complications: Secondary | ICD-10-CM | POA: Diagnosis not present

## 2018-07-23 DIAGNOSIS — R69 Illness, unspecified: Secondary | ICD-10-CM | POA: Diagnosis not present

## 2018-07-23 DIAGNOSIS — B379 Candidiasis, unspecified: Secondary | ICD-10-CM | POA: Diagnosis not present

## 2018-07-30 ENCOUNTER — Other Ambulatory Visit: Payer: Self-pay | Admitting: Gastroenterology

## 2018-07-30 ENCOUNTER — Ambulatory Visit (INDEPENDENT_AMBULATORY_CARE_PROVIDER_SITE_OTHER): Payer: Medicare HMO | Admitting: Gastroenterology

## 2018-07-30 ENCOUNTER — Encounter: Payer: Self-pay | Admitting: Gastroenterology

## 2018-07-30 VITALS — BP 120/62 | HR 83 | Ht 67.0 in | Wt 188.0 lb

## 2018-07-30 DIAGNOSIS — K50919 Crohn's disease, unspecified, with unspecified complications: Secondary | ICD-10-CM

## 2018-07-30 NOTE — Progress Notes (Signed)
Primary Care Physician: Kimberly Sons, MD  Primary Gastroenterologist:  Dr. Lucilla Hudson  Chief Complaint  Patient presents with  . Follow-up    HPI: Kimberly Hudson is a 62 y.o. female here with a history of Crohn's disease and dysphasia.  The patient had her esophagus stretched with injection of Kenalog back in July of last year.  The patient reports that she has been doing well but has not been seen for over a year so she want to follow-up.  The patient has also not had any lab work done since September of last year.  She denies any black stools or bloody stools but always reports that she has diarrhea.  There is no report of any unexplained weight loss and she is not having any abdominal pain.  Current Outpatient Medications  Medication Sig Dispense Refill  . azaTHIOprine (IMURAN) 50 MG tablet Take 3 tablets (150 mg total) by mouth daily. **PLEASE SCHEDULE FOLLOW UP APPT** 90 tablet 3  . clonazePAM (KLONOPIN) 1 MG tablet TAKE 1 TABLET BY MOUTH AT BEDTIME 30 tablet 5  . cyclobenzaprine (FLEXERIL) 5 MG tablet TAKE 1 TABLET BY MOUTH THREE TIMES A DAY AS NEEDED FOR MUSCLE SPASMS 90 tablet 5  . dicyclomine (BENTYL) 20 MG tablet TAKE 1 TABLET (20 MG TOTAL) BY MOUTH 3 (THREE) TIMES DAILY BEFORE MEALS. 90 tablet 5  . diphenoxylate-atropine (LOMOTIL) 2.5-0.025 MG tablet Take 1 tablet by mouth 4 (four) times daily as needed for diarrhea or loose stools. 120 tablet 5  . fluconazole (DIFLUCAN) 100 MG tablet TAKE 1 TABLET BY MOUTH EVERY DAY 14 tablet 2  . FLUoxetine (PROZAC) 40 MG capsule TAKE 1 CAPSULE BY MOUTH TWICE A DAY 180 capsule 2  . fluticasone (FLONASE) 50 MCG/ACT nasal spray Place 2 sprays into both nostrils daily. 16 g 6  . HYDROcodone-acetaminophen (NORCO) 7.5-325 MG tablet Take 1 tablet by mouth every 6 (six) hours as needed for moderate pain. 120 tablet 0  . lisinopril-hydrochlorothiazide (PRINZIDE,ZESTORETIC) 10-12.5 MG tablet TAKE 1 TABLET BY MOUTH DAILY. 90 tablet 4  .  montelukast (SINGULAIR) 10 MG tablet Take 1 tablet (10 mg total) by mouth at bedtime. 30 tablet 3  . pantoprazole (PROTONIX) 40 MG tablet TAKE 1 TABLET BY MOUTH TWICE A DAY 60 tablet 6  . pravastatin (PRAVACHOL) 10 MG tablet Take 1 tablet (10 mg total) by mouth daily. 90 tablet 1  . promethazine (PHENERGAN) 25 MG tablet TAKE 1 TABLET BY MOUTH EVERY 6 HOURS AS NEEDED FOR NAUSEA AND VOMITING 30 tablet 2  . promethazine-dextromethorphan (PROMETHAZINE-DM) 6.25-15 MG/5ML syrup Take 5 mLs by mouth 4 (four) times daily as needed for cough. 118 mL 0  . SYMBICORT 80-4.5 MCG/ACT inhaler TAKE 2 PUFFS BY MOUTH TWICE A DAY 10.2 Inhaler 11   No current facility-administered medications for this visit.     Allergies as of 07/30/2018 - Review Complete 07/30/2018  Allergen Reaction Noted  . Nsaids Other (See Comments) 04/21/2014  . Sulfa antibiotics Rash 04/21/2014    ROS:  General: Negative for anorexia, weight loss, fever, chills, fatigue, weakness. ENT: Negative for hoarseness, difficulty swallowing , nasal congestion. CV: Negative for chest pain, angina, palpitations, dyspnea on exertion, peripheral edema.  Respiratory: Negative for dyspnea at rest, dyspnea on exertion, cough, sputum, wheezing.  GI: See history of present illness. GU:  Negative for dysuria, hematuria, urinary incontinence, urinary frequency, nocturnal urination.  Endo: Negative for unusual weight change.    Physical Examination:   BP 120/62  Pulse 83   Ht 5' 7"  (1.702 m)   Wt 188 lb (85.3 kg)   BMI 29.44 kg/m   General: Well-nourished, well-developed in no acute distress.  Eyes: No icterus. Conjunctivae pink. Mouth: Oropharyngeal mucosa moist and pink , no lesions erythema or exudate. Lungs: Clear to auscultation bilaterally. Non-labored. Heart: Regular rate and rhythm, no murmurs rubs or gallops.  Abdomen: Bowel sounds are normal, nontender, nondistended, no hepatosplenomegaly or masses, no abdominal bruits or hernia ,  no rebound or guarding.   Extremities: No lower extremity edema. No clubbing or deformities. Neuro: Alert and oriented x 3.  Grossly intact. Skin: Warm and dry, no jaundice.   Psych: Alert and cooperative, normal mood and affect.  Labs:    Imaging Studies: No results found.  Assessment and Plan:   Kimberly Hudson is a 62 y.o. y/o female who comes in for follow-up with long-standing heartburn and esophageal stricture.  The patient states her dysphasia is stable and she does not want to undergo any endoscopic procedures since her dysphasia comes back shortly after having dilation in the past.  The patient has been stable at this time and will be set up for some lab work due to her Crohn's disease and she will also be set up for a bone density scan.  The patient has been explained the plan and agrees with it.    Kimberly Lame, MD. [name]   Note: This dictation was prepared with Dragon dictation along with smaller phrase technology. Any transcriptional errors that result from this process are unintentional.

## 2018-07-31 ENCOUNTER — Other Ambulatory Visit: Payer: Self-pay

## 2018-07-31 DIAGNOSIS — K50919 Crohn's disease, unspecified, with unspecified complications: Secondary | ICD-10-CM

## 2018-07-31 LAB — CBC WITH DIFFERENTIAL/PLATELET
BASOS ABS: 0 10*3/uL (ref 0.0–0.2)
Basos: 0 %
EOS (ABSOLUTE): 0.1 10*3/uL (ref 0.0–0.4)
EOS: 3 %
HEMATOCRIT: 31.9 % — AB (ref 34.0–46.6)
Hemoglobin: 11.1 g/dL (ref 11.1–15.9)
Immature Grans (Abs): 0 10*3/uL (ref 0.0–0.1)
Immature Granulocytes: 0 %
LYMPHS ABS: 0.7 10*3/uL (ref 0.7–3.1)
Lymphs: 15 %
MCH: 35.8 pg — ABNORMAL HIGH (ref 26.6–33.0)
MCHC: 34.8 g/dL (ref 31.5–35.7)
MCV: 103 fL — ABNORMAL HIGH (ref 79–97)
MONOS ABS: 0.3 10*3/uL (ref 0.1–0.9)
Monocytes: 7 %
NEUTROS PCT: 75 %
Neutrophils Absolute: 3.3 10*3/uL (ref 1.4–7.0)
PLATELETS: 321 10*3/uL (ref 150–450)
RBC: 3.1 x10E6/uL — AB (ref 3.77–5.28)
RDW: 18.1 % — AB (ref 12.3–15.4)
WBC: 4.5 10*3/uL (ref 3.4–10.8)

## 2018-07-31 LAB — COMPREHENSIVE METABOLIC PANEL
ALK PHOS: 98 IU/L (ref 39–117)
ALT: 13 IU/L (ref 0–32)
AST: 28 IU/L (ref 0–40)
Albumin/Globulin Ratio: 1.3 (ref 1.2–2.2)
Albumin: 3.8 g/dL (ref 3.6–4.8)
BUN/Creatinine Ratio: 13 (ref 12–28)
BUN: 13 mg/dL (ref 8–27)
Bilirubin Total: 0.4 mg/dL (ref 0.0–1.2)
CO2: 28 mmol/L (ref 20–29)
CREATININE: 1.02 mg/dL — AB (ref 0.57–1.00)
Calcium: 9.3 mg/dL (ref 8.7–10.3)
Chloride: 96 mmol/L (ref 96–106)
GFR calc Af Amer: 69 mL/min/{1.73_m2} (ref >59)
GFR calc non Af Amer: 60 mL/min/{1.73_m2} (ref >59)
GLOBULIN, TOTAL: 2.9 g/dL (ref 1.5–4.5)
Glucose: 95 mg/dL (ref 65–99)
POTASSIUM: 4.2 mmol/L (ref 3.5–5.2)
SODIUM: 136 mmol/L (ref 134–144)
Total Protein: 6.7 g/dL (ref 6.0–8.5)

## 2018-07-31 LAB — B12 AND FOLATE PANEL
FOLATE: 8.5 ng/mL (ref >3.0)
VITAMIN B 12: 218 pg/mL — AB (ref 232–1245)

## 2018-07-31 NOTE — Telephone Encounter (Signed)
Pt left vm she needs prior auth for her rx for Crohn diseas she had medicare # [phone] and [employer] # [phone]

## 2018-07-31 NOTE — Patient Instructions (Signed)
Dexa scan scheduled at [facility] on Monday, Oct 7th at 10:20am. Pt has been informed of date and time. She was also advised to hold any calcium supplements the morning of the scan.

## 2018-08-03 ENCOUNTER — Other Ambulatory Visit: Payer: Self-pay | Admitting: Family Medicine

## 2018-08-03 ENCOUNTER — Telehealth: Payer: Self-pay

## 2018-08-03 NOTE — Telephone Encounter (Signed)
-----   Message from Kimberly Lame, MD sent at 08/03/2018  2:36 PM EDT ----- The patient know that her blood count is slightly low and her B12 came back low.  The patient should be started on B12.  Her folate was normal.

## 2018-08-03 NOTE — Telephone Encounter (Signed)
LVM for pt to return my call.

## 2018-08-04 NOTE — Telephone Encounter (Signed)
Pt notified of lab results

## 2018-08-06 ENCOUNTER — Other Ambulatory Visit: Payer: Self-pay | Admitting: Family Medicine

## 2018-08-06 DIAGNOSIS — M1711 Unilateral primary osteoarthritis, right knee: Secondary | ICD-10-CM

## 2018-08-06 MED ORDER — HYDROCODONE-ACETAMINOPHEN 7.5-325 MG PO TABS: 1.0000 | ORAL_TABLET | Freq: Four times a day (QID) | ORAL | 0 refills | Status: DC | PRN

## 2018-08-10 ENCOUNTER — Telehealth: Payer: Self-pay | Admitting: Gastroenterology

## 2018-08-10 NOTE — Telephone Encounter (Signed)
Imuran prior authorization was submitted and approved.   Status: Approved, Coverage Starts on: 11/16/2017, Coverage Ends on: 11/17/2018

## 2018-08-10 NOTE — Telephone Encounter (Signed)
Pt is calling  Her rx needs prior auth for rx  Generic imurin  and rx generic Lomodil

## 2018-08-16 ENCOUNTER — Other Ambulatory Visit: Payer: Self-pay | Admitting: Family Medicine

## 2018-08-18 ENCOUNTER — Telehealth: Payer: Self-pay | Admitting: Family Medicine

## 2018-08-18 NOTE — Telephone Encounter (Signed)
Please advise 

## 2018-08-18 NOTE — Telephone Encounter (Signed)
Blair Hailey is calling regarding pt trying to get the medications below lowered to a cheaper tier.  Needing to know what prior Rx's were tried before these medications were prescribed.  cyclobenzaprine (FLEXERIL) 5 MG tablet  diphenoxylate-atropine (LOMOTIL) 2.5-0.025 MG tablet  [employer]'s Fax is:   [phone]  Please advise.  Thanks, [employer]

## 2018-08-18 NOTE — Telephone Encounter (Signed)
She's been taking cyclobenzaprine for years, I don't think there are any other muscle relaxers that she has taken.   She has taken Bentyl for GI sx, but she take Lomotil when Bentyl is not effective.

## 2018-08-21 ENCOUNTER — Other Ambulatory Visit: Payer: Self-pay | Admitting: Family Medicine

## 2018-08-21 DIAGNOSIS — R942 Abnormal results of pulmonary function studies: Secondary | ICD-10-CM

## 2018-08-21 DIAGNOSIS — R05 Cough: Secondary | ICD-10-CM

## 2018-08-21 DIAGNOSIS — R059 Cough, unspecified: Secondary | ICD-10-CM

## 2018-08-21 NOTE — Telephone Encounter (Addendum)
Returned call to [employer]. Will receive fax with approval for both medications.

## 2018-08-24 ENCOUNTER — Other Ambulatory Visit: Payer: Disability Insurance

## 2018-09-02 ENCOUNTER — Encounter: Payer: Self-pay | Admitting: Family Medicine

## 2018-09-02 ENCOUNTER — Other Ambulatory Visit: Payer: Self-pay | Admitting: Family Medicine

## 2018-09-02 ENCOUNTER — Ambulatory Visit (INDEPENDENT_AMBULATORY_CARE_PROVIDER_SITE_OTHER): Payer: Medicare HMO | Admitting: Family Medicine

## 2018-09-02 VITALS — BP 120/70 | HR 74 | Temp 98.7°F | Resp 16 | Wt 194.0 lb

## 2018-09-02 DIAGNOSIS — M1711 Unilateral primary osteoarthritis, right knee: Secondary | ICD-10-CM

## 2018-09-02 DIAGNOSIS — G2581 Restless legs syndrome: Secondary | ICD-10-CM | POA: Diagnosis not present

## 2018-09-02 DIAGNOSIS — Z23 Encounter for immunization: Secondary | ICD-10-CM

## 2018-09-02 DIAGNOSIS — F329 Major depressive disorder, single episode, unspecified: Secondary | ICD-10-CM

## 2018-09-02 DIAGNOSIS — R69 Illness, unspecified: Secondary | ICD-10-CM | POA: Diagnosis not present

## 2018-09-02 DIAGNOSIS — F419 Anxiety disorder, unspecified: Secondary | ICD-10-CM | POA: Diagnosis not present

## 2018-09-02 DIAGNOSIS — F32A Depression, unspecified: Secondary | ICD-10-CM

## 2018-09-02 MED ORDER — ROPINIROLE HCL 0.5 MG PO TABS: ORAL_TABLET | ORAL | 1 refills | Status: DC

## 2018-09-02 NOTE — Progress Notes (Signed)
Patient: Kimberly Hudson Female    DOB: 1956-10-29   62 y.o.   MRN: [id number] Visit Date: 09/02/2018  Today's Provider: Lelon Huh, MD   Chief Complaint  Patient presents with  . Insomnia   Subjective:    HPI Restless Leg and Insomnia: Patient comes in today requesting a dosage change for Clonazepam. She states the current dose she takes is no longer effective. She states she has been taking an extra dose in the middle of the day. She reports that she only gets 3-4 hours of sleep each night.   She also reports having a great deal of stress in her life which she is having trouble coping with. States that fluoxetine has worked well for her mood but stress is much worse now and is now strong enough.     Allergies  Allergen Reactions  . Nsaids Other (See Comments)    Because of Crohns Because of Crohns  . Sulfa Antibiotics Rash     Current Outpatient Medications:  .  azaTHIOprine (IMURAN) 50 MG tablet, Take 3 tablets (150 mg total) by mouth daily. **PLEASE SCHEDULE FOLLOW UP APPT**, Disp: 90 tablet, Rfl: 3 .  clonazePAM (KLONOPIN) 1 MG tablet, TAKE 1 TABLET BY MOUTH EVERYDAY AT BEDTIME, Disp: 30 tablet, Rfl: 5 .  cyclobenzaprine (FLEXERIL) 5 MG tablet, TAKE 1 TABLET BY MOUTH THREE TIMES A DAY AS NEEDED FOR MUSCLE SPASMS, Disp: 90 tablet, Rfl: 5 .  dicyclomine (BENTYL) 20 MG tablet, TAKE 1 TABLET (20 MG TOTAL) BY MOUTH 3 (THREE) TIMES DAILY BEFORE MEALS., Disp: 90 tablet, Rfl: 5 .  diphenoxylate-atropine (LOMOTIL) 2.5-0.025 MG tablet, Take 1 tablet by mouth 4 (four) times daily as needed for diarrhea or loose stools., Disp: 120 tablet, Rfl: 5 .  fluconazole (DIFLUCAN) 100 MG tablet, TAKE 1 TABLET BY MOUTH EVERY DAY, Disp: 14 tablet, Rfl: 2 .  FLUoxetine (PROZAC) 40 MG capsule, TAKE 1 CAPSULE BY MOUTH TWICE A DAY, Disp: 180 capsule, Rfl: 2 .  fluticasone (FLONASE) 50 MCG/ACT nasal spray, Place 2 sprays into both nostrils daily., Disp: 16 g, Rfl: 6 .   HYDROcodone-acetaminophen (NORCO) 7.5-325 MG tablet, Take 1 tablet by mouth every 6 (six) hours as needed for moderate pain., Disp: 120 tablet, Rfl: 0 .  lisinopril-hydrochlorothiazide (PRINZIDE,ZESTORETIC) 10-12.5 MG tablet, TAKE 1 TABLET BY MOUTH DAILY., Disp: 90 tablet, Rfl: 4 .  montelukast (SINGULAIR) 10 MG tablet, TAKE 1 TABLET BY MOUTH EVERYDAY AT BEDTIME, Disp: 90 tablet, Rfl: 4 .  pantoprazole (PROTONIX) 40 MG tablet, TAKE 1 TABLET BY MOUTH TWICE A DAY, Disp: 60 tablet, Rfl: 6 .  pravastatin (PRAVACHOL) 10 MG tablet, TAKE 1 TABLET BY MOUTH EVERY DAY, Disp: 90 tablet, Rfl: 1 .  promethazine (PHENERGAN) 25 MG tablet, TAKE 1 TABLET BY MOUTH EVERY 6 HOURS AS NEEDED FOR NAUSEA AND VOMITING, Disp: 30 tablet, Rfl: 2 .  promethazine-dextromethorphan (PROMETHAZINE-DM) 6.25-15 MG/5ML syrup, Take 5 mLs by mouth 4 (four) times daily as needed for cough., Disp: 118 mL, Rfl: 0 .  SYMBICORT 80-4.5 MCG/ACT inhaler, TAKE 2 PUFFS BY MOUTH TWICE A DAY, Disp: 10.2 Inhaler, Rfl: 11  Review of Systems  Constitutional: Negative for appetite change, chills, fatigue and fever.  Respiratory: Negative for chest tightness and shortness of breath.   Cardiovascular: Negative for chest pain and palpitations.  Gastrointestinal: Negative for abdominal pain, nausea and vomiting.  Musculoskeletal: Positive for arthralgias (right knee), joint swelling (right knee) and myalgias (leg pain).  Neurological: Positive for numbness (  in toes). Negative for dizziness and weakness.  Psychiatric/Behavioral: Positive for sleep disturbance (trouble falling alseep and staying asleep).    Social History   Tobacco Use  . Smoking status: Former Smoker    Packs/day: 1.00    Years: 10.00    Pack years: 10.00    Types: Cigarettes    Last attempt to quit: 11/18/2012    Years since quitting: 5.7  . Smokeless tobacco: Never Used  . Tobacco comment: smoked up to 1 ppd for about 10 years.   Substance Use Topics  . Alcohol use: Yes     Alcohol/week: 0.0 standard drinks    Comment: every once in a while   Objective:   BP 120/70 (BP Location: Left Arm, Patient Position: Sitting, Cuff Size: Large)   Pulse 74   Temp 98.7 F (37.1 C) (Oral)   Resp 16   Wt 194 lb (88 kg)   SpO2 98% Comment: room air  BMI 30.38 kg/m  Vitals:   09/02/18 1453  BP: 120/70  Pulse: 74  Resp: 16  Temp: 98.7 F (37.1 C)  TempSrc: Oral  SpO2: 98%  Weight: 194 lb (88 kg)     Physical Exam  General appearance: alert, well developed, well nourished, cooperative and in no distress Head: Normocephalic, without obvious abnormality, atraumatic Respiratory: Respirations even and unlabored, normal respiratory rate Extremities: No gross deformities Skin: Skin color, texture, turgor normal. No rashes seen  Psych: Appropriate mood and affect. Neurologic: Mental status: Alert, oriented to person, place, and time, thought content appropriate.     Assessment & Plan:     1. Anxiety Continue fluoxetine.  - Ambulatory referral to Psychology  2. Depression, unspecified depression type  - Ambulatory referral to Psychology  3. Need for influenza vaccination  - Flu Vaccine QUAD 6+ mos PF IM (Fluarix Quad PF)  4. Restless leg syndrome Add.  - rOPINIRole (REQUIP) 0.5 MG tablet; Take 1/2 tablet at bedtime x 3 days, then 1 at bedtime for 3 days, then increase to 2 at bedtime if needed.  Dispense: 30 tablet; Refill: 1       Lelon Huh, MD  [facility]

## 2018-09-03 MED ORDER — HYDROCODONE-ACETAMINOPHEN 7.5-325 MG PO TABS: 1.0000 | ORAL_TABLET | Freq: Four times a day (QID) | ORAL | 0 refills | Status: DC | PRN

## 2018-09-10 ENCOUNTER — Other Ambulatory Visit: Payer: Self-pay | Admitting: Family Medicine

## 2018-09-17 ENCOUNTER — Other Ambulatory Visit: Payer: Disability Insurance

## 2018-09-21 ENCOUNTER — Ambulatory Visit (INDEPENDENT_AMBULATORY_CARE_PROVIDER_SITE_OTHER): Payer: Medicare HMO | Admitting: Licensed Clinical Social Worker

## 2018-09-21 DIAGNOSIS — F419 Anxiety disorder, unspecified: Secondary | ICD-10-CM

## 2018-09-21 DIAGNOSIS — R69 Illness, unspecified: Secondary | ICD-10-CM | POA: Diagnosis not present

## 2018-09-21 DIAGNOSIS — F32A Depression, unspecified: Secondary | ICD-10-CM

## 2018-09-21 DIAGNOSIS — F329 Major depressive disorder, single episode, unspecified: Secondary | ICD-10-CM
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
## 2018-09-26 ENCOUNTER — Ambulatory Visit (INDEPENDENT_AMBULATORY_CARE_PROVIDER_SITE_OTHER): Payer: Medicare HMO | Admitting: Family Medicine

## 2018-09-26 ENCOUNTER — Encounter: Payer: Self-pay | Admitting: Family Medicine

## 2018-09-26 VITALS — BP 103/68 | HR 90 | Temp 99.0°F | Resp 16 | Wt 193.0 lb

## 2018-09-26 DIAGNOSIS — J410 Simple chronic bronchitis: Secondary | ICD-10-CM | POA: Diagnosis not present

## 2018-09-26 DIAGNOSIS — R059 Cough, unspecified: Secondary | ICD-10-CM

## 2018-09-26 DIAGNOSIS — R05 Cough: Secondary | ICD-10-CM

## 2018-09-26 MED ORDER — PROMETHAZINE-DM 6.25-15 MG/5ML PO SYRP: 5.0000 mL | ORAL_SOLUTION | Freq: Four times a day (QID) | ORAL | 0 refills | Status: DC | PRN

## 2018-09-26 MED ORDER — DOXYCYCLINE HYCLATE 100 MG PO TABS: 100.0000 mg | ORAL_TABLET | Freq: Two times a day (BID) | ORAL | 0 refills | Status: DC

## 2018-09-26 NOTE — Progress Notes (Signed)
Patient: Kimberly Hudson Female    DOB: Jan 21, 1956   62 y.o.   MRN: [id number] Visit Date: 09/26/2018  Today's Provider: Wilhemena Durie, MD   Chief Complaint  Patient presents with  . Cough    x 4 days   Subjective:    Cough  This is a new problem. Episode onset: 4 days ago. The problem has been gradually worsening. The cough is productive of sputum (green colored sputum). Associated symptoms include ear pain (left ear pain), headaches, rhinorrhea, shortness of breath, sweats and wheezing. Pertinent negatives include no chills, fever, hemoptysis, nasal congestion, postnasal drip or sore throat. Treatments tried: Robitussin. The treatment provided no relief.       Allergies  Allergen Reactions  . Nsaids Other (See Comments)    Because of Crohns Because of Crohns  . Sulfa Antibiotics Rash     Current Outpatient Medications:  .  azaTHIOprine (IMURAN) 50 MG tablet, Take 3 tablets (150 mg total) by mouth daily. **PLEASE SCHEDULE FOLLOW UP APPT**, Disp: 90 tablet, Rfl: 3 .  clonazePAM (KLONOPIN) 1 MG tablet, TAKE 1 TABLET BY MOUTH EVERYDAY AT BEDTIME, Disp: 30 tablet, Rfl: 5 .  cyclobenzaprine (FLEXERIL) 5 MG tablet, TAKE 1 TABLET BY MOUTH THREE TIMES A DAY AS NEEDED FOR MUSCLE SPASMS, Disp: 90 tablet, Rfl: 5 .  dicyclomine (BENTYL) 20 MG tablet, TAKE 1 TABLET (20 MG TOTAL) BY MOUTH 3 (THREE) TIMES DAILY BEFORE MEALS., Disp: 90 tablet, Rfl: 5 .  diphenoxylate-atropine (LOMOTIL) 2.5-0.025 MG tablet, Take 1 tablet by mouth 4 (four) times daily as needed for diarrhea or loose stools., Disp: 120 tablet, Rfl: 5 .  fluconazole (DIFLUCAN) 100 MG tablet, TAKE 1 TABLET BY MOUTH EVERY DAY, Disp: 14 tablet, Rfl: 2 .  FLUoxetine (PROZAC) 40 MG capsule, TAKE 1 CAPSULE BY MOUTH TWICE A DAY, Disp: 180 capsule, Rfl: 2 .  fluticasone (FLONASE) 50 MCG/ACT nasal spray, Place 2 sprays into both nostrils daily., Disp: 16 g, Rfl: 6 .  HYDROcodone-acetaminophen (NORCO) 7.5-325 MG tablet, Take 1  tablet by mouth every 6 (six) hours as needed for moderate pain., Disp: 120 tablet, Rfl: 0 .  lisinopril-hydrochlorothiazide (PRINZIDE,ZESTORETIC) 10-12.5 MG tablet, TAKE 1 TABLET BY MOUTH DAILY., Disp: 90 tablet, Rfl: 4 .  montelukast (SINGULAIR) 10 MG tablet, TAKE 1 TABLET BY MOUTH EVERYDAY AT BEDTIME, Disp: 90 tablet, Rfl: 4 .  pantoprazole (PROTONIX) 40 MG tablet, TAKE 1 TABLET BY MOUTH TWICE A DAY, Disp: 60 tablet, Rfl: 6 .  pravastatin (PRAVACHOL) 10 MG tablet, TAKE 1 TABLET BY MOUTH EVERY DAY, Disp: 90 tablet, Rfl: 1 .  promethazine (PHENERGAN) 25 MG tablet, TAKE 1 TABLET BY MOUTH EVERY 6 HOURS AS NEEDED FOR NAUSEA AND VOMITING, Disp: 30 tablet, Rfl: 2 .  promethazine-dextromethorphan (PROMETHAZINE-DM) 6.25-15 MG/5ML syrup, Take 5 mLs by mouth 4 (four) times daily as needed for cough., Disp: 118 mL, Rfl: 0 .  rOPINIRole (REQUIP) 0.5 MG tablet, Take 1/2 tablet at bedtime x 3 days, then 1 at bedtime for 3 days, then increase to 2 at bedtime if needed., Disp: 30 tablet, Rfl: 1 .  SYMBICORT 80-4.5 MCG/ACT inhaler, TAKE 2 PUFFS BY MOUTH TWICE A DAY, Disp: 10.2 Inhaler, Rfl: 11  Review of Systems  Constitutional: Negative for chills and fever.  HENT: Positive for ear pain (left ear pain) and rhinorrhea. Negative for postnasal drip and sore throat.   Respiratory: Positive for cough, shortness of breath and wheezing. Negative for hemoptysis.   Neurological: Positive  for headaches.    Social History   Tobacco Use  . Smoking status: Former Smoker    Packs/day: 1.00    Years: 10.00    Pack years: 10.00    Types: Cigarettes    Last attempt to quit: 11/18/2012    Years since quitting: 5.8  . Smokeless tobacco: Never Used  . Tobacco comment: smoked up to 1 ppd for about 10 years.   Substance Use Topics  . Alcohol use: Yes    Alcohol/week: 0.0 standard drinks    Comment: every once in a while   Objective:   BP 103/68 (BP Location: Left Arm, Patient Position: Sitting, Cuff Size: Large)    Pulse 90   Temp 99 F (37.2 C) (Oral)   Resp 16   Wt 193 lb (87.5 kg)   SpO2 96% Comment: room air  BMI 30.23 kg/m  Vitals:   09/26/18 1010  BP: 103/68  Pulse: 90  Resp: 16  Temp: 99 F (37.2 C)  TempSrc: Oral  SpO2: 96%  Weight: 193 lb (87.5 kg)     Physical Exam  Constitutional: She is oriented to person, place, and time. She appears well-developed and well-nourished.  HENT:  Head: Normocephalic and atraumatic.  Right Ear: External ear normal.  Left Ear: External ear normal.  Nose: Nose normal.  Eyes: Conjunctivae are normal. No scleral icterus.  Neck: No thyromegaly present.  Cardiovascular: Normal rate, regular rhythm and normal heart sounds.  Pulmonary/Chest: Effort normal and breath sounds normal. No respiratory distress. She has no wheezes.  Abdominal: Soft.  Musculoskeletal: She exhibits no edema.  Neurological: She is alert and oriented to person, place, and time.  Skin: Skin is warm and dry.        Assessment & Plan:     1. Cough  - promethazine-dextromethorphan (PROMETHAZINE-DM) 6.25-15 MG/5ML syrup; Take 5 mLs by mouth 4 (four) times daily as needed for cough.  Dispense: 118 mL; Refill: 0  2. Simple chronic bronchitis (HCC) Doxycycline  I have done the exam and reviewed the chart and it is accurate to the best of my knowledge. Development worker, community has been used and  any errors in dictation or transcription are unintentional. Miguel Aschoff M.D. [place], MD  [facility]

## 2018-09-30 ENCOUNTER — Other Ambulatory Visit: Payer: Self-pay

## 2018-09-30 ENCOUNTER — Ambulatory Visit (INDEPENDENT_AMBULATORY_CARE_PROVIDER_SITE_OTHER): Payer: Medicare HMO | Admitting: Psychiatry

## 2018-09-30 ENCOUNTER — Encounter: Payer: Self-pay | Admitting: Psychiatry

## 2018-09-30 VITALS — BP 115/71 | HR 85 | Temp 97.7°F | Wt 197.0 lb

## 2018-09-30 DIAGNOSIS — F4321 Adjustment disorder with depressed mood: Secondary | ICD-10-CM | POA: Diagnosis not present

## 2018-09-30 DIAGNOSIS — F331 Major depressive disorder, recurrent, moderate: Secondary | ICD-10-CM

## 2018-09-30 DIAGNOSIS — F5105 Insomnia due to other mental disorder: Secondary | ICD-10-CM

## 2018-09-30 DIAGNOSIS — F1021 Alcohol dependence, in remission: Secondary | ICD-10-CM | POA: Diagnosis not present

## 2018-09-30 DIAGNOSIS — R69 Illness, unspecified: Secondary | ICD-10-CM | POA: Diagnosis not present

## 2018-09-30 DIAGNOSIS — F4381 Prolonged grief disorder: Secondary | ICD-10-CM

## 2018-09-30 MED ORDER — MIRTAZAPINE 7.5 MG PO TABS: 7.5000 mg | ORAL_TABLET | Freq: Every day | ORAL | 1 refills | Status: DC

## 2018-09-30 MED ORDER — CLONAZEPAM 1 MG PO TABS: 0.5000 mg | ORAL_TABLET | ORAL | 5 refills | Status: DC

## 2018-09-30 NOTE — Patient Instructions (Signed)
Mirtazapine tablets What is this medicine? MIRTAZAPINE (mir TAZ a peen) is used to treat depression. This medicine may be used for other purposes; ask your health care provider or pharmacist if you have questions. COMMON BRAND NAME(S): Remeron What should I tell my health care provider before I take this medicine? They need to know if you have any of these conditions: -bipolar disorder -glaucoma -kidney disease -liver disease -suicidal thoughts -an unusual or allergic reaction to mirtazapine, other medicines, foods, dyes, or preservatives -pregnant or trying to get pregnant -breast-feeding How should I use this medicine? Take this medicine by mouth with a glass of water. Follow the directions on the prescription label. Take your medicine at regular intervals. Do not take your medicine more often than directed. Do not stop taking this medicine suddenly except upon the advice of your doctor. Stopping this medicine too quickly may cause serious side effects or your condition may worsen. A special MedGuide will be given to you by the pharmacist with each prescription and refill. Be sure to read this information carefully each time. Talk to your pediatrician regarding the use of this medicine in children. Special care may be needed. Overdosage: If you think you have taken too much of this medicine contact a poison control center or emergency room at once. NOTE: This medicine is only for you. Do not share this medicine with others. What if I miss a dose? If you miss a dose, take it as soon as you can. If it is almost time for your next dose, take only that dose. Do not take double or extra doses. What may interact with this medicine? Do not take this medicine with any of the following medications: -linezolid -MAOIs like Carbex, Eldepryl, Marplan, Nardil, and Parnate -methylene blue (injected into a vein) This medicine may also interact with the following medications: -alcohol -antiviral  medicines for HIV or AIDS -certain medicines that treat or prevent blood clots like warfarin -certain medicines for depression, anxiety, or psychotic disturbances -certain medicines for fungal infections like ketoconazole and itraconazole -certain medicines for migraine headache like almotriptan, eletriptan, frovatriptan, naratriptan, rizatriptan, sumatriptan, zolmitriptan -certain medicines for seizures like carbamazepine or phenytoin -certain medicines for sleep -cimetidine -erythromycin -fentanyl -lithium -medicines for blood pressure -nefazodone -rasagiline -rifampin -supplements like St. John's wort, kava kava, valerian -tramadol -tryptophan This list may not describe all possible interactions. Give your health care provider a list of all the medicines, herbs, non-prescription drugs, or dietary supplements you use. Also tell them if you smoke, drink alcohol, or use illegal drugs. Some items may interact with your medicine. What should I watch for while using this medicine? Tell your doctor if your symptoms do not get better or if they get worse. Visit your doctor or health care professional for regular checks on your progress. Because it may take several weeks to see the full effects of this medicine, it is important to continue your treatment as prescribed by your doctor. Patients and their families should watch out for new or worsening thoughts of suicide or depression. Also watch out for sudden changes in feelings such as feeling anxious, agitated, panicky, irritable, hostile, aggressive, impulsive, severely restless, overly excited and hyperactive, or not being able to sleep. If this happens, especially at the beginning of treatment or after a change in dose, call your health care professional. You may get drowsy or dizzy. Do not drive, use machinery, or do anything that needs mental alertness until you know how this medicine affects you. Do not   stand or sit up quickly, especially if  you are an older patient. This reduces the risk of dizzy or fainting spells. Alcohol may interfere with the effect of this medicine. Avoid alcoholic drinks. This medicine may cause dry eyes and blurred vision. If you wear contact lenses you may feel some discomfort. Lubricating drops may help. See your eye doctor if the problem does not go away or is severe. Your mouth may get dry. Chewing sugarless gum or sucking hard candy, and drinking plenty of water may help. Contact your doctor if the problem does not go away or is severe. What side effects may I notice from receiving this medicine? Side effects that you should report to your doctor or health care professional as soon as possible: -allergic reactions like skin rash, itching or hives, swelling of the face, lips, or tongue -anxious -changes in vision -chest pain -confusion -elevated mood, decreased need for sleep, racing thoughts, impulsive behavior -eye pain -fast, irregular heartbeat -feeling faint or lightheaded, falls -feeling agitated, angry, or irritable -fever or chills, sore throat -hallucination, loss of contact with reality -loss of balance or coordination -mouth sores -redness, blistering, peeling or loosening of the skin, including inside the mouth -restlessness, pacing, inability to keep still -seizures -stiff muscles -suicidal thoughts or other mood changes -trouble passing urine or change in the amount of urine -trouble sleeping -unusual bleeding or bruising -unusually weak or tired -vomiting Side effects that usually do not require medical attention (report to your doctor or health care professional if they continue or are bothersome): -change in appetite -constipation -dizziness -dry mouth -muscle aches or pains -nausea -tired -weight gain This list may not describe all possible side effects. Call your doctor for medical advice about side effects. You may report side effects to FDA at 1-800-FDA-1088. Where  should I keep my medicine? Keep out of the reach of children. Store at room temperature between 15 and 30 degrees C (59 and 86 degrees F) Protect from light and moisture. Throw away any unused medicine after the expiration date. NOTE: This sheet is a summary. It may not cover all possible information. If you have questions about this medicine, talk to your doctor, pharmacist, or health care provider.  2018 [employer]/Gold Standard (2016-04-04 17:30:45)  

## 2018-09-30 NOTE — Progress Notes (Signed)
Psychiatric Initial Adult Assessment   Patient Identification: Kimberly Hudson MRN:  [id number] Date of Evaluation:  09/30/2018 Referral Source: Dr.Donald Fisher Chief Complaint:' I am here to establish care."   Chief Complaint    Establish Care; Anxiety; Depression; Insomnia     Visit Diagnosis:    ICD-10-CM   1. MDD (major depressive disorder), recurrent episode, moderate (HCC) F33.1   2. Grief reaction with prolonged bereavement F43.21   3. Insomnia due to mental disorder F51.05   4. Alcohol use disorder, moderate, in sustained remission ([place]) F10.21     History of Present Illness:  Kimberly Hudson is a 62 yr old Caucasian female, widowed, lives in [facility], has a history of depression, bereavement, insomnia, Crohn's disease, chronic pain, hypertension, presented to the clinic today to establish care.  Patient reports she struggles with depressive symptoms since the past several years.  She reports her depressive symptoms started after the death of her husband and her son.  Her husband died of a drug overdose in 01/21/2003.  Patient reports her son passed away of a drug overdose in 01-21-07.  Patient reports she can never get the picture out of her mind of finding her son dead.  She reports she was the first one to find him.  She reports she lives with that image in her mind every single day of her life.  Some days are more difficult than others.  She also reports that her ex boyfriend died a few weeks ago .She describes her depressive symptoms as sadness, crying spells, sleep problems, lack of motivation, decreased appetite and so on on a regular basis.  She however reports she is currently on Prozac which was increased to 80 mg few weeks ago by her PMD.  Patient reports it has helped with her symptoms and she feels more even now with regards to her mood symptoms.  Patient reports she does have a history of anxiety symptoms.  She reports she has had panic attacks in the past usually in crowded places.  She  reports that has gotten better and now she has it 3-4 times a year or so.  Patient reports sleep is a major problem for her.  She reports she has difficulty falling asleep as well as maintaining sleep.  She reports once a week she cannot sleep at all.  She reports she does have restless leg syndrome.  She was started on Klonopin by her primary medical doctor for RLS and then Requip was added.  She reports she continues to struggle with sleep issues in spite of these medications.  She reports she has tried trazodone in the past which made her groggy.  She did try Seroquel years ago after the death of her son and it helped to some extent at that point.  Patient denies any other history of trauma in her life other than finding her son dead as mentioned above.  Patient denies any manic or hypomanic symptoms.  Patient denies any suicidality or homicidality.  Patient denies any perceptual disturbances.  Patient does struggle with multiple medical problems.  She reports she has Crohn's disease and she is on medications for the same.  She reports she has an esophageal stricture and hence it is difficult for her to have full meals.  She reports that does have an impact on her appetite.  She however manages to take at least 1 for meals per day.  She reports she does a lot of snacking throughout the day.  She also  struggles with arthritis and chronic pain.  Patient reports she has good social support from her daughter Kimberly Hudson as well as her grand children.  Her grand son who is 4 years old lives with her.  She reports he is her major support and calls him her' rock' .  Patient has already started psychotherapy sessions with Ms. Peacock here in clinic.  Patient is motivated to stay in therapy.  Associated Signs/Symptoms: Depression Symptoms:  depressed mood, insomnia, anxiety, decreased appetite, (Hypo) Manic Symptoms:  denies Anxiety Symptoms:  Panic Symptoms, Social Anxiety, Psychotic Symptoms:   denies PTSD Symptoms: Had a traumatic exposure:  as noted above  Past Psychiatric History: Patient with history of depression, anxiety and grief, was started on medications by her primary medical doctor.  Patient denies any inpatient mental health admissions.  Patient denies any suicide attempts.  Previous Psychotropic Medications: Yes Prozac, Seroquel, trazodone, Klonopin  Substance Abuse History in the last 12 months:  No.  Pt does report a history of heavy alcohol abuse from 2008 - 2010.  She reports started abusing alcohol after the death of her son.  She however quit drinking in 2010.  Consequences of Substance Abuse: Negative  Past Medical History:  Past Medical History:  Diagnosis Date  . Anemia    past hx  . Anxiety   . Arthritis    osteo - shoulders, hips, knees  . Complication of anesthesia    propofol causes headaches  . Coronary artery disease   . Crohn's disease ([place])   . Depression   . GERD (gastroesophageal reflux disease)   . History of chicken pox   . History of measles   . History of mumps   . Hypertension   . 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 Wears dentures    full upper    Past Surgical History:  Procedure Laterality Date  . COLONOSCOPY  02/2013   No polyps were found  . ESOPHAGEAL DILATION  05/29/2017   Procedure: ESOPHAGEAL DILATION;  Surgeon: Lucilla Lame, MD;  Location: [place];  Service: Endoscopy;;  . ESOPHAGOGASTRODUODENOSCOPY (EGD) WITH PROPOFOL N/A 06/28/2015   Procedure: ESOPHAGOGASTRODUODENOSCOPY (EGD) WITH PROPOFOL;  Surgeon: Lucilla Lame, MD;  Location: [place];  Service: Endoscopy;  Laterality: N/A;  with dialation  . ESOPHAGOGASTRODUODENOSCOPY (EGD) WITH PROPOFOL N/A 07/26/2015   Procedure: ESOPHAGOGASTRODUODENOSCOPY (EGD) WITH PROPOFOL, with dialation;  Surgeon: Lucilla Lame, MD;  Location: [place];  Service: Endoscopy;  Laterality: N/A;  LEAVE PT LAST  . ESOPHAGOGASTRODUODENOSCOPY  (EGD) WITH PROPOFOL N/A 08/05/2016   Procedure: ESOPHAGOGASTRODUODENOSCOPY (EGD) WITH PROPOFOL;  Surgeon: Lucilla Lame, MD;  Location: [place];  Service: Endoscopy;  Laterality: N/A;  . ESOPHAGOGASTRODUODENOSCOPY (EGD) WITH PROPOFOL N/A 09/26/2016   Procedure: ESOPHAGOGASTRODUODENOSCOPY (EGD) WITH PROPOFOL;  Surgeon: Lucilla Lame, MD;  Location: [place];  Service: Endoscopy;  Laterality: N/A;  . ESOPHAGOGASTRODUODENOSCOPY (EGD) WITH PROPOFOL N/A 05/29/2017   Procedure: ESOPHAGOGASTRODUODENOSCOPY (EGD) WITH PROPOFOL - Injection of steroid in esophagus;  Surgeon: Lucilla Lame, MD;  Location: [place];  Service: Endoscopy;  Laterality: N/A;  . Excision BCC  07/28/2013   right nares, Georgiann Cocker  . TUBAL LIGATION  1986  . UPPER GI ENDOSCOPY  02/2014    Family Psychiatric History: Son -drug abuse ( deceased from drug overdose), nephew-drug abuse( deceased) , niece-drug abuse ( deceased) , sister-anxiety, depression-other sister-anxiety, depression  Family History:  Family History  Problem Relation Age of Onset  . COPD Mother   . Heart disease Mother   .  COPD Father   . Emphysema Father   . Prostate cancer Father   . Alcohol abuse Father   . Anxiety disorder Father   . Depression Father   . Schizophrenia Father   . COPD Sister   . Heart disease Sister   . Alcohol abuse Sister   . Anxiety disorder Sister   . Depression Sister   . Alcohol abuse Brother   . Alcohol abuse Sister   . Anxiety disorder Sister   . Depression Sister   . Alcohol abuse Sister   . Anxiety disorder Sister   . Depression Sister   . Anxiety disorder Sister   . Depression Sister   . Anxiety disorder Sister   . Depression Sister   . Drug abuse Son   . Drug abuse Other   . Drug abuse Other   . Drug abuse Other     Social History:   Social History   Socioeconomic History  . Marital status: Widowed    Spouse name: Not on file  . Number of children: 2  . Years of education:  Not on file  . Highest education level: High school graduate  Occupational History  . Occupation: Investment banker, operational  . Financial resource strain: Somewhat hard  . Food insecurity:    Worry: Sometimes true    Inability: Sometimes true  . Transportation needs:    Medical: No    Non-medical: No  Tobacco Use  . Smoking status: Former Smoker    Packs/day: 1.00    Years: 10.00    Pack years: 10.00    Types: Cigarettes    Last attempt to quit: 11/18/2012    Years since quitting: 5.8  . Smokeless tobacco: Never Used  . Tobacco comment: smoked up to 1 ppd for about 10 years.   Substance and Sexual Activity  . Alcohol use: Not Currently    Alcohol/week: 0.0 standard drinks    Comment: every once in a while  . Drug use: No  . Sexual activity: Not Currently  Lifestyle  . Physical activity:    Days per week: 0 days    Minutes per session: 0 min  . Stress: Very much  Relationships  . Social connections:    Talks on phone: Not on file    Gets together: Not on file    Attends religious service: Never    Active member of club or organization: No    Attends meetings of clubs or organizations: Never    Relationship status: Widowed  Other Topics Concern  . Not on file  Social History Narrative  . Not on file    Additional Social History: Patient is widowed.  She lives in [place] with her grandson Burr Medico.  Patient had a son who passed away from a drug overdose in 2008.  She has a daughter who lives in [place].  She is on SSD.  Allergies:   Allergies  Allergen Reactions  . Nsaids Other (See Comments)    Because of Crohns Because of Crohns  . Sulfa Antibiotics Rash    Metabolic Disorder Labs: No results found for: HGBA1C, MPG No results found for: PROLACTIN Lab Results  Component Value Date   CHOL 276 (H) 07/29/2017   TRIG 250 (H) 07/29/2017   HDL 42 (L) 07/29/2017   CHOLHDL 6.6 (H) 07/29/2017   LDLCALC 189 (H) 07/29/2017   LDLCALC 121 (H) 05/09/2016      Current Medications: Current Outpatient Medications  Medication Sig Dispense Refill  .  azaTHIOprine (IMURAN) 50 MG tablet Take 3 tablets (150 mg total) by mouth daily. **PLEASE SCHEDULE FOLLOW UP APPT** 90 tablet 3  . clonazePAM (KLONOPIN) 1 MG tablet Take 0.5-1 tablets (0.5-1 mg total) by mouth as directed. Take half tablet at bedtime tuesdays and saturdays and continue 1 tablet rest of the days 30 tablet 5  . cyclobenzaprine (FLEXERIL) 5 MG tablet TAKE 1 TABLET BY MOUTH THREE TIMES A DAY AS NEEDED FOR MUSCLE SPASMS 90 tablet 5  . dicyclomine (BENTYL) 20 MG tablet TAKE 1 TABLET (20 MG TOTAL) BY MOUTH 3 (THREE) TIMES DAILY BEFORE MEALS. 90 tablet 5  . diphenoxylate-atropine (LOMOTIL) 2.5-0.025 MG tablet Take 1 tablet by mouth 4 (four) times daily as needed for diarrhea or loose stools. 120 tablet 5  . doxycycline (VIBRA-TABS) 100 MG tablet Take 1 tablet (100 mg total) by mouth 2 (two) times daily. 20 tablet 0  . fluconazole (DIFLUCAN) 100 MG tablet TAKE 1 TABLET BY MOUTH EVERY DAY 14 tablet 2  . FLUoxetine (PROZAC) 40 MG capsule TAKE 1 CAPSULE BY MOUTH TWICE A DAY 180 capsule 2  . fluticasone (FLONASE) 50 MCG/ACT nasal spray Place 2 sprays into both nostrils daily. 16 g 6  . HYDROcodone-acetaminophen (NORCO) 7.5-325 MG tablet Take 1 tablet by mouth every 6 (six) hours as needed for moderate pain. 120 tablet 0  . lisinopril-hydrochlorothiazide (PRINZIDE,ZESTORETIC) 10-12.5 MG tablet TAKE 1 TABLET BY MOUTH DAILY. 90 tablet 4  . montelukast (SINGULAIR) 10 MG tablet TAKE 1 TABLET BY MOUTH EVERYDAY AT BEDTIME 90 tablet 4  . pantoprazole (PROTONIX) 40 MG tablet TAKE 1 TABLET BY MOUTH TWICE A DAY 60 tablet 6  . pravastatin (PRAVACHOL) 10 MG tablet TAKE 1 TABLET BY MOUTH EVERY DAY 90 tablet 1  . promethazine (PHENERGAN) 25 MG tablet TAKE 1 TABLET BY MOUTH EVERY 6 HOURS AS NEEDED FOR NAUSEA AND VOMITING 30 tablet 2  . promethazine-dextromethorphan (PROMETHAZINE-DM) 6.25-15 MG/5ML syrup Take 5 mLs by  mouth 4 (four) times daily as needed for cough. 118 mL 0  . rOPINIRole (REQUIP) 0.5 MG tablet Take 1/2 tablet at bedtime x 3 days, then 1 at bedtime for 3 days, then increase to 2 at bedtime if needed. 30 tablet 1  . SYMBICORT 80-4.5 MCG/ACT inhaler TAKE 2 PUFFS BY MOUTH TWICE A DAY 10.2 Inhaler 11  . mirtazapine (REMERON) 7.5 MG tablet Take 1 tablet (7.5 mg total) by mouth at bedtime. For sleep 30 tablet 1   No current facility-administered medications for this visit.     Neurologic: Headache: No Seizure: No Paresthesias:No  Musculoskeletal: Strength & Muscle Tone: within normal limits Gait & Station: normal Patient leans: N/A  Psychiatric Specialty Exam: Review of Systems  Musculoskeletal: Positive for back pain and joint pain.  Psychiatric/Behavioral: Positive for depression. The patient is nervous/anxious and has insomnia.   All other systems reviewed and are negative.   Blood pressure 115/71, pulse 85, temperature 97.7 F (36.5 C), temperature source Oral, weight 197 lb (89.4 kg).Body mass index is 30.85 kg/m.  General Appearance: Casual  Eye Contact:  Fair  Speech:  Clear and Coherent  Volume:  Normal  Mood:  Anxious and Depressed  Affect:  Tearful  Thought Process:  Goal Directed and Descriptions of Associations: Intact  Orientation:  Full (Time, Place, and Person)  Thought Content:  Logical  Suicidal Thoughts:  No  Homicidal Thoughts:  No  Memory:  Immediate;   Fair Recent;   Fair Remote;   Fair  Judgement:  Fair  Insight:  Fair  Psychomotor Activity:  Normal  Concentration:  Concentration: Fair and Attention Span: Fair  Recall:  [employer] of Knowledge:Fair  Language: Fair  Akathisia:  No  Handed:  Right  AIMS (if indicated):  na  Assets:  Communication Skills Desire for Improvement Housing Social Support  ADL's:  Intact  Cognition: WNL  Sleep:  poor    Treatment Plan Summary:Angelo is a 62 year old Caucasian female, widowed, on SSD, lives in [place], has a history of depression, bereavement, insomnia, hypertension, Crohn's disease, chronic pain, presented to the clinic today to establish care.  Patient is biologically predisposed given her family history as well as history of trauma.  Patient also has psychosocial stressors of her own health issues.  Patient continues to be grieving the loss of multiple family members.  Patient however is motivated to start psychotherapy sessions as well as continue medications.  Patient did have a history of substance abuse-alcohol abuse currently in remission.  Patient currently denies any suicidality or homicidality and is motivated to stay compliant.  Plan as noted below. Medication management and Plan asnoted below  Plan  MDD PHQ 9 equals 22 Continue Prozac 80 mg p.o. daily in divided dosage. Add mirtazapine 7.5 mg p.o. nightly. Continue CBT.  For bereavement- persistent She will continue grief counseling with our therapist here in clinic.  Insomnia Add mirtazapine 7.5 mg p.o. nightly She is also on Klonopin 1 mg p.o. nightly for restless leg syndrome.  Discussed with patient the risk of being on long-term benzodiazepine therapy.  Discussed with patient to start tapering it off gradually.  Discussed with patient to cut it into half at least 2 nights a week.  She agrees with plan.  I have reviewed [place] controlled substance database.  I have reviewed medical records from her primary medical doctor.  Alcohol abuse moderate in sustained remission She continues to stay sober.  She reports she quit using in 2010.  We will continue to monitor closely.  I have reviewed labs - 07/30/2018 Vitamin b12 - low - she reports it is being replaced.Pending TSH .Will order the same.  Follow-up in clinic in 4 weeks or sooner if needed.  More than 50 % of the time was spent for psychoeducation and supportive psychotherapy and care coordination.  This note was generated in part or whole with voice recognition  software. Voice recognition is usually quite accurate but there are transcription errors that can and very often do occur. I apologize for any typographical errors that were not detected and corrected.     Ursula Alert, MD 11/13/20194:11 PM

## 2018-10-01 ENCOUNTER — Other Ambulatory Visit: Payer: Self-pay | Admitting: Gastroenterology

## 2018-10-01 ENCOUNTER — Other Ambulatory Visit: Payer: Self-pay | Admitting: Family Medicine

## 2018-10-01 DIAGNOSIS — M1711 Unilateral primary osteoarthritis, right knee: Secondary | ICD-10-CM

## 2018-10-01 DIAGNOSIS — G2581 Restless legs syndrome: Secondary | ICD-10-CM

## 2018-10-01 MED ORDER — HYDROCODONE-ACETAMINOPHEN 7.5-325 MG PO TABS: 1.0000 | ORAL_TABLET | Freq: Four times a day (QID) | ORAL | 0 refills | Status: DC | PRN

## 2018-10-02 ENCOUNTER — Other Ambulatory Visit: Payer: Self-pay | Admitting: Family Medicine

## 2018-10-02 DIAGNOSIS — G2581 Restless legs syndrome: Secondary | ICD-10-CM

## 2018-10-12 ENCOUNTER — Ambulatory Visit: Payer: Medicare HMO | Admitting: Licensed Clinical Social Worker

## 2018-10-14 ENCOUNTER — Ambulatory Visit: Payer: Medicare HMO | Admitting: Psychiatry

## 2018-10-26 ENCOUNTER — Ambulatory Visit
Admission: RE | Admit: 2018-10-26 | Discharge: 2018-10-26 | Disposition: A | Discharge status: Home / Self Care | Payer: Medicare HMO | Source: Ambulatory Visit | Attending: Gastroenterology | Admitting: Gastroenterology

## 2018-10-26 DIAGNOSIS — M85832 Other specified disorders of bone density and structure, left forearm: Secondary | ICD-10-CM | POA: Diagnosis not present

## 2018-10-26 DIAGNOSIS — Z780 Asymptomatic menopausal state: Secondary | ICD-10-CM | POA: Diagnosis not present

## 2018-10-26 DIAGNOSIS — K50919 Crohn's disease, unspecified, with unspecified complications: Secondary | ICD-10-CM | POA: Diagnosis not present

## 2018-10-26 DIAGNOSIS — Z7952 Long term (current) use of systemic steroids: Secondary | ICD-10-CM | POA: Insufficient documentation

## 2018-10-26 DIAGNOSIS — M810 Age-related osteoporosis without current pathological fracture: Secondary | ICD-10-CM | POA: Insufficient documentation

## 2018-10-27 ENCOUNTER — Other Ambulatory Visit: Payer: Self-pay | Admitting: Family Medicine

## 2018-10-27 DIAGNOSIS — M1711 Unilateral primary osteoarthritis, right knee: Secondary | ICD-10-CM

## 2018-10-27 MED ORDER — HYDROCODONE-ACETAMINOPHEN 7.5-325 MG PO TABS: 1.0000 | ORAL_TABLET | Freq: Four times a day (QID) | ORAL | 0 refills | Status: DC | PRN

## 2018-10-30 ENCOUNTER — Telehealth: Payer: Self-pay

## 2018-10-30 NOTE — Telephone Encounter (Signed)
Pharmacist called letting Dr. Caryn Section know that hydrocodone was denied by insurance. Also, she wanted you to be aware that the patient is taking clonazepam and hydrocodone together and if that is ok. Please call pharmacy at [phone] to let them know.  Thanks!

## 2018-11-06 ENCOUNTER — Telehealth: Payer: Self-pay

## 2018-11-06 NOTE — Telephone Encounter (Signed)
-----   Message from Lucilla Lame, MD sent at 11/05/2018  7:40 AM EST ----- Let the patient know that her bone density test shows her to have osteoporosis.  She should talk to her primary care provider to optimize her vitamin D and calcium.  She should also have a repeat bone density scan in 2 years.

## 2018-11-06 NOTE — Telephone Encounter (Signed)
MyChart message has been sent to pt regarding bone density results.

## 2018-11-24 ENCOUNTER — Other Ambulatory Visit: Payer: Self-pay | Admitting: Family Medicine

## 2018-11-24 DIAGNOSIS — M1711 Unilateral primary osteoarthritis, right knee: Secondary | ICD-10-CM

## 2018-11-24 MED ORDER — HYDROCODONE-ACETAMINOPHEN 7.5-325 MG PO TABS: 1.0000 | ORAL_TABLET | Freq: Four times a day (QID) | ORAL | 0 refills | Status: DC | PRN

## 2018-11-25 ENCOUNTER — Other Ambulatory Visit: Payer: Self-pay | Admitting: Psychiatry

## 2018-11-29 ENCOUNTER — Other Ambulatory Visit: Payer: Self-pay | Admitting: Gastroenterology

## 2018-11-30 ENCOUNTER — Other Ambulatory Visit: Payer: Self-pay | Admitting: Gastroenterology

## 2018-11-30 DIAGNOSIS — K50919 Crohn's disease, unspecified, with unspecified complications: Secondary | ICD-10-CM

## 2018-12-10 ENCOUNTER — Ambulatory Visit (INDEPENDENT_AMBULATORY_CARE_PROVIDER_SITE_OTHER): Payer: Medicare HMO | Admitting: Physician Assistant

## 2018-12-10 ENCOUNTER — Encounter: Payer: Self-pay | Admitting: Physician Assistant

## 2018-12-10 VITALS — BP 108/68 | HR 85 | Temp 97.7°F | Resp 16 | Wt 193.4 lb

## 2018-12-10 DIAGNOSIS — B029 Zoster without complications: Secondary | ICD-10-CM | POA: Diagnosis not present

## 2018-12-10 MED ORDER — VALACYCLOVIR HCL 1 G PO TABS: 1000.0000 mg | ORAL_TABLET | Freq: Three times a day (TID) | ORAL | 0 refills | Status: AC

## 2018-12-10 NOTE — Progress Notes (Signed)
Patient: Kimberly Hudson Female    DOB: 08-18-1956   63 y.o.   MRN: [id number] Visit Date: 12/10/2018  Today's Provider: Trinna Post, PA-C   Chief Complaint  Patient presents with   Rash   Subjective:     HPI Patient c/o of rash right side waist and back causing pain, itching. Nausea in the morning. Started a few days ago. She reports she experienced pain first and then noticed rash emerge.   Allergies  Allergen Reactions   Nsaids Other (See Comments)    Because of Crohns Because of Crohns   Sulfa Antibiotics Rash     Current Outpatient Medications:    azaTHIOprine (IMURAN) 50 MG tablet, Take 3 tablets (150 mg total) by mouth daily., Disp: 90 tablet, Rfl: 6   clonazePAM (KLONOPIN) 1 MG tablet, Take 0.5-1 tablets (0.5-1 mg total) by mouth as directed. Take half tablet at bedtime tuesdays and saturdays and continue 1 tablet rest of the days, Disp: 30 tablet, Rfl: 5   cyclobenzaprine (FLEXERIL) 5 MG tablet, TAKE 1 TABLET BY MOUTH THREE TIMES A DAY AS NEEDED FOR MUSCLE SPASMS, Disp: 90 tablet, Rfl: 5   dicyclomine (BENTYL) 20 MG tablet, TAKE 1 TABLET (20 MG TOTAL) BY MOUTH 3 (THREE) TIMES DAILY BEFORE MEALS., Disp: 90 tablet, Rfl: 5   diphenoxylate-atropine (LOMOTIL) 2.5-0.025 MG tablet, Take 1 tablet by mouth 4 (four) times daily as needed for diarrhea or loose stools., Disp: 120 tablet, Rfl: 5   doxycycline (VIBRA-TABS) 100 MG tablet, Take 1 tablet (100 mg total) by mouth 2 (two) times daily., Disp: 20 tablet, Rfl: 0   fluconazole (DIFLUCAN) 100 MG tablet, TAKE 1 TABLET BY MOUTH EVERY DAY, Disp: 14 tablet, Rfl: 2   FLUoxetine (PROZAC) 40 MG capsule, TAKE 1 CAPSULE BY MOUTH TWICE A DAY, Disp: 180 capsule, Rfl: 2   fluticasone (FLONASE) 50 MCG/ACT nasal spray, Place 2 sprays into both nostrils daily., Disp: 16 g, Rfl: 6   HYDROcodone-acetaminophen (NORCO) 7.5-325 MG tablet, Take 1 tablet by mouth every 6 (six) hours as needed for moderate pain., Disp: 120  tablet, Rfl: 0   lisinopril-hydrochlorothiazide (PRINZIDE,ZESTORETIC) 10-12.5 MG tablet, TAKE 1 TABLET BY MOUTH DAILY., Disp: 90 tablet, Rfl: 4   mirtazapine (REMERON) 7.5 MG tablet, TAKE 1 TABLET (7.5 MG TOTAL) BY MOUTH AT BEDTIME. FOR SLEEP, Disp: 30 tablet, Rfl: 1   montelukast (SINGULAIR) 10 MG tablet, TAKE 1 TABLET BY MOUTH EVERYDAY AT BEDTIME, Disp: 90 tablet, Rfl: 4   pantoprazole (PROTONIX) 40 MG tablet, TAKE 1 TABLET BY MOUTH TWICE A DAY, Disp: 60 tablet, Rfl: 6   pravastatin (PRAVACHOL) 10 MG tablet, TAKE 1 TABLET BY MOUTH EVERY DAY, Disp: 90 tablet, Rfl: 1   promethazine (PHENERGAN) 25 MG tablet, TAKE 1 TABLET BY MOUTH EVERY 6 HOURS AS NEEDED FOR NAUSEA AND VOMITING, Disp: 30 tablet, Rfl: 2   promethazine-dextromethorphan (PROMETHAZINE-DM) 6.25-15 MG/5ML syrup, Take 5 mLs by mouth 4 (four) times daily as needed for cough., Disp: 118 mL, Rfl: 0   rOPINIRole (REQUIP) 0.5 MG tablet, TAKE 1/2 TAB AT BEDTIME X3 DAYS, THEN 1 AT BEDTIME X3 DAYS, THEN INCREASE TO 2 AT BEDTIME IF NEEDED., Disp: 30 tablet, Rfl: 11   SYMBICORT 80-4.5 MCG/ACT inhaler, TAKE 2 PUFFS BY MOUTH TWICE A DAY, Disp: 10.2 Inhaler, Rfl: 11  Review of Systems  Constitutional: Negative.   Skin: Positive for rash.    Social History   Tobacco Use   Smoking status: Former Smoker  Packs/day: 1.00    Years: 10.00    Pack years: 10.00    Types: Cigarettes    Last attempt to quit: 11/18/2012    Years since quitting: 6.0   Smokeless tobacco: Never Used   Tobacco comment: smoked up to 1 ppd for about 10 years.   Substance Use Topics   Alcohol use: Not Currently    Alcohol/week: 0.0 standard drinks    Comment: every once in a while      Objective:   BP 108/68 (BP Location: Left Arm, Patient Position: Sitting, Cuff Size: Normal)    Pulse 85    Temp 97.7 F (36.5 C) (Oral)    Resp 16    Wt 193 lb 6.4 oz (87.7 kg)    BMI 30.29 kg/m  Vitals:   12/10/18 1601  BP: 108/68  Pulse: 85  Resp: 16  Temp: 97.7  F (36.5 C)  TempSrc: Oral  Weight: 193 lb 6.4 oz (87.7 kg)     Physical Exam Constitutional:      Appearance: Normal appearance.  Skin:    General: Skin is warm and dry.     Findings: Rash present.       Neurological:     General: No focal deficit present.     Mental Status: She is alert and oriented to person, place, and time.  Psychiatric:        Mood and Affect: Mood normal.        Behavior: Behavior normal.         Assessment & Plan    1. Herpes zoster without complication  - valACYclovir (VALTREX) 1000 MG tablet; Take 1 tablet (1,000 mg total) by mouth 3 (three) times daily for 7 days.  Dispense: 21 tablet; Refill: [place], PA-C  [facility]

## 2018-12-10 NOTE — Patient Instructions (Addendum)
Please call insurance company and see where they pay for it   Shingles  Shingles is an infection. It gives you a painful skin rash and blisters that have fluid in them. Shingles is caused by the same germ (virus) that causes chickenpox. Shingles only happens in people who:  Have had chickenpox.  Have been given a shot of medicine (vaccine) to protect against chickenpox. Shingles is rare in this group. The first symptoms of shingles may be itching, tingling, or pain in an area on your skin. A rash will show on your skin a few days or weeks later. The rash is likely to be on one side of your body. The rash usually has a shape like a belt or a band. Over time, the rash turns into fluid-filled blisters. The blisters will break open, change into scabs, and dry up. Medicines may:  Help with pain and itching.  Help you get better sooner.  Help to prevent long-term problems. Follow these instructions at home: Medicines  Take over-the-counter and prescription medicines only as told by your doctor.  Put on an anti-itch cream or numbing cream where you have a rash, blisters, or scabs. Do this as told by your doctor. Helping with itching and discomfort   Put cold, wet cloths (cold compresses) on the area of the rash or blisters as told by your doctor.  Cool baths can help you feel better. Try adding baking soda or dry oatmeal to the water to lessen itching. Do not bathe in hot water. Blister and rash care  Keep your rash covered with a loose bandage (dressing).  Wear loose clothing that does not rub on your rash.  Keep your rash and blisters clean. To do this, wash the area with mild soap and cool water as told by your doctor.  Check your rash every day for signs of infection. Check for: ? More redness, swelling, or pain. ? Fluid or blood. ? Warmth. ? Pus or a bad smell.  Do not scratch your rash. Do not pick at your blisters. To help you to not scratch: ? Keep your fingernails clean  and cut short. ? Wear gloves or mittens when you sleep, if scratching is a problem. General instructions  Rest as told by your doctor.  Keep all follow-up visits as told by your doctor. This is important.  Wash your hands often with soap and water. If soap and water are not available, use hand sanitizer. Doing this lowers your chance of getting a skin infection caused by germs (bacteria).  Your infection can cause chickenpox in people who have never had chickenpox or never got a shot of chickenpox vaccine. If you have blisters that did not change into scabs yet, try not to touch other people or be around other people, especially: ? Babies. ? Pregnant women. ? Children who have areas of red, itchy, or rough skin (eczema). ? Very old people who have transplants. ? People who have a long-term (chronic) sickness, like cancer or AIDS. Contact a doctor if:  Your pain does not get better with medicine.  Your pain does not get better after the rash heals.  You have any signs of infection in the rash area. These signs include: ? More redness, swelling, or pain around the rash. ? Fluid or blood coming from the rash. ? The rash area feeling warm to the touch. ? Pus or a bad smell coming from the rash. Get help right away if:  The rash is on your  face or nose.  You have pain in your face or pain by your eye.  You lose feeling on one side of your face.  You have trouble seeing.  You have ear pain, or you have ringing in your ear.  You have a loss of taste.  Your condition gets worse. Summary  Shingles gives you a painful skin rash and blisters that have fluid in them.  Shingles is an infection. It is caused by the same germ (virus) that causes chickenpox.  Keep your rash covered with a loose bandage (dressing). Wear loose clothing that does not rub on your rash.  If you have blisters that did not change into scabs yet, try not to touch other people or be around people. This  information is not intended to replace advice given to you by your health care provider. Make sure you discuss any questions you have with your health care provider. Document Released: 04/22/2008 Document Revised: 07/09/2017 Document Reviewed: 07/09/2017 Elsevier Interactive Patient Education  2019 [employer].

## 2018-12-11 ENCOUNTER — Ambulatory Visit: Payer: Self-pay | Admitting: Family Medicine

## 2018-12-15 ENCOUNTER — Telehealth: Payer: Self-pay | Admitting: Family Medicine

## 2018-12-15 NOTE — Telephone Encounter (Signed)
Her lungs were normal on exam and she did not have a fever at the time of her visit. It's probably not related to the shingles, she may have come down with something in the interim. If she has a cough she can take delsym, push fluids. It looks like she has phenergan for nausea. If she is getting worse she should come back in to be re-evaluated.

## 2018-12-15 NOTE — Telephone Encounter (Signed)
Feeling dehydrated - dry mouth achy cough nauseous Fatigued  Pt not sure if this is what goes along with the shingles.  Needing some advice on how to feel better asap.  Thanks, [employer]

## 2018-12-15 NOTE — Telephone Encounter (Signed)
Patient was advised and states she will come in if not feeling any better soon.

## 2018-12-20 ENCOUNTER — Other Ambulatory Visit: Payer: Self-pay | Admitting: Family Medicine

## 2018-12-20 DIAGNOSIS — S39012A Strain of muscle, fascia and tendon of lower back, initial encounter: Secondary | ICD-10-CM

## 2018-12-21 ENCOUNTER — Other Ambulatory Visit: Payer: Self-pay | Admitting: Family Medicine

## 2018-12-21 DIAGNOSIS — M1711 Unilateral primary osteoarthritis, right knee: Secondary | ICD-10-CM

## 2018-12-21 MED ORDER — HYDROCODONE-ACETAMINOPHEN 7.5-325 MG PO TABS: 1.0000 | ORAL_TABLET | Freq: Four times a day (QID) | ORAL | 0 refills | Status: DC | PRN

## 2019-01-01 ENCOUNTER — Other Ambulatory Visit: Payer: Self-pay | Admitting: Gastroenterology

## 2019-01-04 ENCOUNTER — Ambulatory Visit: Payer: Medicare HMO | Admitting: Family Medicine

## 2019-01-14 ENCOUNTER — Other Ambulatory Visit: Payer: Self-pay | Admitting: Family Medicine

## 2019-01-14 DIAGNOSIS — M1711 Unilateral primary osteoarthritis, right knee: Secondary | ICD-10-CM

## 2019-01-15 MED ORDER — HYDROCODONE-ACETAMINOPHEN 7.5-325 MG PO TABS: 1.0000 | ORAL_TABLET | Freq: Four times a day (QID) | ORAL | 0 refills | Status: DC | PRN

## 2019-01-18 ENCOUNTER — Other Ambulatory Visit: Payer: Self-pay | Admitting: Family Medicine

## 2019-01-19 ENCOUNTER — Telehealth: Payer: Disability Insurance | Admitting: Nurse Practitioner

## 2019-01-19 DIAGNOSIS — R6889 Other general symptoms and signs: Secondary | ICD-10-CM

## 2019-01-19 MED ORDER — OSELTAMIVIR PHOSPHATE 75 MG PO CAPS: 75.0000 mg | ORAL_CAPSULE | Freq: Two times a day (BID) | ORAL | 0 refills | Status: DC

## 2019-01-19 NOTE — Progress Notes (Signed)
E visit for Flu like symptoms   We are sorry that you are not feeling well.  Here is how we plan to help! Based on what you have shared with me it looks like you may have possible exposure to a virus that causes influenza.  Influenza or "the flu" is   an infection caused by a respiratory virus. The flu virus is highly contagious and persons who did not receive their yearly flu vaccination may "catch" the flu from close contact.  We have anti-viral medications to treat the viruses that cause this infection. They are not a "cure" and only shorten the course of the infection. These prescriptions are most effective when they are given within the first 2 days of "flu" symptoms. Antiviral medication are indicated if you have a high risk of complications from the flu. You should  also consider an antiviral medication if you are in close contact with someone who is at risk. These medications can help patients avoid complications from the flu  but have side effects that you should know. Possible side effects from Tamiflu or oseltamivir include nausea, vomiting, diarrhea, dizziness, headaches, eye redness, sleep problems or other respiratory symptoms. You should not take Tamiflu if you have an allergy to oseltamivir or any to the ingredients in Tamiflu.  Based upon your symptoms and potential risk factors I have prescribed Oseltamivir (Tamiflu).  It has been sent to your designated pharmacy.  You will take one 75 mg capsule orally twice a day for the next 5 days.  ANYONE WHO HAS FLU SYMPTOMS SHOULD: . Stay home. The flu is highly contagious and going out or to work exposes others! . Be sure to drink plenty of fluids. Water is fine as well as fruit juices, sodas and electrolyte beverages. You may want to stay away from caffeine or alcohol. If you are nauseated, try taking small sips of liquids. How do you know if you are getting enough fluid? Your urine should be a pale yellow or almost colorless. . Get  rest. . Taking a steamy shower or using a humidifier may help nasal congestion and ease sore throat pain. Using a saline nasal spray works much the same way. . Cough drops, hard candies and sore throat lozenges may ease your cough. . Line up a caregiver. Have someone check on you regularly.   GET HELP RIGHT AWAY IF: . You cannot keep down liquids or your medications. . You become short of breath . Your fell like you are going to pass out or loose consciousness. . Your symptoms persist after you have completed your treatment plan MAKE SURE YOU   Understand these instructions.  Will watch your condition.  Will get help right away if you are not doing well or get worse.  Your e-visit answers were reviewed by a board certified advanced clinical practitioner to complete your personal care plan.  Depending on the condition, your plan could have included both over the counter or prescription medications.  If there is a problem please reply  once you have received a response from your provider.  Your safety is important to [place].  If you have drug allergies check your prescription carefully.    You can use MyChart to ask questions about today's visit, request a non-urgent call back, or ask for a work or school excuse for 24 hours related to this e-Visit. If it has been greater than 24 hours you will need to follow up with your provider, or enter a new  e-Visit to address those concerns.  You will get an e-mail in the next two days asking about your experience.  I hope that your e-visit has been valuable and will speed your recovery. Thank you for using e-visits.  5 minutes spent reviewing and documenting in chart.

## 2019-01-27 ENCOUNTER — Inpatient Hospital Stay
Admission: EM | Admit: 2019-01-27 | Discharge: 2019-02-06 | DRG: 481 | Disposition: A | Discharge status: Home Health | Payer: Medicare HMO | Attending: Internal Medicine | Admitting: Internal Medicine

## 2019-01-27 ENCOUNTER — Emergency Department: Payer: Medicare HMO

## 2019-01-27 ENCOUNTER — Other Ambulatory Visit: Payer: Self-pay

## 2019-01-27 DIAGNOSIS — D62 Acute posthemorrhagic anemia: Secondary | ICD-10-CM | POA: Diagnosis not present

## 2019-01-27 DIAGNOSIS — Z419 Encounter for procedure for purposes other than remedying health state, unspecified: Secondary | ICD-10-CM

## 2019-01-27 DIAGNOSIS — K5000 Crohn's disease of small intestine without complications: Secondary | ICD-10-CM | POA: Diagnosis present

## 2019-01-27 DIAGNOSIS — S8991XA Unspecified injury of right lower leg, initial encounter: Secondary | ICD-10-CM | POA: Diagnosis not present

## 2019-01-27 DIAGNOSIS — F329 Major depressive disorder, single episode, unspecified: Secondary | ICD-10-CM | POA: Diagnosis present

## 2019-01-27 DIAGNOSIS — Z87891 Personal history of nicotine dependence: Secondary | ICD-10-CM

## 2019-01-27 DIAGNOSIS — E669 Obesity, unspecified: Secondary | ICD-10-CM | POA: Diagnosis present

## 2019-01-27 DIAGNOSIS — I2510 Atherosclerotic heart disease of native coronary artery without angina pectoris: Secondary | ICD-10-CM | POA: Diagnosis present

## 2019-01-27 DIAGNOSIS — I10 Essential (primary) hypertension: Secondary | ICD-10-CM | POA: Diagnosis present

## 2019-01-27 DIAGNOSIS — S72141A Displaced intertrochanteric fracture of right femur, initial encounter for closed fracture: Principal | ICD-10-CM | POA: Diagnosis present

## 2019-01-27 DIAGNOSIS — Y9301 Activity, walking, marching and hiking: Secondary | ICD-10-CM | POA: Diagnosis present

## 2019-01-27 DIAGNOSIS — D72819 Decreased white blood cell count, unspecified: Secondary | ICD-10-CM | POA: Diagnosis present

## 2019-01-27 DIAGNOSIS — W19XXXA Unspecified fall, initial encounter: Secondary | ICD-10-CM | POA: Diagnosis not present

## 2019-01-27 DIAGNOSIS — E538 Deficiency of other specified B group vitamins: Secondary | ICD-10-CM | POA: Diagnosis present

## 2019-01-27 DIAGNOSIS — R52 Pain, unspecified: Secondary | ICD-10-CM | POA: Diagnosis not present

## 2019-01-27 DIAGNOSIS — I959 Hypotension, unspecified: Secondary | ICD-10-CM | POA: Diagnosis not present

## 2019-01-27 DIAGNOSIS — E8809 Other disorders of plasma-protein metabolism, not elsewhere classified: Secondary | ICD-10-CM | POA: Diagnosis not present

## 2019-01-27 DIAGNOSIS — K219 Gastro-esophageal reflux disease without esophagitis: Secondary | ICD-10-CM | POA: Diagnosis present

## 2019-01-27 DIAGNOSIS — S72145A Nondisplaced intertrochanteric fracture of left femur, initial encounter for closed fracture: Secondary | ICD-10-CM

## 2019-01-27 DIAGNOSIS — E878 Other disorders of electrolyte and fluid balance, not elsewhere classified: Secondary | ICD-10-CM | POA: Diagnosis present

## 2019-01-27 DIAGNOSIS — Z79899 Other long term (current) drug therapy: Secondary | ICD-10-CM

## 2019-01-27 DIAGNOSIS — Z6830 Body mass index (BMI) 30.0-30.9, adult: Secondary | ICD-10-CM

## 2019-01-27 DIAGNOSIS — F32A Depression, unspecified: Secondary | ICD-10-CM

## 2019-01-27 DIAGNOSIS — F419 Anxiety disorder, unspecified: Secondary | ICD-10-CM | POA: Diagnosis present

## 2019-01-27 DIAGNOSIS — E876 Hypokalemia: Secondary | ICD-10-CM | POA: Diagnosis present

## 2019-01-27 DIAGNOSIS — Z85828 Personal history of other malignant neoplasm of skin: Secondary | ICD-10-CM

## 2019-01-27 DIAGNOSIS — S299XXA Unspecified injury of thorax, initial encounter: Secondary | ICD-10-CM | POA: Diagnosis not present

## 2019-01-27 DIAGNOSIS — M810 Age-related osteoporosis without current pathological fracture: Secondary | ICD-10-CM | POA: Diagnosis present

## 2019-01-27 DIAGNOSIS — E778 Other disorders of glycoprotein metabolism: Secondary | ICD-10-CM | POA: Diagnosis present

## 2019-01-27 DIAGNOSIS — Y92010 Kitchen of single-family (private) house as the place of occurrence of the external cause: Secondary | ICD-10-CM

## 2019-01-27 DIAGNOSIS — M25551 Pain in right hip: Secondary | ICD-10-CM | POA: Diagnosis not present

## 2019-01-27 DIAGNOSIS — E785 Hyperlipidemia, unspecified: Secondary | ICD-10-CM | POA: Diagnosis present

## 2019-01-27 DIAGNOSIS — Z7951 Long term (current) use of inhaled steroids: Secondary | ICD-10-CM

## 2019-01-27 DIAGNOSIS — S72041A Displaced fracture of base of neck of right femur, initial encounter for closed fracture: Secondary | ICD-10-CM | POA: Diagnosis not present

## 2019-01-27 DIAGNOSIS — W010XXA Fall on same level from slipping, tripping and stumbling without subsequent striking against object, initial encounter: Secondary | ICD-10-CM | POA: Diagnosis present

## 2019-01-27 DIAGNOSIS — R079 Chest pain, unspecified: Secondary | ICD-10-CM | POA: Diagnosis not present

## 2019-01-27 DIAGNOSIS — E559 Vitamin D deficiency, unspecified: Secondary | ICD-10-CM | POA: Diagnosis present

## 2019-01-27 DIAGNOSIS — D509 Iron deficiency anemia, unspecified: Secondary | ICD-10-CM | POA: Diagnosis present

## 2019-01-27 LAB — CBC WITH DIFFERENTIAL/PLATELET
Abs Immature Granulocytes: 0.03 10*3/uL (ref 0.00–0.07)
Basophils Absolute: 0 10*3/uL (ref 0.0–0.1)
Basophils Relative: 1 %
Eosinophils Absolute: 0.2 10*3/uL (ref 0.0–0.5)
Eosinophils Relative: 5 %
HCT: 26.9 % — ABNORMAL LOW (ref 36.0–46.0)
Hemoglobin: 9.2 g/dL — ABNORMAL LOW (ref 12.0–15.0)
Immature Granulocytes: 1 %
Lymphocytes Relative: 14 %
Lymphs Abs: 0.5 10*3/uL — ABNORMAL LOW (ref 0.7–4.0)
MCH: 38.7 pg — ABNORMAL HIGH (ref 26.0–34.0)
MCHC: 34.2 g/dL (ref 30.0–36.0)
MCV: 113 fL — ABNORMAL HIGH (ref 80.0–100.0)
Monocytes Absolute: 0.2 10*3/uL (ref 0.1–1.0)
Monocytes Relative: 6 %
Neutro Abs: 2.5 10*3/uL (ref 1.7–7.7)
Neutrophils Relative %: 73 %
Platelets: 290 10*3/uL (ref 150–400)
RBC: 2.38 MIL/uL — AB (ref 3.87–5.11)
RDW: 19.1 % — ABNORMAL HIGH (ref 11.5–15.5)
WBC: 3.4 10*3/uL — ABNORMAL LOW (ref 4.0–10.5)
nRBC: 1.7 % — ABNORMAL HIGH (ref 0.0–0.2)

## 2019-01-27 LAB — COMPREHENSIVE METABOLIC PANEL
ALT: 18 U/L (ref 0–44)
ANION GAP: 14 (ref 5–15)
AST: 48 U/L — ABNORMAL HIGH (ref 15–41)
Albumin: 2.3 g/dL — ABNORMAL LOW (ref 3.5–5.0)
Alkaline Phosphatase: 154 U/L — ABNORMAL HIGH (ref 38–126)
BUN: 9 mg/dL (ref 8–23)
CO2: 27 mmol/L (ref 22–32)
Calcium: 7.6 mg/dL — ABNORMAL LOW (ref 8.9–10.3)
Chloride: 96 mmol/L — ABNORMAL LOW (ref 98–111)
Creatinine, Ser: 0.92 mg/dL (ref 0.44–1.00)
GFR calc Af Amer: >60 mL/min (ref >60)
GFR calc non Af Amer: >60 mL/min (ref >60)
Glucose, Bld: 122 mg/dL — ABNORMAL HIGH (ref 70–99)
Potassium: <2 mmol/L — CL (ref 3.5–5.1)
Sodium: 137 mmol/L (ref 135–145)
Total Bilirubin: 1 mg/dL (ref 0.3–1.2)
Total Protein: 5.8 g/dL — ABNORMAL LOW (ref 6.5–8.1)

## 2019-01-27 MED ORDER — POTASSIUM CHLORIDE 10 MEQ/100ML IV SOLN
10.0000 meq | INTRAVENOUS | Status: AC
  Administered 2019-01-28 (×4): 10 meq via INTRAVENOUS
  Filled 2019-01-27 (×4): qty 100

## 2019-01-27 MED ORDER — ONDANSETRON HCL 4 MG/2ML IJ SOLN
4.0000 mg | Freq: Once | INTRAMUSCULAR | Status: AC
  Administered 2019-01-27: 4 mg via INTRAVENOUS
  Filled 2019-01-27: qty 2

## 2019-01-27 MED ORDER — MAGNESIUM SULFATE 2 GM/50ML IV SOLN
2.0000 g | Freq: Once | INTRAVENOUS | Status: AC
  Administered 2019-01-28: 2 g via INTRAVENOUS
  Filled 2019-01-27: qty 50

## 2019-01-27 MED ORDER — MORPHINE SULFATE (PF) 4 MG/ML IV SOLN
4.0000 mg | Freq: Once | INTRAVENOUS | Status: AC
  Administered 2019-01-27: 4 mg via INTRAVENOUS
  Filled 2019-01-27: qty 1

## 2019-01-27 NOTE — ED Triage Notes (Signed)
Patient arrived from home by [facility]. Patient had fall at home. Patient's right leg is turned out and is shorter than left leg. Patient is complaining of 10/10 pain. Per EMS patient received 180mg of Fentanyl for pain prior to arrival. Patient states she walks with a cane and was not using it like she usually does. Patient as IV access in left AC.

## 2019-01-27 NOTE — ED Notes (Signed)
Patient transferred to Xray

## 2019-01-27 NOTE — ED Provider Notes (Signed)
[facility] Emergency Department Provider Note    First MD Initiated Contact with Patient 01/27/19 2301     (approximate)  I have reviewed the triage vital signs and the nursing notes.   HISTORY  Chief Complaint Fall    HPI Kimberly Hudson is a 63 y.o. female the below listed past medical history on immunotherapy for history of Crohn's presents the ER for evaluation of acute onset of right hip pain that occurred after the patient had a fall tonight in her kitchen.  Is uncertain if she slipped on wet linoleum floor where she tripped on something but landed on her right hip.  Did not hit her head.  States the pain is moderate to severe.  Arrives via EMS with shortened and externally rotated right leg.  States that she typically walks with a cane and has been having difficulty with mobility over the past several days.    Past Medical History:  Diagnosis Date  . Anemia    past hx  . Anxiety   . Arthritis    osteo - shoulders, hips, knees  . Complication of anesthesia    propofol causes headaches  . Coronary artery disease   . Crohn's disease ([place])   . Depression   . GERD (gastroesophageal reflux disease)   . History of chicken pox   . History of measles   . History of mumps   . Hypertension   . 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 Wears dentures    full upper   Family History  Problem Relation Age of Onset  . COPD Mother   . Heart disease Mother   . COPD Father   . Emphysema Father   . Prostate cancer Father   . Alcohol abuse Father   . Anxiety disorder Father   . Depression Father   . Schizophrenia Father   . COPD Sister   . Heart disease Sister   . Alcohol abuse Sister   . Anxiety disorder Sister   . Depression Sister   . Alcohol abuse Brother   . Alcohol abuse Sister   . Anxiety disorder Sister   . Depression Sister   . Alcohol abuse Sister   . Anxiety disorder Sister   . Depression Sister   . Anxiety disorder  Sister   . Depression Sister   . Anxiety disorder Sister   . Depression Sister   . Drug abuse Son   . Drug abuse Other   . Drug abuse Other   . Drug abuse Other    Past Surgical History:  Procedure Laterality Date  . COLONOSCOPY  02/2013   No polyps were found  . ESOPHAGEAL DILATION  05/29/2017   Procedure: ESOPHAGEAL DILATION;  Surgeon: Lucilla Lame, MD;  Location: [place];  Service: Endoscopy;;  . ESOPHAGOGASTRODUODENOSCOPY (EGD) WITH PROPOFOL N/A 06/28/2015   Procedure: ESOPHAGOGASTRODUODENOSCOPY (EGD) WITH PROPOFOL;  Surgeon: Lucilla Lame, MD;  Location: [place];  Service: Endoscopy;  Laterality: N/A;  with dialation  . ESOPHAGOGASTRODUODENOSCOPY (EGD) WITH PROPOFOL N/A 07/26/2015   Procedure: ESOPHAGOGASTRODUODENOSCOPY (EGD) WITH PROPOFOL, with dialation;  Surgeon: Lucilla Lame, MD;  Location: [place];  Service: Endoscopy;  Laterality: N/A;  LEAVE PT LAST  . ESOPHAGOGASTRODUODENOSCOPY (EGD) WITH PROPOFOL N/A 08/05/2016   Procedure: ESOPHAGOGASTRODUODENOSCOPY (EGD) WITH PROPOFOL;  Surgeon: Lucilla Lame, MD;  Location: [place];  Service: Endoscopy;  Laterality: N/A;  . ESOPHAGOGASTRODUODENOSCOPY (EGD) WITH PROPOFOL N/A 09/26/2016   Procedure: ESOPHAGOGASTRODUODENOSCOPY (EGD) WITH PROPOFOL;  Surgeon:  Lucilla Lame, MD;  Location: [place];  Service: Endoscopy;  Laterality: N/A;  . ESOPHAGOGASTRODUODENOSCOPY (EGD) WITH PROPOFOL N/A 05/29/2017   Procedure: ESOPHAGOGASTRODUODENOSCOPY (EGD) WITH PROPOFOL - Injection of steroid in esophagus;  Surgeon: Lucilla Lame, MD;  Location: [place];  Service: Endoscopy;  Laterality: N/A;  . Excision BCC  07/28/2013   right nares, Georgiann Cocker  . TUBAL LIGATION  1986  . UPPER GI ENDOSCOPY  02/2014   Patient Active Problem List   Diagnosis Date Noted  . Closed intertrochanteric fracture of hip, right, initial encounter ([place]) 01/28/2019  . Anxiety 04/29/2018  . Abnormal lung function test  04/29/2018  . Chondromalacia patellae 05/08/2017  . Low back strain 01/22/2017  . Low back pain 01/22/2017  . Problems with swallowing and mastication   . Immunosuppression ([place]) 01/08/2016  . Anemia 08/28/2015  . Depression 08/28/2015  . History of basal cell cancer 08/28/2015  . Hyperlipidemia 08/28/2015  . Hypertension 08/28/2015  . Hypokalemia 08/28/2015  . Localized osteoarthrosis of left shoulder region 08/28/2015  . Osteoarthritis of knee 08/28/2015  . Reflux 08/28/2015  . Restless leg syndrome 08/28/2015  . Insomnia 08/28/2015  . Stricture and stenosis of esophagus   . Candida esophagitis ([place])   . Gastritis   . CD (Crohn's disease) ([place]) 06/19/2015  . 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 Severe cervical dysplasia 08/12/2013      Prior to Admission medications   Medication Sig Start Date End Date Taking? Authorizing Provider  azaTHIOprine (IMURAN) 50 MG tablet Take 3 tablets (150 mg total) by mouth daily. 12/03/18   Lucilla Lame, MD  clonazePAM (KLONOPIN) 1 MG tablet Take 0.5-1 tablets (0.5-1 mg total) by mouth as directed. Take half tablet at bedtime tuesdays and saturdays and continue 1 tablet rest of the days 09/30/18   Ursula Alert, MD  cyclobenzaprine (FLEXERIL) 5 MG tablet TAKE 1 TABLET BY MOUTH THREE TIMES A DAY AS NEEDED FOR MUSCLE SPASMS 12/20/18   Birdie Sons, MD  dicyclomine (BENTYL) 20 MG tablet TAKE 1 TABLET (20 MG TOTAL) BY MOUTH 3 (THREE) TIMES DAILY BEFORE MEALS. 12/19/17   Lucilla Lame, MD  diphenoxylate-atropine (LOMOTIL) 2.5-0.025 MG tablet Take 1 tablet by mouth 4 (four) times daily as needed for diarrhea or loose stools. 05/30/17   Lucilla Lame, MD  doxycycline (VIBRA-TABS) 100 MG tablet Take 1 tablet (100 mg total) by mouth 2 (two) times daily. 09/26/18   Jerrol Banana., MD  fluconazole (DIFLUCAN) 100 MG tablet TAKE 1 TABLET BY MOUTH EVERY DAY 01/18/19   Birdie Sons, MD  FLUoxetine (PROZAC) 40 MG capsule TAKE 1  CAPSULE BY MOUTH TWICE A DAY 05/21/18   Birdie Sons, MD  fluticasone (FLONASE) 50 MCG/ACT nasal spray Place 2 sprays into both nostrils daily. 02/16/18   Birdie Sons, MD  HYDROcodone-acetaminophen (NORCO) 7.5-325 MG tablet Take 1 tablet by mouth every 6 (six) hours as needed for moderate pain. 01/15/19   Birdie Sons, MD  lisinopril-hydrochlorothiazide (PRINZIDE,ZESTORETIC) 10-12.5 MG tablet TAKE 1 TABLET BY MOUTH DAILY. 12/29/17   Birdie Sons, MD  mirtazapine (REMERON) 7.5 MG tablet TAKE 1 TABLET (7.5 MG TOTAL) BY MOUTH AT BEDTIME. FOR SLEEP 11/25/18   Ursula Alert, MD  montelukast (SINGULAIR) 10 MG tablet TAKE 1 TABLET BY MOUTH EVERYDAY AT BEDTIME 08/21/18   Birdie Sons, MD  oseltamivir (TAMIFLU) 75 MG capsule Take 1 capsule (75 mg total) by mouth 2 (two) times daily. 01/19/19   Chevis Pretty, [place]  pantoprazole (PROTONIX) 40 MG tablet TAKE 1 TABLET BY MOUTH TWICE A DAY 01/05/19   Lucilla Lame, MD  pravastatin (PRAVACHOL) 10 MG tablet TAKE 1 TABLET BY MOUTH EVERY DAY 01/18/19   Birdie Sons, MD  promethazine (PHENERGAN) 25 MG tablet TAKE 1 TABLET BY MOUTH EVERY 6 HOURS AS NEEDED FOR NAUSEA AND VOMITING 12/03/18   Lucilla Lame, MD  promethazine-dextromethorphan (PROMETHAZINE-DM) 6.25-15 MG/5ML syrup Take 5 mLs by mouth 4 (four) times daily as needed for cough. 09/26/18   Jerrol Banana., MD  rOPINIRole (REQUIP) 0.5 MG tablet TAKE 1/2 TAB AT BEDTIME X3 DAYS, THEN 1 AT BEDTIME X3 DAYS, THEN INCREASE TO 2 AT BEDTIME IF NEEDED. 10/02/18   Birdie Sons, MD  SYMBICORT 80-4.5 MCG/ACT inhaler TAKE 2 PUFFS BY MOUTH TWICE A DAY 04/27/18   Birdie Sons, MD    Allergies Nsaids and Sulfa antibiotics    Social History Social History   Tobacco Use  . Smoking status: Former Smoker    Packs/day: 1.00    Years: 10.00    Pack years: 10.00    Types: Cigarettes    Last attempt to quit: 11/18/2012    Years since quitting: 6.1  . Smokeless tobacco: Never Used  . Tobacco  comment: smoked up to 1 ppd for about 10 years.   Substance Use Topics  . Alcohol use: Not Currently    Alcohol/week: 0.0 standard drinks    Comment: every once in a while  . Drug use: No    Review of Systems Patient denies headaches, rhinorrhea, blurry vision, numbness, shortness of breath, chest pain, edema, cough, abdominal pain, nausea, vomiting, diarrhea, dysuria, fevers, rashes or hallucinations unless otherwise stated above in HPI. ____________________________________________   PHYSICAL EXAM:  VITAL SIGNS: Vitals:   01/27/19 2238 01/27/19 2300  BP: 102/65 109/60  Pulse: 74 78  Resp: 18 15  Temp: 98.2 F (36.8 C)   SpO2: 99% 100%    Constitutional: Alert and oriented. Uncomfortable appearing Eyes: Conjunctivae are normal.  Head: Atraumatic. Nose: No congestion/rhinnorhea. Mouth/Throat: Mucous membranes are moist.   Neck: No stridor. Painless ROM.  Cardiovascular: Normal rate, regular rhythm. Grossly normal heart sounds.  Good peripheral circulation. Respiratory: Normal respiratory effort.  No retractions. Lungs CTAB. Gastrointestinal: Soft and nontender. No distention. No abdominal bruits. No CVA tenderness. Genitourinary: deferred Musculoskeletal: rle shortened and externally rotated.  N/V intact distally.  Thigh compartment soft. Neurologic:  Normal speech and language. No gross focal neurologic deficits are appreciated. No facial droop Skin:  Skin is warm, dry and intact. No rash noted. Psychiatric: Mood and affect are normal. Speech and behavior are normal.  ____________________________________________   LABS (all labs ordered are listed, but only abnormal results are displayed)  Results for orders placed or performed during the hospital encounter of 01/27/19 (from the past 24 hour(s))  CBC with Differential/Platelet     Status: Abnormal   Collection Time: 01/27/19 10:47 PM  Result Value Ref Range   WBC 3.4 (L) 4.0 - 10.5 K/uL   RBC 2.38 (L) 3.87 - 5.11  MIL/uL   Hemoglobin 9.2 (L) 12.0 - 15.0 g/dL   HCT 26.9 (L) 36.0 - 46.0 %   MCV 113.0 (H) 80.0 - 100.0 fL   MCH 38.7 (H) 26.0 - 34.0 pg   MCHC 34.2 30.0 - 36.0 g/dL   RDW 19.1 (H) 11.5 - 15.5 %   Platelets 290 150 - 400 K/uL   nRBC 1.7 (H) 0.0 - 0.2 %  Neutrophils Relative % 73 %   Neutro Abs 2.5 1.7 - 7.7 K/uL   Lymphocytes Relative 14 %   Lymphs Abs 0.5 (L) 0.7 - 4.0 K/uL   Monocytes Relative 6 %   Monocytes Absolute 0.2 0.1 - 1.0 K/uL   Eosinophils Relative 5 %   Eosinophils Absolute 0.2 0.0 - 0.5 K/uL   Basophils Relative 1 %   Basophils Absolute 0.0 0.0 - 0.1 K/uL   Immature Granulocytes 1 %   Abs Immature Granulocytes 0.03 0.00 - 3.66 K/uL   Basophilic Stippling PRESENT   Comprehensive metabolic panel     Status: Abnormal   Collection Time: 01/27/19 10:47 PM  Result Value Ref Range   Sodium 137 135 - 145 mmol/L   Potassium <2.0 (LL) 3.5 - 5.1 mmol/L   Chloride 96 (L) 98 - 111 mmol/L   CO2 27 22 - 32 mmol/L   Glucose, Bld 122 (H) 70 - 99 mg/dL   BUN 9 8 - 23 mg/dL   Creatinine, Ser 0.92 0.44 - 1.00 mg/dL   Calcium 7.6 (L) 8.9 - 10.3 mg/dL   Total Protein 5.8 (L) 6.5 - 8.1 g/dL   Albumin 2.3 (L) 3.5 - 5.0 g/dL   AST 48 (H) 15 - 41 U/L   ALT 18 0 - 44 U/L   Alkaline Phosphatase 154 (H) 38 - 126 U/L   Total Bilirubin 1.0 0.3 - 1.2 mg/dL   GFR calc non Af Amer >60 >60 mL/min   GFR calc Af Amer >60 >60 mL/min   Anion gap 14 5 - 15   ____________________________________________  EKG My review and personal interpretation at Time: 22:32   Indication: weakness  Rate: 75  Rhythm: sinus Axis: normal Other: prolonged qt, nonspecific st depressions,  ____________________________________________  RADIOLOGY  I personally reviewed all radiographic images ordered to evaluate for the above acute complaints and reviewed radiology reports and findings.  These findings were personally discussed with the patient.  Please see medical record for radiology report.   ____________________________________________   PROCEDURES  Procedure(s) performed:  .Critical Care Performed by: Merlyn Lot, MD Authorized by: Merlyn Lot, MD   Critical care provider statement:    Critical care time (minutes):  30   Critical care time was exclusive of:  Separately billable procedures and treating other patients   Critical care was necessary to treat or prevent imminent or life-threatening deterioration of the following conditions:  Metabolic crisis   Critical care was time spent personally by me on the following activities:  Development of treatment plan with patient or surrogate, discussions with consultants, evaluation of patient's response to treatment, examination of patient, obtaining history from patient or surrogate, ordering and performing treatments and interventions, ordering and review of laboratory studies, ordering and review of radiographic studies, pulse oximetry, re-evaluation of patient's condition and review of old charts      Critical Care performed: yes ____________________________________________   INITIAL IMPRESSION / ASSESSMENT AND PLAN / ED COURSE  Pertinent labs & imaging results that were available during my care of the patient were reviewed by me and considered in my medical decision making (see chart for details).   DDX: fracture, dislocation, contusion  RESHA FILIPPONE is a 63 y.o. who presents to the ED with right hip pain after mechanical fall as described above.  Blood work recent for the but work-up as I am concerned for hip fracture versus dislocation.  Will give IV pain medication as well as IV hydration.  Patient not currently  on any antiplatelets or anticoagulation.    Clinical Course as of Jan 28 31  Thu Jan 28, 2019  0010 Made multiple pages to orthopedics has not returned phone call.  Given undetectable potassium will admit to the hospital for runs of potassium as well as IV magnesium.   [PR]  0026 Discussed  case with Dr. Posey Pronto of orthopedics.  Patient remains hemodynamically stable.   [PR]    Clinical Course User Index [PR] Merlyn Lot, MD     As part of my medical decision making, I reviewed the following data within the [facility] notes reviewed and incorporated, Labs reviewed, notes from prior ED visits and [place] Controlled Substance Database   ____________________________________________   FINAL CLINICAL IMPRESSION(S) / ED DIAGNOSES  Final diagnoses:  Hypokalemia  Closed nondisplaced intertrochanteric fracture of left femur, initial encounter ([place])      NEW MEDICATIONS STARTED DURING THIS VISIT:  New Prescriptions   No medications on file     Note:  This document was prepared using Dragon voice recognition software and may include unintentional dictation errors.Merlyn Lot, MD 01/28/19 [id number]

## 2019-01-28 ENCOUNTER — Encounter: Payer: Self-pay | Admitting: Internal Medicine

## 2019-01-28 ENCOUNTER — Inpatient Hospital Stay: Payer: Medicare HMO

## 2019-01-28 ENCOUNTER — Other Ambulatory Visit: Payer: Self-pay

## 2019-01-28 ENCOUNTER — Inpatient Hospital Stay: Payer: Medicare HMO | Admitting: Certified Registered"

## 2019-01-28 ENCOUNTER — Encounter
Admission: EM | Disposition: A | Discharge status: Home Health | Payer: Self-pay | Source: Home / Self Care | Attending: Internal Medicine

## 2019-01-28 DIAGNOSIS — S72141A Displaced intertrochanteric fracture of right femur, initial encounter for closed fracture: Secondary | ICD-10-CM | POA: Diagnosis not present

## 2019-01-28 DIAGNOSIS — E559 Vitamin D deficiency, unspecified: Secondary | ICD-10-CM | POA: Diagnosis present

## 2019-01-28 DIAGNOSIS — I959 Hypotension, unspecified: Secondary | ICD-10-CM | POA: Diagnosis not present

## 2019-01-28 DIAGNOSIS — M25551 Pain in right hip: Secondary | ICD-10-CM | POA: Diagnosis not present

## 2019-01-28 DIAGNOSIS — I10 Essential (primary) hypertension: Secondary | ICD-10-CM | POA: Diagnosis present

## 2019-01-28 DIAGNOSIS — K8020 Calculus of gallbladder without cholecystitis without obstruction: Secondary | ICD-10-CM | POA: Diagnosis not present

## 2019-01-28 DIAGNOSIS — M2550 Pain in unspecified joint: Secondary | ICD-10-CM | POA: Diagnosis not present

## 2019-01-28 DIAGNOSIS — D509 Iron deficiency anemia, unspecified: Secondary | ICD-10-CM | POA: Diagnosis present

## 2019-01-28 DIAGNOSIS — Z7951 Long term (current) use of inhaled steroids: Secondary | ICD-10-CM | POA: Diagnosis not present

## 2019-01-28 DIAGNOSIS — R0902 Hypoxemia: Secondary | ICD-10-CM | POA: Diagnosis not present

## 2019-01-28 DIAGNOSIS — F419 Anxiety disorder, unspecified: Secondary | ICD-10-CM | POA: Diagnosis present

## 2019-01-28 DIAGNOSIS — I2510 Atherosclerotic heart disease of native coronary artery without angina pectoris: Secondary | ICD-10-CM | POA: Diagnosis present

## 2019-01-28 DIAGNOSIS — E8809 Other disorders of plasma-protein metabolism, not elsewhere classified: Secondary | ICD-10-CM | POA: Diagnosis not present

## 2019-01-28 DIAGNOSIS — S72001A Fracture of unspecified part of neck of right femur, initial encounter for closed fracture: Secondary | ICD-10-CM | POA: Diagnosis not present

## 2019-01-28 DIAGNOSIS — Z85828 Personal history of other malignant neoplasm of skin: Secondary | ICD-10-CM | POA: Diagnosis not present

## 2019-01-28 DIAGNOSIS — R262 Difficulty in walking, not elsewhere classified: Secondary | ICD-10-CM | POA: Diagnosis not present

## 2019-01-28 DIAGNOSIS — K56699 Other intestinal obstruction unspecified as to partial versus complete obstruction: Secondary | ICD-10-CM | POA: Diagnosis not present

## 2019-01-28 DIAGNOSIS — R69 Illness, unspecified: Secondary | ICD-10-CM | POA: Diagnosis not present

## 2019-01-28 DIAGNOSIS — Y9301 Activity, walking, marching and hiking: Secondary | ICD-10-CM | POA: Diagnosis present

## 2019-01-28 DIAGNOSIS — E538 Deficiency of other specified B group vitamins: Secondary | ICD-10-CM | POA: Diagnosis present

## 2019-01-28 DIAGNOSIS — Z79899 Other long term (current) drug therapy: Secondary | ICD-10-CM | POA: Diagnosis not present

## 2019-01-28 DIAGNOSIS — E878 Other disorders of electrolyte and fluid balance, not elsewhere classified: Secondary | ICD-10-CM | POA: Diagnosis present

## 2019-01-28 DIAGNOSIS — E876 Hypokalemia: Secondary | ICD-10-CM | POA: Diagnosis present

## 2019-01-28 DIAGNOSIS — K5000 Crohn's disease of small intestine without complications: Secondary | ICD-10-CM | POA: Diagnosis present

## 2019-01-28 DIAGNOSIS — Z87891 Personal history of nicotine dependence: Secondary | ICD-10-CM | POA: Diagnosis not present

## 2019-01-28 DIAGNOSIS — D72819 Decreased white blood cell count, unspecified: Secondary | ICD-10-CM | POA: Diagnosis present

## 2019-01-28 DIAGNOSIS — K50012 Crohn's disease of small intestine with intestinal obstruction: Secondary | ICD-10-CM | POA: Diagnosis not present

## 2019-01-28 DIAGNOSIS — E279 Disorder of adrenal gland, unspecified: Secondary | ICD-10-CM | POA: Diagnosis not present

## 2019-01-28 DIAGNOSIS — E778 Other disorders of glycoprotein metabolism: Secondary | ICD-10-CM | POA: Diagnosis present

## 2019-01-28 DIAGNOSIS — Z7401 Bed confinement status: Secondary | ICD-10-CM | POA: Diagnosis not present

## 2019-01-28 DIAGNOSIS — Y92010 Kitchen of single-family (private) house as the place of occurrence of the external cause: Secondary | ICD-10-CM | POA: Diagnosis not present

## 2019-01-28 DIAGNOSIS — E785 Hyperlipidemia, unspecified: Secondary | ICD-10-CM | POA: Diagnosis present

## 2019-01-28 DIAGNOSIS — D62 Acute posthemorrhagic anemia: Secondary | ICD-10-CM | POA: Diagnosis not present

## 2019-01-28 DIAGNOSIS — W19XXXA Unspecified fall, initial encounter: Secondary | ICD-10-CM | POA: Diagnosis not present

## 2019-01-28 DIAGNOSIS — K219 Gastro-esophageal reflux disease without esophagitis: Secondary | ICD-10-CM | POA: Diagnosis not present

## 2019-01-28 DIAGNOSIS — S72091A Other fracture of head and neck of right femur, initial encounter for closed fracture: Secondary | ICD-10-CM | POA: Diagnosis not present

## 2019-01-28 DIAGNOSIS — F329 Major depressive disorder, single episode, unspecified: Secondary | ICD-10-CM | POA: Diagnosis present

## 2019-01-28 DIAGNOSIS — D649 Anemia, unspecified: Secondary | ICD-10-CM | POA: Diagnosis not present

## 2019-01-28 DIAGNOSIS — W010XXA Fall on same level from slipping, tripping and stumbling without subsequent striking against object, initial encounter: Secondary | ICD-10-CM | POA: Diagnosis present

## 2019-01-28 DIAGNOSIS — M810 Age-related osteoporosis without current pathological fracture: Secondary | ICD-10-CM | POA: Diagnosis present

## 2019-01-28 HISTORY — PX: INTRAMEDULLARY (IM) NAIL INTERTROCHANTERIC: SHX5875

## 2019-01-28 LAB — MAGNESIUM: Magnesium: 1.6 mg/dL — ABNORMAL LOW (ref 1.7–2.4)

## 2019-01-28 LAB — BASIC METABOLIC PANEL
Anion gap: 9 (ref 5–15)
BUN: 10 mg/dL (ref 8–23)
CO2: 29 mmol/L (ref 22–32)
Calcium: 7.9 mg/dL — ABNORMAL LOW (ref 8.9–10.3)
Chloride: 99 mmol/L (ref 98–111)
Creatinine, Ser: 0.97 mg/dL (ref 0.44–1.00)
GFR calc Af Amer: >60 mL/min (ref >60)
Glucose, Bld: 117 mg/dL — ABNORMAL HIGH (ref 70–99)
Potassium: 3.2 mmol/L — ABNORMAL LOW (ref 3.5–5.1)
SODIUM: 137 mmol/L (ref 135–145)

## 2019-01-28 LAB — FOLATE: Folate: 9.9 ng/mL (ref >5.9)

## 2019-01-28 LAB — PROTIME-INR
INR: 1 (ref 0.8–1.2)
Prothrombin Time: 13.3 seconds (ref 11.4–15.2)

## 2019-01-28 LAB — PHOSPHORUS: Phosphorus: 2.2 mg/dL — ABNORMAL LOW (ref 2.5–4.6)

## 2019-01-28 LAB — VITAMIN B12: Vitamin B-12: 117 pg/mL — ABNORMAL LOW (ref 180–914)

## 2019-01-28 LAB — PREALBUMIN: Prealbumin: 16.1 mg/dL — ABNORMAL LOW (ref 18–38)

## 2019-01-28 LAB — APTT: aPTT: 30 seconds (ref 24–36)

## 2019-01-28 SURGERY — FIXATION, FRACTURE, INTERTROCHANTERIC, WITH INTRAMEDULLARY ROD
Anesthesia: General | Laterality: Right

## 2019-01-28 MED ORDER — EPHEDRINE SULFATE 50 MG/ML IJ SOLN
INTRAMUSCULAR | Status: AC
  Filled 2019-01-28: qty 1

## 2019-01-28 MED ORDER — KETAMINE HCL 50 MG/ML IJ SOLN
INTRAMUSCULAR | Status: AC
  Filled 2019-01-28: qty 10

## 2019-01-28 MED ORDER — HYDROMORPHONE HCL 1 MG/ML IJ SOLN
0.5000 mg | INTRAMUSCULAR | Status: DC | PRN
  Administered 2019-01-28: 0.5 mg via INTRAVENOUS
  Filled 2019-01-28: qty 1

## 2019-01-28 MED ORDER — MIDAZOLAM HCL 2 MG/2ML IJ SOLN
INTRAMUSCULAR | Status: AC
  Filled 2019-01-28: qty 2

## 2019-01-28 MED ORDER — FENTANYL CITRATE (PF) 100 MCG/2ML IJ SOLN: 25.0000 ug | INTRAMUSCULAR | Status: DC | PRN

## 2019-01-28 MED ORDER — FENTANYL CITRATE (PF) 250 MCG/5ML IJ SOLN
INTRAMUSCULAR | Status: AC
  Filled 2019-01-28: qty 5

## 2019-01-28 MED ORDER — FLEET ENEMA 7-19 GM/118ML RE ENEM: 1.0000 | ENEMA | Freq: Once | RECTAL | Status: DC | PRN

## 2019-01-28 MED ORDER — ROPINIROLE HCL 1 MG PO TABS
0.5000 mg | ORAL_TABLET | Freq: Every day | ORAL | Status: DC
  Administered 2019-01-28 – 2019-02-05 (×8): 0.5 mg via ORAL
  Filled 2019-01-28 (×5): qty 1
  Filled 2019-01-28: qty 2
  Filled 2019-01-28 (×4): qty 1

## 2019-01-28 MED ORDER — DICYCLOMINE HCL 20 MG PO TABS
20.0000 mg | ORAL_TABLET | Freq: Three times a day (TID) | ORAL | Status: DC
  Administered 2019-01-29 – 2019-02-06 (×25): 20 mg via ORAL
  Filled 2019-01-28 (×30): qty 1

## 2019-01-28 MED ORDER — PROPOFOL 10 MG/ML IV BOLUS
INTRAVENOUS | Status: DC | PRN
  Administered 2019-01-28: 100 mg via INTRAVENOUS

## 2019-01-28 MED ORDER — POTASSIUM CHLORIDE 2 MEQ/ML IV SOLN
INTRAVENOUS | Status: DC
  Administered 2019-01-28: 02:00:00 via INTRAVENOUS
  Filled 2019-01-28 (×5): qty 1000

## 2019-01-28 MED ORDER — PRAVASTATIN SODIUM 20 MG PO TABS
10.0000 mg | ORAL_TABLET | Freq: Every day | ORAL | Status: DC
  Administered 2019-01-28 – 2019-02-05 (×10): 10 mg via ORAL
  Filled 2019-01-28 (×11): qty 1

## 2019-01-28 MED ORDER — OXYCODONE HCL 5 MG PO TABS
5.0000 mg | ORAL_TABLET | ORAL | Status: DC | PRN
  Administered 2019-01-30 (×4): 10 mg via ORAL
  Administered 2019-01-31: 5 mg via ORAL
  Administered 2019-01-31: 10 mg via ORAL
  Administered 2019-02-01 (×2): 5 mg via ORAL
  Administered 2019-02-02 (×2): 10 mg via ORAL
  Administered 2019-02-03: 5 mg via ORAL
  Administered 2019-02-03 – 2019-02-06 (×8): 10 mg via ORAL
  Filled 2019-01-28 (×2): qty 2
  Filled 2019-01-28: qty 1
  Filled 2019-01-28 (×11): qty 2
  Filled 2019-01-28: qty 1
  Filled 2019-01-28 (×2): qty 2
  Filled 2019-01-28: qty 1
  Filled 2019-01-28: qty 2

## 2019-01-28 MED ORDER — FENTANYL CITRATE (PF) 100 MCG/2ML IJ SOLN
INTRAMUSCULAR | Status: AC
  Administered 2019-01-28: 100 ug via INTRAVENOUS
  Filled 2019-01-28: qty 2

## 2019-01-28 MED ORDER — POTASSIUM CHLORIDE 20 MEQ PO PACK
40.0000 meq | PACK | Freq: Two times a day (BID) | ORAL | Status: DC
  Administered 2019-01-28 (×2): 40 meq via ORAL
  Filled 2019-01-28 (×3): qty 2

## 2019-01-28 MED ORDER — POTASSIUM CHLORIDE CRYS ER 20 MEQ PO TBCR
40.0000 meq | EXTENDED_RELEASE_TABLET | Freq: Once | ORAL | Status: DC
  Filled 2019-01-28: qty 2

## 2019-01-28 MED ORDER — MEPERIDINE HCL 25 MG/ML IJ SOLN: 12.5000 mg | Freq: Once | INTRAMUSCULAR | Status: DC

## 2019-01-28 MED ORDER — MEPERIDINE HCL 50 MG/ML IJ SOLN
INTRAMUSCULAR | Status: AC
  Administered 2019-01-28: 12.5 mg via INTRAVENOUS
  Filled 2019-01-28: qty 1

## 2019-01-28 MED ORDER — SUGAMMADEX SODIUM 200 MG/2ML IV SOLN
INTRAVENOUS | Status: AC
  Filled 2019-01-28: qty 2

## 2019-01-28 MED ORDER — SODIUM CHLORIDE 0.9 % IV SOLN
INTRAVENOUS | Status: DC
  Administered 2019-01-28: 20:00:00 via INTRAVENOUS

## 2019-01-28 MED ORDER — BISACODYL 5 MG PO TBEC
5.0000 mg | DELAYED_RELEASE_TABLET | Freq: Every day | ORAL | Status: DC | PRN
  Filled 2019-01-28: qty 1

## 2019-01-28 MED ORDER — HYDROMORPHONE HCL 1 MG/ML IJ SOLN
INTRAMUSCULAR | Status: AC
  Filled 2019-01-28: qty 1

## 2019-01-28 MED ORDER — ONDANSETRON HCL 4 MG/2ML IJ SOLN
INTRAMUSCULAR | Status: DC | PRN
  Administered 2019-01-28: 4 mg via INTRAVENOUS

## 2019-01-28 MED ORDER — ONDANSETRON HCL 4 MG/2ML IJ SOLN: 4.0000 mg | Freq: Once | INTRAMUSCULAR | Status: DC | PRN

## 2019-01-28 MED ORDER — SODIUM CHLORIDE 0.9 % IR SOLN
Status: DC | PRN
  Administered 2019-01-28: 16:00:00 via TOPICAL

## 2019-01-28 MED ORDER — SUCCINYLCHOLINE CHLORIDE 20 MG/ML IJ SOLN
INTRAMUSCULAR | Status: DC | PRN
  Administered 2019-01-28: 80 mg via INTRAVENOUS

## 2019-01-28 MED ORDER — CYCLOBENZAPRINE HCL 10 MG PO TABS
5.0000 mg | ORAL_TABLET | Freq: Three times a day (TID) | ORAL | Status: DC | PRN
  Administered 2019-01-29 – 2019-02-03 (×7): 5 mg via ORAL
  Filled 2019-01-28 (×7): qty 1

## 2019-01-28 MED ORDER — METHOCARBAMOL 1000 MG/10ML IJ SOLN
500.0000 mg | Freq: Four times a day (QID) | INTRAVENOUS | Status: DC | PRN
  Filled 2019-01-28: qty 5

## 2019-01-28 MED ORDER — PHENYLEPHRINE HCL 10 MG/ML IJ SOLN
INTRAMUSCULAR | Status: DC | PRN
  Administered 2019-01-28 (×3): 100 ug via INTRAVENOUS
  Administered 2019-01-28: 50 ug via INTRAVENOUS
  Administered 2019-01-28: 100 ug via INTRAVENOUS
  Administered 2019-01-28: 200 ug via INTRAVENOUS

## 2019-01-28 MED ORDER — CLONAZEPAM 0.5 MG PO TABS: 0.5000 mg | ORAL_TABLET | ORAL | Status: DC

## 2019-01-28 MED ORDER — MONTELUKAST SODIUM 10 MG PO TABS
10.0000 mg | ORAL_TABLET | Freq: Every day | ORAL | Status: DC
  Filled 2019-01-28: qty 1

## 2019-01-28 MED ORDER — FENTANYL CITRATE (PF) 100 MCG/2ML IJ SOLN
INTRAMUSCULAR | Status: DC | PRN
  Administered 2019-01-28 (×2): 50 ug via INTRAVENOUS

## 2019-01-28 MED ORDER — BUPIVACAINE LIPOSOME 1.3 % IJ SUSP
INTRAMUSCULAR | Status: AC
  Filled 2019-01-28: qty 20

## 2019-01-28 MED ORDER — SENNOSIDES-DOCUSATE SODIUM 8.6-50 MG PO TABS: 1.0000 | ORAL_TABLET | Freq: Every evening | ORAL | Status: DC | PRN

## 2019-01-28 MED ORDER — PANTOPRAZOLE SODIUM 40 MG PO TBEC
40.0000 mg | DELAYED_RELEASE_TABLET | Freq: Two times a day (BID) | ORAL | Status: DC
  Administered 2019-01-28 – 2019-02-06 (×18): 40 mg via ORAL
  Filled 2019-01-28 (×19): qty 1

## 2019-01-28 MED ORDER — MIDAZOLAM HCL 5 MG/5ML IJ SOLN
INTRAMUSCULAR | Status: DC | PRN
  Administered 2019-01-28: 2 mg via INTRAVENOUS

## 2019-01-28 MED ORDER — NEOMYCIN-POLYMYXIN B GU 40-200000 IR SOLN
Status: AC
  Filled 2019-01-28: qty 4

## 2019-01-28 MED ORDER — FENTANYL CITRATE (PF) 100 MCG/2ML IJ SOLN
100.0000 ug | INTRAMUSCULAR | Status: DC | PRN
  Administered 2019-01-28 (×3): 100 ug via INTRAVENOUS
  Filled 2019-01-28 (×3): qty 2

## 2019-01-28 MED ORDER — ONDANSETRON HCL 4 MG/2ML IJ SOLN
4.0000 mg | Freq: Four times a day (QID) | INTRAMUSCULAR | Status: DC | PRN
  Administered 2019-01-29 – 2019-01-30 (×3): 4 mg via INTRAVENOUS
  Filled 2019-01-28 (×3): qty 2

## 2019-01-28 MED ORDER — MIRTAZAPINE 15 MG PO TABS
7.5000 mg | ORAL_TABLET | Freq: Every day | ORAL | Status: DC
  Administered 2019-01-30 – 2019-02-05 (×7): 7.5 mg via ORAL
  Filled 2019-01-28 (×9): qty 1

## 2019-01-28 MED ORDER — POTASSIUM PHOSPHATES 15 MMOLE/5ML IV SOLN
20.0000 mmol | Freq: Once | INTRAVENOUS | Status: AC
  Administered 2019-01-29: 20 mmol via INTRAVENOUS
  Filled 2019-01-28: qty 6.67

## 2019-01-28 MED ORDER — OXYCODONE HCL 5 MG PO TABS
2.5000 mg | ORAL_TABLET | ORAL | Status: DC | PRN
  Administered 2019-01-29 – 2019-02-02 (×4): 5 mg via ORAL
  Filled 2019-01-28 (×6): qty 1

## 2019-01-28 MED ORDER — DOCUSATE SODIUM 100 MG PO CAPS
100.0000 mg | ORAL_CAPSULE | Freq: Two times a day (BID) | ORAL | Status: DC
  Administered 2019-01-28 – 2019-01-29 (×2): 100 mg via ORAL
  Filled 2019-01-28 (×2): qty 1

## 2019-01-28 MED ORDER — CEFAZOLIN SODIUM-DEXTROSE 2-4 GM/100ML-% IV SOLN
2.0000 g | Freq: Once | INTRAVENOUS | Status: DC
  Filled 2019-01-28: qty 100

## 2019-01-28 MED ORDER — FLUOXETINE HCL 20 MG PO CAPS
80.0000 mg | ORAL_CAPSULE | Freq: Every day | ORAL | Status: DC
  Administered 2019-01-29 – 2019-02-06 (×9): 80 mg via ORAL
  Filled 2019-01-28 (×9): qty 4

## 2019-01-28 MED ORDER — AZATHIOPRINE 50 MG PO TABS
150.0000 mg | ORAL_TABLET | Freq: Every day | ORAL | Status: DC
  Administered 2019-01-29: 150 mg via ORAL
  Filled 2019-01-28 (×3): qty 3

## 2019-01-28 MED ORDER — DEXAMETHASONE SODIUM PHOSPHATE 10 MG/ML IJ SOLN
INTRAMUSCULAR | Status: AC
  Filled 2019-01-28: qty 1

## 2019-01-28 MED ORDER — ACETAMINOPHEN 325 MG PO TABS
650.0000 mg | ORAL_TABLET | Freq: Four times a day (QID) | ORAL | Status: DC | PRN
  Filled 2019-01-28: qty 2

## 2019-01-28 MED ORDER — SUCCINYLCHOLINE CHLORIDE 20 MG/ML IJ SOLN
INTRAMUSCULAR | Status: AC
  Filled 2019-01-28: qty 1

## 2019-01-28 MED ORDER — ENOXAPARIN SODIUM 40 MG/0.4ML ~~LOC~~ SOLN
40.0000 mg | SUBCUTANEOUS | Status: DC
  Administered 2019-01-29 – 2019-02-06 (×9): 40 mg via SUBCUTANEOUS
  Filled 2019-01-28 (×10): qty 0.4

## 2019-01-28 MED ORDER — CYANOCOBALAMIN 1000 MCG/ML IJ SOLN
1000.0000 ug | Freq: Once | INTRAMUSCULAR | Status: AC
  Administered 2019-01-29: 1000 ug via INTRAMUSCULAR
  Filled 2019-01-28: qty 1

## 2019-01-28 MED ORDER — METOCLOPRAMIDE HCL 10 MG PO TABS: 5.0000 mg | ORAL_TABLET | Freq: Three times a day (TID) | ORAL | Status: DC | PRN

## 2019-01-28 MED ORDER — ROCURONIUM BROMIDE 100 MG/10ML IV SOLN
INTRAVENOUS | Status: DC | PRN
  Administered 2019-01-28: 40 mg via INTRAVENOUS

## 2019-01-28 MED ORDER — ACETAMINOPHEN 500 MG PO TABS
1000.0000 mg | ORAL_TABLET | Freq: Three times a day (TID) | ORAL | Status: AC
  Administered 2019-01-28 – 2019-01-29 (×4): 1000 mg via ORAL
  Filled 2019-01-28 (×4): qty 2

## 2019-01-28 MED ORDER — METOCLOPRAMIDE HCL 5 MG/ML IJ SOLN: 5.0000 mg | Freq: Three times a day (TID) | INTRAMUSCULAR | Status: DC | PRN

## 2019-01-28 MED ORDER — KETAMINE HCL 50 MG/ML IJ SOLN
INTRAMUSCULAR | Status: DC | PRN
  Administered 2019-01-28: 40 mg via INTRAMUSCULAR

## 2019-01-28 MED ORDER — CLONAZEPAM 0.5 MG PO TABS
0.5000 mg | ORAL_TABLET | Freq: Every day | ORAL | Status: DC
  Filled 2019-01-28: qty 2

## 2019-01-28 MED ORDER — BISACODYL 10 MG RE SUPP: 10.0000 mg | Freq: Every day | RECTAL | Status: DC | PRN

## 2019-01-28 MED ORDER — ONDANSETRON HCL 4 MG/2ML IJ SOLN
4.0000 mg | Freq: Four times a day (QID) | INTRAMUSCULAR | Status: DC | PRN
  Administered 2019-01-28: 4 mg via INTRAVENOUS
  Filled 2019-01-28: qty 2

## 2019-01-28 MED ORDER — ONDANSETRON HCL 4 MG PO TABS
4.0000 mg | ORAL_TABLET | Freq: Four times a day (QID) | ORAL | Status: DC | PRN
  Administered 2019-02-01: 4 mg via ORAL
  Filled 2019-01-28: qty 1

## 2019-01-28 MED ORDER — GLYCOPYRROLATE 0.2 MG/ML IJ SOLN
INTRAMUSCULAR | Status: AC
  Filled 2019-01-28: qty 1

## 2019-01-28 MED ORDER — ACETAMINOPHEN 10 MG/ML IV SOLN
INTRAVENOUS | Status: AC
  Filled 2019-01-28: qty 100

## 2019-01-28 MED ORDER — PHENYLEPHRINE HCL 10 MG/ML IJ SOLN
INTRAMUSCULAR | Status: AC
  Filled 2019-01-28: qty 1

## 2019-01-28 MED ORDER — ONDANSETRON HCL 4 MG/2ML IJ SOLN
INTRAMUSCULAR | Status: AC
  Filled 2019-01-28: qty 2

## 2019-01-28 MED ORDER — SUGAMMADEX SODIUM 200 MG/2ML IV SOLN
INTRAVENOUS | Status: DC | PRN
  Administered 2019-01-28: 180 mg via INTRAVENOUS

## 2019-01-28 MED ORDER — METHOCARBAMOL 500 MG PO TABS
500.0000 mg | ORAL_TABLET | Freq: Four times a day (QID) | ORAL | Status: DC | PRN
  Administered 2019-01-29 – 2019-02-02 (×5): 500 mg via ORAL
  Filled 2019-01-28 (×7): qty 1

## 2019-01-28 MED ORDER — BUPIVACAINE HCL (PF) 0.5 % IJ SOLN
INTRAMUSCULAR | Status: AC
  Filled 2019-01-28: qty 30

## 2019-01-28 MED ORDER — ENOXAPARIN SODIUM 40 MG/0.4ML ~~LOC~~ SOLN
40.0000 mg | SUBCUTANEOUS | Status: DC
  Filled 2019-01-28: qty 0.4

## 2019-01-28 MED ORDER — LACTATED RINGERS IV SOLN
INTRAVENOUS | Status: DC | PRN
  Administered 2019-01-28 (×2): via INTRAVENOUS

## 2019-01-28 MED ORDER — TRAMADOL HCL 50 MG PO TABS
50.0000 mg | ORAL_TABLET | Freq: Four times a day (QID) | ORAL | Status: DC | PRN
  Administered 2019-02-01 – 2019-02-04 (×5): 50 mg via ORAL
  Filled 2019-01-28 (×5): qty 1

## 2019-01-28 MED ORDER — CEFAZOLIN SODIUM-DEXTROSE 2-3 GM-%(50ML) IV SOLR
INTRAVENOUS | Status: DC | PRN
  Administered 2019-01-28: 2 g via INTRAVENOUS

## 2019-01-28 MED ORDER — HYDROMORPHONE HCL 1 MG/ML IJ SOLN
0.2500 mg | INTRAMUSCULAR | Status: DC | PRN
  Administered 2019-01-29 (×5): 0.5 mg via INTRAVENOUS
  Filled 2019-01-28 (×6): qty 1

## 2019-01-28 MED ORDER — ROCURONIUM BROMIDE 50 MG/5ML IV SOLN
INTRAVENOUS | Status: AC
  Filled 2019-01-28: qty 1

## 2019-01-28 MED ORDER — MEPERIDINE HCL 25 MG/ML IJ SOLN
12.5000 mg | INTRAMUSCULAR | Status: AC | PRN
  Administered 2019-01-28: 12.5 mg via INTRAVENOUS
  Filled 2019-01-28: qty 1

## 2019-01-28 MED ORDER — CEFAZOLIN SODIUM-DEXTROSE 2-4 GM/100ML-% IV SOLN
2.0000 g | Freq: Four times a day (QID) | INTRAVENOUS | Status: DC
  Administered 2019-01-28 – 2019-01-29 (×3): 2 g via INTRAVENOUS
  Filled 2019-01-28 (×4): qty 100

## 2019-01-28 MED ORDER — HYDROMORPHONE HCL 1 MG/ML IJ SOLN
1.0000 mg | INTRAMUSCULAR | Status: DC | PRN
  Administered 2019-01-28: 1 mg via INTRAVENOUS
  Administered 2019-01-28: 03:00:00 via INTRAVENOUS
  Filled 2019-01-28: qty 1

## 2019-01-28 MED ORDER — FLUOXETINE HCL 20 MG PO CAPS
40.0000 mg | ORAL_CAPSULE | Freq: Two times a day (BID) | ORAL | Status: DC
  Filled 2019-01-28: qty 2

## 2019-01-28 MED ORDER — DIPHENOXYLATE-ATROPINE 2.5-0.025 MG PO TABS: 1.0000 | ORAL_TABLET | Freq: Four times a day (QID) | ORAL | Status: DC | PRN

## 2019-01-28 MED ORDER — DEXAMETHASONE SODIUM PHOSPHATE 10 MG/ML IJ SOLN
INTRAMUSCULAR | Status: DC | PRN
  Administered 2019-01-28: 6 mg via INTRAVENOUS

## 2019-01-28 MED ORDER — EPHEDRINE SULFATE 50 MG/ML IJ SOLN
INTRAMUSCULAR | Status: DC | PRN
  Administered 2019-01-28 (×2): 10 mg via INTRAVENOUS

## 2019-01-28 MED ORDER — ACETAMINOPHEN 10 MG/ML IV SOLN
INTRAVENOUS | Status: DC | PRN
  Administered 2019-01-28: 1000 mg via INTRAVENOUS

## 2019-01-28 MED ORDER — MOMETASONE FURO-FORMOTEROL FUM 100-5 MCG/ACT IN AERO
2.0000 | INHALATION_SPRAY | Freq: Two times a day (BID) | RESPIRATORY_TRACT | Status: DC
  Administered 2019-01-28 – 2019-02-06 (×18): 2 via RESPIRATORY_TRACT
  Filled 2019-01-28 (×2): qty 8.8

## 2019-01-28 MED ORDER — SODIUM CHLORIDE 0.9 % IV SOLN
INTRAVENOUS | Status: DC | PRN
  Administered 2019-01-28: 30 ug/min via INTRAVENOUS

## 2019-01-28 MED ORDER — MORPHINE SULFATE (PF) 4 MG/ML IV SOLN
4.0000 mg | Freq: Once | INTRAVENOUS | Status: AC
  Administered 2019-01-28: 4 mg via INTRAVENOUS
  Filled 2019-01-28: qty 1

## 2019-01-28 MED ORDER — SENNOSIDES-DOCUSATE SODIUM 8.6-50 MG PO TABS
1.0000 | ORAL_TABLET | Freq: Every evening | ORAL | Status: DC | PRN
  Filled 2019-01-28: qty 1

## 2019-01-28 MED ORDER — POTASSIUM CHLORIDE 2 MEQ/ML IV SOLN: INTRAVENOUS | Status: DC

## 2019-01-28 MED ORDER — PROPOFOL 500 MG/50ML IV EMUL
INTRAVENOUS | Status: AC
  Filled 2019-01-28: qty 50

## 2019-01-28 MED ORDER — FLUCONAZOLE 100 MG PO TABS: 100.0000 mg | ORAL_TABLET | Freq: Every day | ORAL | Status: DC

## 2019-01-28 SURGICAL SUPPLY — 45 items
APL PRP STRL LF DISP 70% ISPRP (MISCELLANEOUS) ×1
BIT DRILL 4.0X280 (BIT) ×1 IMPLANT
BLADE SURG 15 STRL LF DISP TIS (BLADE) ×1 IMPLANT
BLADE SURG 15 STRL SS (BLADE) ×2
CANISTER SUCT 1200ML W/VALVE (MISCELLANEOUS) ×2 IMPLANT
CHLORAPREP W/TINT 26 (MISCELLANEOUS) ×2 IMPLANT
COVER WAND RF STERILE (DRAPES) ×1 IMPLANT
DRAPE SHEET LG 3/4 BI-LAMINATE (DRAPES) ×1 IMPLANT
DRAPE SURG 17X11 SM STRL (DRAPES) ×4 IMPLANT
DRAPE U-SHAPE 47X51 STRL (DRAPES) ×4 IMPLANT
DRSG OPSITE POSTOP 3X4 (GAUZE/BANDAGES/DRESSINGS) ×4 IMPLANT
DRSG OPSITE POSTOP 4X6 (GAUZE/BANDAGES/DRESSINGS) ×1 IMPLANT
ELECT REM PT RETURN 9FT ADLT (ELECTROSURGICAL) ×2
ELECTRODE REM PT RTRN 9FT ADLT (ELECTROSURGICAL) ×1 IMPLANT
GLOVE BIOGEL PI IND STRL 8 (GLOVE) ×1 IMPLANT
GLOVE BIOGEL PI INDICATOR 8 (GLOVE) ×1
GLOVE SURG SYN 7.5  E (GLOVE) ×1
GLOVE SURG SYN 7.5 E (GLOVE) ×1 IMPLANT
GLOVE SURG SYN 7.5 PF PI (GLOVE) ×1 IMPLANT
GOWN STRL REUS W/ TWL LRG LVL3 (GOWN DISPOSABLE) ×1 IMPLANT
GOWN STRL REUS W/ TWL XL LVL3 (GOWN DISPOSABLE) ×1 IMPLANT
GOWN STRL REUS W/TWL LRG LVL3 (GOWN DISPOSABLE) ×2
GOWN STRL REUS W/TWL XL LVL3 (GOWN DISPOSABLE) ×2
GUIDE PIN 3.2X330 (PIN) ×2
GUIDEWIRE BALL NOSE 3.0X900 (WIRE) ×1 IMPLANT
KIT PATIENT CARE HANA TABLE (KITS) ×2 IMPLANT
KIT TURNOVER KIT A (KITS) ×2 IMPLANT
MAT ABSORB  FLUID 56X50 GRAY (MISCELLANEOUS) ×2
MAT ABSORB FLUID 56X50 GRAY (MISCELLANEOUS) ×2 IMPLANT
NAIL LONG ES 125D 39X11 (Nail) ×1 IMPLANT
NDL FILTER BLUNT 18X1 1/2 (NEEDLE) ×1 IMPLANT
NEEDLE FILTER BLUNT 18X 1/2SAF (NEEDLE) ×1
NEEDLE FILTER BLUNT 18X1 1/2 (NEEDLE) ×1 IMPLANT
NEEDLE HYPO 22GX1.5 SAFETY (NEEDLE) ×2 IMPLANT
NS IRRIG 1000ML POUR BTL (IV SOLUTION) ×2 IMPLANT
PACK HIP COMPR (MISCELLANEOUS) ×2 IMPLANT
PENCIL ELECTRO HAND CTR (MISCELLANEOUS) ×2 IMPLANT
PIN GUIDE 3.2X330 (PIN) IMPLANT
SCREW CORTICAL 5.0X32 (Screw) ×1 IMPLANT
SCREW LAG GALILEO 10.5X105 (Screw) ×1 IMPLANT
STAPLER SKIN PROX 35W (STAPLE) ×2 IMPLANT
SUT VIC AB 2-0 CT2 27 (SUTURE) ×2 IMPLANT
SYR 10ML LL (SYRINGE) ×2 IMPLANT
SYR 30ML LL (SYRINGE) ×2 IMPLANT
TAPE CLOTH 3X10 WHT NS LF (GAUZE/BANDAGES/DRESSINGS) ×3 IMPLANT

## 2019-01-28 NOTE — Anesthesia Post-op Follow-up Note (Signed)
Anesthesia QCDR form completed.        

## 2019-01-28 NOTE — ED Notes (Signed)
Pedal pulses present bilateral feet.

## 2019-01-28 NOTE — ED Notes (Signed)
Purewick catheter removed.

## 2019-01-28 NOTE — Progress Notes (Signed)
Full consult note and discussion with patient to follow later today.  Called by ED staff. Imaging reviewed. Labs reviewed. - Plan for surgery in the afternoon.  - NPO until OR - Hold anticoagulation - Admit to Hospitalist team. Please optimize medically prior to planned OR.

## 2019-01-28 NOTE — ED Notes (Addendum)
Pt assessed in bed, awake, states her pain is increasing in her right hip. Denies any other pain at this time.

## 2019-01-28 NOTE — Progress Notes (Signed)
I was unable to see the patient for consultation today as she has been off the floor.  She is currently in the operative room for hip fracture therefore I am unable to see her for consultation today.  Patient was signed out to Dr. Marius Ditch, who will see her for GI consult tomorrow.  Please page GI on-call for any emergent GI issues.  Otherwise, patient will be seen for consultation tomorrow.

## 2019-01-28 NOTE — Anesthesia Postprocedure Evaluation (Signed)
Anesthesia Post Note  Patient: Kimberly Hudson  Procedure(s) Performed: INTRAMEDULLARY (IM) NAIL INTERTROCHANTRIC (Right )  Patient location during evaluation: PACU Anesthesia Type: General Level of consciousness: awake and alert Pain management: pain level controlled Vital Signs Assessment: post-procedure vital signs reviewed and stable Respiratory status: spontaneous breathing, nonlabored ventilation, respiratory function stable and patient connected to nasal cannula oxygen Cardiovascular status: blood pressure returned to baseline and stable Postop Assessment: no apparent nausea or vomiting Anesthetic complications: no     Last Vitals:  Vitals:   01/28/19 2100 01/28/19 2217  BP: 109/70 (!) 103/58  Pulse: 84 82  Resp: 17 17  Temp: 37.2 C 37 C  SpO2: 99% 99%    Last Pain:  Vitals:   01/28/19 2217  TempSrc: Oral  PainSc:                  Ravan Schlemmer S

## 2019-01-28 NOTE — Consult Note (Signed)
ORTHOPAEDIC CONSULTATION  REQUESTING PHYSICIAN: Hillary Bow, MD  Chief Complaint:   R hip pain  History of Present Illness: Kimberly Hudson is a 63 y.o. female who had a fall at home last night.  The patient noted immediate hip pain and inability to ambulate.  The patient ambulates with a cane at baseline. She lives with her family at home. Pain is described as sharp at its worst and a dull ache at its best.  Pain is rated a 10 out of 10 in severity.  Pain is improved with rest and immobilization.  Pain is worse with any sort of movement.  X-rays in the emergency department show a right intertrochanteric hip fracture. She does not smoke.  Of note, she had significant hypokalemia on admission.  Past Medical History:  Diagnosis Date  . Anemia    past hx  . Anxiety   . Arthritis    osteo - shoulders, hips, knees  . Complication of anesthesia    propofol causes headaches  . Coronary artery disease   . Crohn's disease ([place])   . Depression   . GERD (gastroesophageal reflux disease)   . History of chicken pox   . History of measles   . History of mumps   . Hypertension   . 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 Wears dentures    full upper   Past Surgical History:  Procedure Laterality Date  . COLONOSCOPY  02/2013   No polyps were found  . ESOPHAGEAL DILATION  05/29/2017   Procedure: ESOPHAGEAL DILATION;  Surgeon: Lucilla Lame, MD;  Location: [place];  Service: Endoscopy;;  . ESOPHAGOGASTRODUODENOSCOPY (EGD) WITH PROPOFOL N/A 06/28/2015   Procedure: ESOPHAGOGASTRODUODENOSCOPY (EGD) WITH PROPOFOL;  Surgeon: Lucilla Lame, MD;  Location: [place];  Service: Endoscopy;  Laterality: N/A;  with dialation  . ESOPHAGOGASTRODUODENOSCOPY (EGD) WITH PROPOFOL N/A 07/26/2015   Procedure: ESOPHAGOGASTRODUODENOSCOPY (EGD) WITH PROPOFOL, with dialation;  Surgeon: Lucilla Lame, MD;  Location: [place];  Service: Endoscopy;  Laterality: N/A;  LEAVE PT LAST  . ESOPHAGOGASTRODUODENOSCOPY (EGD) WITH PROPOFOL N/A 08/05/2016   Procedure: ESOPHAGOGASTRODUODENOSCOPY (EGD) WITH PROPOFOL;  Surgeon: Lucilla Lame, MD;  Location: [place];  Service: Endoscopy;  Laterality: N/A;  . ESOPHAGOGASTRODUODENOSCOPY (EGD) WITH PROPOFOL N/A 09/26/2016   Procedure: ESOPHAGOGASTRODUODENOSCOPY (EGD) WITH PROPOFOL;  Surgeon: Lucilla Lame, MD;  Location: [place];  Service: Endoscopy;  Laterality: N/A;  . ESOPHAGOGASTRODUODENOSCOPY (EGD) WITH PROPOFOL N/A 05/29/2017   Procedure: ESOPHAGOGASTRODUODENOSCOPY (EGD) WITH PROPOFOL - Injection of steroid in esophagus;  Surgeon: Lucilla Lame, MD;  Location: [place];  Service: Endoscopy;  Laterality: N/A;  . Excision BCC  07/28/2013   right nares, Georgiann Cocker  . TUBAL LIGATION  1986  . UPPER GI ENDOSCOPY  02/2014   Social History   Socioeconomic History  . Marital status: Widowed    Spouse name: Not on file  . Number of children: 2  . Years of education: Not on file  . Highest education level: High school graduate  Occupational History  . Occupation: Investment banker, operational  . Financial resource strain: Somewhat hard  . Food insecurity:    Worry: Sometimes true    Inability: Sometimes true  . Transportation needs:    Medical: No    Non-medical: No  Tobacco Use  . Smoking status: Former Smoker    Packs/day: 1.00    Years: 10.00    Pack years: 10.00    Types: Cigarettes    Last attempt to  quit: 11/18/2012    Years since quitting: 6.1  . Smokeless tobacco: Never Used  . Tobacco comment: smoked up to 1 ppd for about 10 years.   Substance and Sexual Activity  . Alcohol use: Not Currently    Alcohol/week: 0.0 standard drinks    Comment: every once in a while  . Drug use: No  . Sexual activity: Not Currently  Lifestyle  . Physical activity:    Days per week: 0 days    Minutes per session: 0 min  . Stress: Very much   Relationships  . Social connections:    Talks on phone: Not on file    Gets together: Not on file    Attends religious service: Never    Active member of club or organization: No    Attends meetings of clubs or organizations: Never    Relationship status: Widowed  Other Topics Concern  . Not on file  Social History Narrative  . Not on file   Family History  Problem Relation Age of Onset  . COPD Mother   . Heart disease Mother   . COPD Father   . Emphysema Father   . Prostate cancer Father   . Alcohol abuse Father   . Anxiety disorder Father   . Depression Father   . Schizophrenia Father   . COPD Sister   . Heart disease Sister   . Alcohol abuse Sister   . Anxiety disorder Sister   . Depression Sister   . Alcohol abuse Brother   . Alcohol abuse Sister   . Anxiety disorder Sister   . Depression Sister   . Alcohol abuse Sister   . Anxiety disorder Sister   . Depression Sister   . Anxiety disorder Sister   . Depression Sister   . Anxiety disorder Sister   . Depression Sister   . Drug abuse Son   . Drug abuse Other   . Drug abuse Other   . Drug abuse Other    Allergies  Allergen Reactions  . Nsaids Other (See Comments)    Because of Crohns Because of Crohns  . Sulfa Antibiotics Rash   Prior to Admission medications   Medication Sig Start Date End Date Taking? Authorizing Provider  azaTHIOprine (IMURAN) 50 MG tablet Take 3 tablets (150 mg total) by mouth daily. 12/03/18  Yes Lucilla Lame, MD  clonazePAM (KLONOPIN) 1 MG tablet Take 0.5-1 tablets (0.5-1 mg total) by mouth as directed. Take half tablet at bedtime tuesdays and saturdays and continue 1 tablet rest of the days 09/30/18  Yes Eappen, Saramma, MD  FLUoxetine (PROZAC) 40 MG capsule TAKE 1 CAPSULE BY MOUTH TWICE A DAY Patient taking differently: Take 80 mg by mouth daily.  05/21/18  Yes Birdie Sons, MD  fluticasone (FLONASE) 50 MCG/ACT nasal spray Place 2 sprays into both nostrils daily. 02/16/18  Yes  Birdie Sons, MD  HYDROcodone-acetaminophen (NORCO) 7.5-325 MG tablet Take 1 tablet by mouth every 6 (six) hours as needed for moderate pain. 01/15/19  Yes Birdie Sons, MD  lisinopril-hydrochlorothiazide (PRINZIDE,ZESTORETIC) 10-12.5 MG tablet TAKE 1 TABLET BY MOUTH DAILY. Patient taking differently: Take 0.5 tablets by mouth daily.  12/29/17  Yes Birdie Sons, MD  montelukast (SINGULAIR) 10 MG tablet TAKE 1 TABLET BY MOUTH EVERYDAY AT BEDTIME 08/21/18  Yes Birdie Sons, MD  pantoprazole (PROTONIX) 40 MG tablet TAKE 1 TABLET BY MOUTH TWICE A DAY 01/05/19  Yes Lucilla Lame, MD  SYMBICORT 80-4.5 MCG/ACT inhaler TAKE  2 PUFFS BY MOUTH TWICE A DAY 04/27/18  Yes Birdie Sons, MD  cyclobenzaprine (FLEXERIL) 5 MG tablet TAKE 1 TABLET BY MOUTH THREE TIMES A DAY AS NEEDED FOR MUSCLE SPASMS 12/20/18   Birdie Sons, MD  dicyclomine (BENTYL) 20 MG tablet TAKE 1 TABLET (20 MG TOTAL) BY MOUTH 3 (THREE) TIMES DAILY BEFORE MEALS. Patient not taking: Reported on 01/28/2019 12/19/17   Lucilla Lame, MD  diphenoxylate-atropine (LOMOTIL) 2.5-0.025 MG tablet Take 1 tablet by mouth 4 (four) times daily as needed for diarrhea or loose stools. 05/30/17   Lucilla Lame, MD  doxycycline (VIBRA-TABS) 100 MG tablet Take 1 tablet (100 mg total) by mouth 2 (two) times daily. Patient not taking: Reported on 01/28/2019 09/26/18   Jerrol Banana., MD  fluconazole (DIFLUCAN) 100 MG tablet TAKE 1 TABLET BY MOUTH EVERY DAY Patient not taking: Reported on 01/28/2019 01/18/19   Birdie Sons, MD  mirtazapine (REMERON) 7.5 MG tablet TAKE 1 TABLET (7.5 MG TOTAL) BY MOUTH AT BEDTIME. FOR SLEEP Patient not taking: Reported on 01/28/2019 11/25/18   Ursula Alert, MD  oseltamivir (TAMIFLU) 75 MG capsule Take 1 capsule (75 mg total) by mouth 2 (two) times daily. Patient not taking: Reported on 01/28/2019 01/19/19   Chevis Pretty, FNP  pravastatin (PRAVACHOL) 10 MG tablet TAKE 1 TABLET BY MOUTH EVERY DAY 01/18/19   Birdie Sons, MD  promethazine (PHENERGAN) 25 MG tablet TAKE 1 TABLET BY MOUTH EVERY 6 HOURS AS NEEDED FOR NAUSEA AND VOMITING 12/03/18   Lucilla Lame, MD  promethazine-dextromethorphan (PROMETHAZINE-DM) 6.25-15 MG/5ML syrup Take 5 mLs by mouth 4 (four) times daily as needed for cough. Patient not taking: Reported on 01/28/2019 09/26/18   Jerrol Banana., MD  rOPINIRole (REQUIP) 0.5 MG tablet TAKE 1/2 TAB AT BEDTIME X3 DAYS, THEN 1 AT BEDTIME X3 DAYS, THEN INCREASE TO 2 AT BEDTIME IF NEEDED. 10/02/18   Birdie Sons, MD   Recent Labs    01/27/19 2247 01/28/19 0847  WBC 3.4*  --   HGB 9.2*  --   HCT 26.9*  --   PLT 290  --   K <2.0* 3.2*  CL 96* 99  CO2 27 29  BUN 9 10  CREATININE 0.92 0.97  GLUCOSE 122* 117*  CALCIUM 7.6* 7.9*  INR 1.0  --    Dg Chest 1 View  Result Date: 01/27/2019 CLINICAL DATA:  Right hip pain pain after fall. EXAM: CHEST  1 VIEW COMPARISON:  Report from 12/05/2008 FINDINGS: The lung volumes are low. The cardiopericardial silhouette is mildly enlarged in part due to the AP projection. No acute pulmonary consolidation, effusion or pneumothorax. Retrocardiac opacities may reflect atelectasis. No acute displaced fracture. No abnormal fluid collections. IMPRESSION: No active disease. Mildly enlarged cardiopericardial silhouette in part due to the AP projection. Electronically Signed   By: Ashley Royalty M.D.   On: 01/27/2019 23:46   Dg Pelvis 1-2 Views  Result Date: 01/27/2019 CLINICAL DATA:  Right hip pain and deformity. EXAM: PELVIS - 1-2 VIEW COMPARISON:  None. FINDINGS: There is an acute, comminuted, varus angulated closed fracture of the proximal left femur involving the basicervical portion of the femur. This fracture undermines the greater trochanter in exits in the subtrochanteric portion of the femur. Avulsed lesser trochanter is also suggested. No joint dislocation. No pelvic diastasis. Lower lumbar degenerative disc and endplate changes are noted from L3  through S1. The native left hip is intact with degenerative joint space narrowing. IMPRESSION:  Acute, varus angulated, basicervical fracture of the right femur extending through the intertrochanteric portion of the femur and undermining the greater trochanter. Avulsed lesser trochanter is also seen. Electronically Signed   By: Ashley Royalty M.D.   On: 01/27/2019 23:52   Dg Femur Min 2 Views Right  Result Date: 01/27/2019 CLINICAL DATA:  Right hip pain and deformity EXAM: RIGHT FEMUR 2 VIEWS COMPARISON:  None. FINDINGS: There is an acute, comminuted, varus angulated closed fracture of the proximal left femur involving the basicervical portion of the femur. This fracture undermines the greater trochanter in exits in the subtrochanteric portion of the femur. Avulsed lesser trochanter is also suggested. No joint dislocation. The included pubic rami appear intact. No pelvic diastasis is seen. Osteopenic appearance of the included knee with tricompartmental osteoarthritis and spurring noted. No joint effusion is identified though the study is somewhat limited due to obliquity. If the patient is symptomatic over the knee, dedicated knee radiographs are recommended for better assessment. IMPRESSION: Acute, varus angulated, basicervical fracture of the right femur extending through the intertrochanteric portion of the femur and undermining the greater trochanter. Avulsed lesser trochanter is also seen. Electronically Signed   By: Ashley Royalty M.D.   On: 01/27/2019 23:49     Positive ROS: All other systems have been reviewed and were otherwise negative with the exception of those mentioned in the HPI and as above.  Physical Exam: BP 122/64 (BP Location: Left Arm)   Pulse 74   Temp 98.2 F (36.8 C) (Oral)   Resp 15   Ht 5' 7"  (1.702 m)   Wt 88.5 kg   SpO2 98%   BMI 30.54 kg/m  General:  Alert, no acute distress Psychiatric:  Patient is competent for consent with normal mood and affect   Cardiovascular:  No  pedal edema, regular rate and rhythm Respiratory:  No wheezing, non-labored breathing GI:  Abdomen is soft and non-tender Skin:  No lesions in the area of chief complaint, no erythema Neurologic:  Sensation intact distally, CN grossly intact Lymphatic:  No axillary or cervical lymphadenopathy  Orthopedic Exam:  RLE: 5/5 DF/PF/EHL SILT s/s/t/sp/dp distr Foot wwp +Log roll/axial load   X-rays:  As above: R intertrochanteric hip fracture  Assessment/Plan: Kimberly Hudson is a 64 y.o. female with a R intertrochanteric hip fracture   1. I discussed the various treatment options including both surgical and non-surgical management of her fracture with the patient. We discussed the high risk of perioperative complications due to patient's age and other co-morbidities. After discussion of risks, benefits, and alternatives to surgery, the patient was in agreement to proceed with surgery. The goals of surgery would be to provide adequate pain relief and allow for early mobilization. Plan for surgery is R hip cephalomedullary nailing today, 01/28/19 2. NPO until OR 3. Hold anticoagulation in advance of OR 4. Admit to Hospitalist service. Continue appropriate repletion. K improved to 3.2.        Leim Fabry   01/28/2019 12:40 PM

## 2019-01-28 NOTE — Anesthesia Preprocedure Evaluation (Addendum)
Anesthesia Evaluation  Patient identified by MRN, date of birth, ID band Patient awake    Reviewed: Allergy & Precautions, H&P , NPO status , Patient's Chart, lab work & pertinent test results, reviewed documented beta blocker date and time   Airway Mallampati: III  TM Distance: >3 FB     Dental  (+) Chipped   Pulmonary former smoker,           Cardiovascular hypertension, Pt. on medications + CAD       Neuro/Psych PSYCHIATRIC DISORDERS Anxiety Depression  Neuromuscular disease    GI/Hepatic Neg liver ROS, GERD  Controlled,Crohn's disease   Endo/Other  negative endocrine ROS  Renal/GU      Musculoskeletal  (+) Arthritis ,   Abdominal   Peds  Hematology  (+) Blood dyscrasia, anemia ,   Anesthesia Other Findings Past Medical History: No date: Anemia     Comment:  past hx No date: Anxiety No date: Arthritis     Comment:  osteo - shoulders, hips, knees No date: Complication of anesthesia     Comment:  propofol causes headaches No date: Coronary artery disease No date: Crohn's disease ([facility]) No date: Depression No date: GERD (gastroesophageal reflux disease) No date: History of chicken pox No date: History of measles No date: History of mumps No date: Hypertension 06/19/2015: Immunosuppression-related infectious disease No date: Wears dentures     Comment:  full upper  Past Surgical History: 02/2013: COLONOSCOPY     Comment:  No polyps were found 05/29/2017: ESOPHAGEAL DILATION     Comment:  Procedure: ESOPHAGEAL DILATION;  Surgeon: Lucilla Lame,               MD;  Location: [place];  Service: Endoscopy;; 06/28/2015: ESOPHAGOGASTRODUODENOSCOPY (EGD) WITH PROPOFOL; N/A     Comment:  Procedure: ESOPHAGOGASTRODUODENOSCOPY (EGD) WITH               PROPOFOL;  Surgeon: Lucilla Lame, MD;  Location: [place];  Service: Endoscopy;  Laterality: N/A;                with  dialation 07/26/2015: ESOPHAGOGASTRODUODENOSCOPY (EGD) WITH PROPOFOL; N/A     Comment:  Procedure: ESOPHAGOGASTRODUODENOSCOPY (EGD) WITH               PROPOFOL, with dialation;  Surgeon: Lucilla Lame, MD;                Location: [employer] [facility];  Service: Endoscopy;                Laterality: N/A;  LEAVE PT LAST 08/05/2016: ESOPHAGOGASTRODUODENOSCOPY (EGD) WITH PROPOFOL; N/A     Comment:  Procedure: ESOPHAGOGASTRODUODENOSCOPY (EGD) WITH               PROPOFOL;  Surgeon: Lucilla Lame, MD;  Location: [place];  Service: Endoscopy;  Laterality: N/A; 09/26/2016: ESOPHAGOGASTRODUODENOSCOPY (EGD) WITH PROPOFOL; N/A     Comment:  Procedure: ESOPHAGOGASTRODUODENOSCOPY (EGD) WITH               PROPOFOL;  Surgeon: Lucilla Lame, MD;  Location: [place];  Service: Endoscopy;  Laterality: N/A; 05/29/2017: ESOPHAGOGASTRODUODENOSCOPY (EGD) WITH PROPOFOL; N/A     Comment:  Procedure: ESOPHAGOGASTRODUODENOSCOPY (EGD) WITH  PROPOFOL - Injection of steroid in esophagus;  Surgeon:               Lucilla Lame, MD;  Location: [place];                Service: Endoscopy;  Laterality: N/A; 07/28/2013: Excision BCC     Comment:  right nares, Georgiann Cocker 1986: TUBAL LIGATION 02/2014: UPPER GI ENDOSCOPY  BMI    Body Mass Index:  30.54 kg/m      Reproductive/Obstetrics negative OB ROS                           Anesthesia Physical Anesthesia Plan  ASA: III  Anesthesia Plan: Spinal   Post-op Pain Management:    Induction: Intravenous  PONV Risk Score and Plan: Propofol infusion  Airway Management Planned: Oral ETT  Additional Equipment:   Intra-op Plan:   Post-operative Plan:   Informed Consent: I have reviewed the patients History and Physical, chart, labs and discussed the procedure including the risks, benefits and alternatives for the proposed anesthesia with the patient or authorized  representative who has indicated his/her understanding and acceptance.     Dental Advisory Given  Plan Discussed with: Anesthesiologist and CRNA  Anesthesia Plan Comments:        Anesthesia Quick Evaluation

## 2019-01-28 NOTE — Progress Notes (Signed)
[facility] [place] NAME: Kimberly Hudson    MR#:  [id number]  DATE OF BIRTH:  1956/09/16  DATE OF ADMISSION:  01/27/2019  PRIMARY CARE PHYSICIAN: Birdie Sons, MD   REQUESTING/REFERRING PHYSICIAN: Merlyn Lot, MD  CHIEF COMPLAINT:   Chief Complaint  Patient presents with   Fall    HISTORY OF PRESENT ILLNESS:  Kimberly Hudson  is a 63 y.o. female with a known history of Crohns disease (Imuran) p/w generalized weakness, mechanical fall, R intertrochanteric hip Fx, profound hypokalemia. When I saw her at the time of admission yesterday (Wed 01/27/2019) evening, she was in moderate to severe distress 2/2 R hip pain, and was largely unable to answer my questions. On present examination (performed Thurs 01/28/2019 @~2030PM), pt is awake, alert and states she is feeling much better. She tells me her pain is adequately controlled. She still feels weak, as expected. Her nurse tells me that she had just arrived back to the room from the OR immediately prior to my examination. She is POD #0 s/p R femur IM nail, performed under spinal anesthesia. Surgeon was Dr. Leim Fabry. She is ordered for incentive spirometry. K+ was < 2.0 on admission labwork, and has improved to 3.2 today.  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   Wears dentures    full upper    PAST SURGICAL HISTORY:   Past Surgical History:  Procedure Laterality Date   COLONOSCOPY  02/2013   No polyps were found   ESOPHAGEAL DILATION  05/29/2017   Procedure: ESOPHAGEAL DILATION;  Surgeon: Lucilla Lame, MD;  Location: [facility];  Service: Endoscopy;;   ESOPHAGOGASTRODUODENOSCOPY (EGD) WITH PROPOFOL N/A 06/28/2015   Procedure: ESOPHAGOGASTRODUODENOSCOPY (EGD) WITH PROPOFOL;  Surgeon: Lucilla Lame, MD;  Location: [place];  Service: Endoscopy;  Laterality: N/A;  with dialation   ESOPHAGOGASTRODUODENOSCOPY (EGD) WITH PROPOFOL N/A 07/26/2015   Procedure: ESOPHAGOGASTRODUODENOSCOPY (EGD) WITH PROPOFOL, with dialation;  Surgeon: Lucilla Lame, MD;  Location: [facility];  Service: Endoscopy;  Laterality: N/A;  LEAVE PT LAST   ESOPHAGOGASTRODUODENOSCOPY (EGD) WITH PROPOFOL N/A 08/05/2016   Procedure: ESOPHAGOGASTRODUODENOSCOPY (EGD) WITH PROPOFOL;  Surgeon: Lucilla Lame, MD;  Location: [place];  Service: Endoscopy;  Laterality: N/A;   ESOPHAGOGASTRODUODENOSCOPY (EGD) WITH PROPOFOL N/A 09/26/2016   Procedure: ESOPHAGOGASTRODUODENOSCOPY (EGD) WITH PROPOFOL;  Surgeon: Lucilla Lame, MD;  Location: [place];  Service: Endoscopy;  Laterality: N/A;   ESOPHAGOGASTRODUODENOSCOPY (EGD) WITH PROPOFOL N/A 05/29/2017   Procedure: ESOPHAGOGASTRODUODENOSCOPY (EGD) WITH PROPOFOL - Injection of steroid in esophagus;  Surgeon: Lucilla Lame, MD;  Location: [place];  Service: Endoscopy;  Laterality: N/A;   Excision BCC  07/28/2013   right nares, Georgiann Cocker   TUBAL LIGATION  1986   UPPER GI ENDOSCOPY  02/2014    SOCIAL HISTORY:   Social History   Tobacco Use   Smoking status: Former Smoker    Packs/day: 1.00    Years: 10.00  Pack years: 10.00    Types: Cigarettes    Last attempt to quit: 11/18/2012    Years since quitting: 6.1   Smokeless tobacco: Never Used   Tobacco comment: smoked up to 1 ppd for about 10 years.   Substance Use Topics   Alcohol use: Not Currently    Alcohol/week: 0.0 standard drinks    Comment: every once in a while    FAMILY HISTORY:   Family History  Problem  Relation Age of Onset   COPD Mother    Heart disease Mother    COPD Father    Emphysema Father    Prostate cancer Father    Alcohol abuse Father    Anxiety disorder Father    Depression Father    Schizophrenia Father    COPD Sister    Heart disease Sister    Alcohol abuse Sister    Anxiety disorder Sister    Depression Sister    Alcohol abuse Brother    Alcohol abuse Sister    Anxiety disorder Sister    Depression Sister    Alcohol abuse Sister    Anxiety disorder Sister    Depression Sister    Anxiety disorder Sister    Depression Sister    Anxiety disorder Sister    Depression Sister    Drug abuse Son    Drug abuse Other    Drug abuse Other    Drug abuse Other     DRUG ALLERGIES:   Allergies  Allergen Reactions   Nsaids Other (See Comments)    Because of Crohns Because of Crohns   Sulfa Antibiotics Rash    REVIEW OF SYSTEMS:   Review of Systems  Constitutional: Positive for malaise/fatigue. Negative for chills, diaphoresis, fever and weight loss.  HENT: Negative for congestion, ear pain, hearing loss, nosebleeds, sinus pain, sore throat and tinnitus.   Eyes: Negative for blurred vision, double vision and photophobia.  Respiratory: Negative for cough, hemoptysis, sputum production, shortness of breath and wheezing.   Cardiovascular: Negative for chest pain, palpitations, orthopnea, claudication, leg swelling and PND.  Gastrointestinal: Negative for abdominal pain, blood in stool, constipation, diarrhea, heartburn, melena, nausea and vomiting.  Genitourinary: Negative for dysuria, frequency, hematuria and urgency.  Musculoskeletal: Positive for falls and joint pain. Negative for back pain, myalgias and neck pain.  Skin: Negative for itching and rash.  Neurological: Positive for weakness. Negative for dizziness, tingling, tremors, sensory change, speech change, focal weakness, seizures, loss of consciousness and headaches.    Psychiatric/Behavioral: Negative for depression and memory loss. The patient is not nervous/anxious and does not have insomnia.    Pain improved. MEDICATIONS AT HOME:   Prior to Admission medications   Medication Sig Start Date End Date Taking? Authorizing Provider  azaTHIOprine (IMURAN) 50 MG tablet Take 3 tablets (150 mg total) by mouth daily. 12/03/18  Yes Lucilla Lame, MD  clonazePAM (KLONOPIN) 1 MG tablet Take 0.5-1 tablets (0.5-1 mg total) by mouth as directed. Take half tablet at bedtime tuesdays and saturdays and continue 1 tablet rest of the days 09/30/18  Yes Eappen, Saramma, MD  FLUoxetine (PROZAC) 40 MG capsule TAKE 1 CAPSULE BY MOUTH TWICE A DAY Patient taking differently: Take 80 mg by mouth daily.  05/21/18  Yes Birdie Sons, MD  fluticasone (FLONASE) 50 MCG/ACT nasal spray Place 2 sprays into both nostrils daily. 02/16/18  Yes Birdie Sons, MD  HYDROcodone-acetaminophen (NORCO) 7.5-325 MG tablet Take 1 tablet by mouth every 6 (six) hours as needed  for moderate pain. 01/15/19  Yes Birdie Sons, MD  lisinopril-hydrochlorothiazide (PRINZIDE,ZESTORETIC) 10-12.5 MG tablet TAKE 1 TABLET BY MOUTH DAILY. Patient taking differently: Take 0.5 tablets by mouth daily.  12/29/17  Yes Birdie Sons, MD  montelukast (SINGULAIR) 10 MG tablet TAKE 1 TABLET BY MOUTH EVERYDAY AT BEDTIME 08/21/18  Yes Birdie Sons, MD  pantoprazole (PROTONIX) 40 MG tablet TAKE 1 TABLET BY MOUTH TWICE A DAY 01/05/19  Yes Lucilla Lame, MD  SYMBICORT 80-4.5 MCG/ACT inhaler TAKE 2 PUFFS BY MOUTH TWICE A DAY 04/27/18  Yes Birdie Sons, MD  cyclobenzaprine (FLEXERIL) 5 MG tablet TAKE 1 TABLET BY MOUTH THREE TIMES A DAY AS NEEDED FOR MUSCLE SPASMS 12/20/18   Birdie Sons, MD  dicyclomine (BENTYL) 20 MG tablet TAKE 1 TABLET (20 MG TOTAL) BY MOUTH 3 (THREE) TIMES DAILY BEFORE MEALS. Patient not taking: Reported on 01/28/2019 12/19/17   Lucilla Lame, MD  diphenoxylate-atropine (LOMOTIL) 2.5-0.025 MG tablet Take  1 tablet by mouth 4 (four) times daily as needed for diarrhea or loose stools. 05/30/17   Lucilla Lame, MD  doxycycline (VIBRA-TABS) 100 MG tablet Take 1 tablet (100 mg total) by mouth 2 (two) times daily. Patient not taking: Reported on 01/28/2019 09/26/18   Jerrol Banana., MD  fluconazole (DIFLUCAN) 100 MG tablet TAKE 1 TABLET BY MOUTH EVERY DAY Patient not taking: Reported on 01/28/2019 01/18/19   Birdie Sons, MD  mirtazapine (REMERON) 7.5 MG tablet TAKE 1 TABLET (7.5 MG TOTAL) BY MOUTH AT BEDTIME. FOR SLEEP Patient not taking: Reported on 01/28/2019 11/25/18   Ursula Alert, MD  oseltamivir (TAMIFLU) 75 MG capsule Take 1 capsule (75 mg total) by mouth 2 (two) times daily. Patient not taking: Reported on 01/28/2019 01/19/19   Chevis Pretty, FNP  pravastatin (PRAVACHOL) 10 MG tablet TAKE 1 TABLET BY MOUTH EVERY DAY 01/18/19   Birdie Sons, MD  promethazine (PHENERGAN) 25 MG tablet TAKE 1 TABLET BY MOUTH EVERY 6 HOURS AS NEEDED FOR NAUSEA AND VOMITING 12/03/18   Lucilla Lame, MD  promethazine-dextromethorphan (PROMETHAZINE-DM) 6.25-15 MG/5ML syrup Take 5 mLs by mouth 4 (four) times daily as needed for cough. Patient not taking: Reported on 01/28/2019 09/26/18   Jerrol Banana., MD  rOPINIRole (REQUIP) 0.5 MG tablet TAKE 1/2 TAB AT BEDTIME X3 DAYS, THEN 1 AT BEDTIME X3 DAYS, THEN INCREASE TO 2 AT BEDTIME IF NEEDED. 10/02/18   Birdie Sons, MD      VITAL SIGNS:  Blood pressure (!) 100/55, pulse 81, temperature 98.8 F (37.1 C), temperature source Oral, resp. rate 16, height 5' 7"  (1.702 m), weight 88.5 kg, SpO2 100 %.  PHYSICAL EXAMINATION:  Physical Exam Constitutional:      General: She is not in acute distress.    Appearance: She is not ill-appearing, toxic-appearing or diaphoretic.  HENT:     Head: Atraumatic.     Mouth/Throat:     Pharynx: Oropharynx is clear.  Eyes:     General: No scleral icterus.    Extraocular Movements: Extraocular movements intact.      Conjunctiva/sclera: Conjunctivae normal.  Neck:     Musculoskeletal: Neck supple.  Cardiovascular:     Rate and Rhythm: Normal rate and regular rhythm.     Heart sounds: Normal heart sounds. No murmur. No friction rub. No gallop.   Pulmonary:     Effort: No respiratory distress.     Breath sounds: Normal breath sounds. No stridor. No wheezing or rales.  Abdominal:  General: There is no distension.     Palpations: Abdomen is soft.     Tenderness: There is no abdominal tenderness. There is no guarding or rebound.  Musculoskeletal:        General: Tenderness present.     Right lower leg: No edema.     Left lower leg: No edema.  Lymphadenopathy:     Cervical: No cervical adenopathy.  Skin:    General: Skin is warm and dry.     Findings: No erythema or rash.  Neurological:     Mental Status: She is alert and oriented to person, place, and time. Mental status is at baseline.  Psychiatric:        Mood and Affect: Mood normal.        Behavior: Behavior normal.        Thought Content: Thought content normal.        Judgment: Judgment normal.   Comfortable, pain improved (adequately controlled), not in distress. LABORATORY PANEL:   CBC Recent Labs  Lab 01/27/19 2247  WBC 3.4*  HGB 9.2*  HCT 26.9*  PLT 290   ------------------------------------------------------------------------------------------------------------------  Chemistries  Recent Labs  Lab 01/27/19 2247 01/28/19 0031 01/28/19 0847  NA 137  --  137  K <2.0*  --  3.2*  CL 96*  --  99  CO2 27  --  29  GLUCOSE 122*  --  117*  BUN 9  --  10  CREATININE 0.92  --  0.97  CALCIUM 7.6*  --  7.9*  MG  --  1.6*  --   AST 48*  --   --   ALT 18  --   --   ALKPHOS 154*  --   --   BILITOT 1.0  --   --    ------------------------------------------------------------------------------------------------------------------  Cardiac Enzymes No results for input(s): TROPONINI in the last 168  hours. ------------------------------------------------------------------------------------------------------------------  RADIOLOGY:  Dg Chest 1 View  Result Date: 01/27/2019 CLINICAL DATA:  Right hip pain pain after fall. EXAM: CHEST  1 VIEW COMPARISON:  Report from 12/05/2008 FINDINGS: The lung volumes are low. The cardiopericardial silhouette is mildly enlarged in part due to the AP projection. No acute pulmonary consolidation, effusion or pneumothorax. Retrocardiac opacities may reflect atelectasis. No acute displaced fracture. No abnormal fluid collections. IMPRESSION: No active disease. Mildly enlarged cardiopericardial silhouette in part due to the AP projection. Electronically Signed   By: Ashley Royalty M.D.   On: 01/27/2019 23:46   Dg Pelvis 1-2 Views  Result Date: 01/27/2019 CLINICAL DATA:  Right hip pain and deformity. EXAM: PELVIS - 1-2 VIEW COMPARISON:  None. FINDINGS: There is an acute, comminuted, varus angulated closed fracture of the proximal left femur involving the basicervical portion of the femur. This fracture undermines the greater trochanter in exits in the subtrochanteric portion of the femur. Avulsed lesser trochanter is also suggested. No joint dislocation. No pelvic diastasis. Lower lumbar degenerative disc and endplate changes are noted from L3 through S1. The native left hip is intact with degenerative joint space narrowing. IMPRESSION: Acute, varus angulated, basicervical fracture of the right femur extending through the intertrochanteric portion of the femur and undermining the greater trochanter. Avulsed lesser trochanter is also seen. Electronically Signed   By: Ashley Royalty M.D.   On: 01/27/2019 23:52   Dg Hip Operative Unilat W Or W/o Pelvis Right  Result Date: 01/28/2019 CLINICAL DATA:  Intraoperative imaging for ORIF of a proximal right femur fracture. EXAM: OPERATIVE RIGHT HIP (WITH PELVIS  IF PERFORMED) 9 VIEWS TECHNIQUE: Fluoroscopic spot image(s) were submitted  for interpretation post-operatively. COMPARISON:  01/27/2019 FINDINGS: Images show placement of a long intramedullary rod supporting a compression screw, reducing the primary fracture components of the displaced, comminuted intertrochanteric fracture into near anatomic alignment. The orthopedic hardware appears well seated. IMPRESSION: Well aligned proximal right femur fracture following ORIF. Electronically Signed   By: Lajean Manes M.D.   On: 01/28/2019 17:30   Dg Femur Min 2 Views Right  Result Date: 01/27/2019 CLINICAL DATA:  Right hip pain and deformity EXAM: RIGHT FEMUR 2 VIEWS COMPARISON:  None. FINDINGS: There is an acute, comminuted, varus angulated closed fracture of the proximal left femur involving the basicervical portion of the femur. This fracture undermines the greater trochanter in exits in the subtrochanteric portion of the femur. Avulsed lesser trochanter is also suggested. No joint dislocation. The included pubic rami appear intact. No pelvic diastasis is seen. Osteopenic appearance of the included knee with tricompartmental osteoarthritis and spurring noted. No joint effusion is identified though the study is somewhat limited due to obliquity. If the patient is symptomatic over the knee, dedicated knee radiographs are recommended for better assessment. IMPRESSION: Acute, varus angulated, basicervical fracture of the right femur extending through the intertrochanteric portion of the femur and undermining the greater trochanter. Avulsed lesser trochanter is also seen. Electronically Signed   By: Ashley Royalty M.D.   On: 01/27/2019 23:49    IMPRESSION AND PLAN:   A/P: 86F w/ PMHx Crohns disease (Imuran) p/w generalized weakness, mechanical fall, R intertrochanteric hip Fx. Pt now POD #0 s/p R femur IM nail, performed under spinal anesthesia. Profound hypokalemia on admission (K+ < 2.0), improving. -Generalized weakness, mechanical fall, R intertrochanteric hip Fx: Generalized weakness  likely 2/2 recent illness, chronic diarrhea, electrolyte abnl (profound hypokalemia). These issues are discussed separately below. s/p fall. Imaging demonstrates, Acute, varus angulated, basicervical fracture of the right femur extending through the intertrochanteric portion of the femur and undermining the greater trochanter. Avulsed lesser trochanter is also seen. Pt now POD #0 s/p R femur IM nail, performed under spinal anesthesia. Orthopedic Surgeon is Dr. Leim Fabry. P/T, pain ctrl. Incentive spirometry. -Profound hypokalemia, Crohns disease, chronic diarrhea, hypomagnesemia, hypophosphatemia: K+ < 2.0 on admission, improved to 3.2. Mag 1.6, Phos 2.2. Replete and monitor. Pt has not had diarrhea since admission. Imuran resumed. -Hypocalcemia: Ionized calcium pending. -Hypoproteinemia, hypoalbuminemia: Prealbumin low (16.1). Nutrition consult.  -Leukopenia w/o neutropenia, macrocytic anemia: WBC 3.4, ANC 2500. Hgb 9.2, MCV 113.0. Folate normal. B12 low (117). B12 injection ordered. -c/w other home meds/formulary subs as tolerated. -FEN/GI: Regular diet as tolerated. -DVT PPx: Lovenox. -Code status: Full code. -Disposition: Admission, > 2 midnights.   All the records are reviewed and case discussed with ED provider. Management plans discussed with the patient, family and they are in agreement.  CODE STATUS: Full code.  TOTAL TIME TAKING CARE OF THIS PATIENT: 30 minutes.    Arta Silence M.D on 01/28/2019 at 8:53 PM  Between 7am to 6pm - Pager - [phone]  After 6pm go to [email or web address] - Proofreader  [facility]  Office  [phone]  CC: Primary care physician; Birdie Sons, MD   Note: This dictation was prepared with Dragon dictation along with smaller phrase technology. Any transcriptional errors that result from this process are unintentional.

## 2019-01-28 NOTE — ED Notes (Signed)
OR here to transport pt to OR. NS with K+ here via tube station and sent up to OR with pt.

## 2019-01-28 NOTE — ED Notes (Signed)
Pt requesting her daughter Colletta [place] to be called post surgery. [phone].

## 2019-01-28 NOTE — ED Notes (Addendum)
Yellow fall band and yellow fall socks applied. Upper dentures placed in water in denture cup, labeled with pt name and placed in white hospital bag, pt states her lower dentures are at home. Brown short sleeve shirt removed and placed in belongings bag. 4 small earrings, 2 silver bracelets, 4 silver rings, and one silver necklace placed in clear cup, labeled with pt name and placed in white pt belongings bag. Pt states her underwear are in the bag as well. Reminded pt that her khaki shorts and black sports bra were cut off and thrown away.

## 2019-01-28 NOTE — H&P (Signed)
[facility] NAME: Kimberly Hudson    MR#:  [id number]  DATE OF BIRTH:  Nov 17, 1956  DATE OF ADMISSION:  01/27/2019  PRIMARY CARE PHYSICIAN: Birdie Sons, MD   REQUESTING/REFERRING PHYSICIAN: Merlyn Lot, MD  CHIEF COMPLAINT:   Chief Complaint  Patient presents with   Fall    HISTORY OF PRESENT ILLNESS:  Kimberly Hudson  is a 63 y.o. female with a known history of Crohns disease (Imuran) p/w generalized weakness, mechanical fall, R intertrochanteric hip Fx, profound hypokalemia. Pt AAOx3, but in moderate to severe distress 2/2 R hip pain at the time of my assessment. She endorses chronic diarrhea, generalized weakness and acute R hip pain (w/ muscle spasms) 2/2 fall/trauma, but is otherwise w/o complaint. She directs me to obtain Hx from her daughter. Pt apparently w/ severe DJD/OA, pending B/L knee replacements; walks w/ cane. Pt lives at home w/ son and daughter, no stairs inside house. Pt was walking from kitchen to bedroom, and apparently slipped and fell forward. She tried to grab a chair to steady herself, but was unable to regain balance. She fell on her R knee and hip. She developed pain immediately. Imaging demonstrates, Acute, varus angulated, basicervical fracture of the right femur extending through the intertrochanteric portion of the femur and undermining the greater trochanter. Avulsed lesser trochanter is also seen. K+ found to be < 2.0. Pts daughter tells me pt has been ill for 3-4wks, but has also had chronic diarrhea for years. She sees Dr. Allen Norris, and has apparently been on Imuran for thirty years, though this does not seem to be effective in remedying pts diarrhea. Pts daughter states pt is hard-headed and does not want to change therapy. I am concerned that this is leading to malabsorption, malnourishment and severe GI electrolyte losses, which are likely to persist unless/until the underlying pathology is  treated. I am also concerned that malnourishment/hypoalbuminemia may potentially impair wound-healing and affect a potential perioperative course.  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   Wears dentures    full upper    PAST SURGICAL HISTORY:   Past Surgical History:  Procedure Laterality Date   COLONOSCOPY  02/2013   No polyps were found   ESOPHAGEAL DILATION  05/29/2017   Procedure: ESOPHAGEAL DILATION;  Surgeon: Lucilla Lame, MD;  Location: [facility];  Service: Endoscopy;;   ESOPHAGOGASTRODUODENOSCOPY (EGD) WITH PROPOFOL N/A 06/28/2015   Procedure: ESOPHAGOGASTRODUODENOSCOPY (EGD) WITH PROPOFOL;  Surgeon: Lucilla Lame, MD;  Location: [facility];  Service: Endoscopy;  Laterality: N/A;  with dialation   ESOPHAGOGASTRODUODENOSCOPY (EGD) WITH PROPOFOL N/A 07/26/2015   Procedure: ESOPHAGOGASTRODUODENOSCOPY (EGD) WITH PROPOFOL, with dialation;  Surgeon: Lucilla Lame, MD;  Location: [place];  Service: Endoscopy;  Laterality: N/A;  LEAVE PT LAST   ESOPHAGOGASTRODUODENOSCOPY (EGD) WITH PROPOFOL N/A 08/05/2016   Procedure: ESOPHAGOGASTRODUODENOSCOPY (EGD) WITH PROPOFOL;  Surgeon: Lucilla Lame, MD;  Location: [place];  Service: Endoscopy;  Laterality: N/A;   ESOPHAGOGASTRODUODENOSCOPY (EGD) WITH PROPOFOL N/A 09/26/2016   Procedure: ESOPHAGOGASTRODUODENOSCOPY (EGD) WITH PROPOFOL;  Surgeon: Lucilla Lame, MD;  Location: [place];  Service: Endoscopy;  Laterality: N/A;   ESOPHAGOGASTRODUODENOSCOPY (EGD) WITH PROPOFOL N/A  05/29/2017   Procedure: ESOPHAGOGASTRODUODENOSCOPY (EGD) WITH PROPOFOL - Injection of steroid in esophagus;  Surgeon: Lucilla Lame, MD;  Location: [facility];  Service: Endoscopy;  Laterality: N/A;   Excision BCC  07/28/2013   right nares, Beth Goldstein   TUBAL LIGATION  1986   UPPER GI ENDOSCOPY  02/2014    SOCIAL HISTORY:   Social History   Tobacco Use   Smoking status: Former Smoker    Packs/day: 1.00    Years: 10.00    Pack years: 10.00    Types: Cigarettes    Last attempt to quit: 11/18/2012    Years since quitting: 6.1   Smokeless tobacco: Never Used   Tobacco comment: smoked up to 1 ppd for about 10 years.   Substance Use Topics   Alcohol use: Not Currently    Alcohol/week: 0.0 standard drinks    Comment: every once in a while    FAMILY HISTORY:   Family History  Problem Relation Age of Onset   COPD Mother    Heart disease Mother    COPD Father    Emphysema Father    Prostate cancer Father    Alcohol abuse Father    Anxiety disorder Father    Depression Father    Schizophrenia Father    COPD Sister    Heart disease Sister    Alcohol abuse Sister    Anxiety disorder Sister    Depression Sister    Alcohol abuse Brother    Alcohol abuse Sister    Anxiety disorder Sister    Depression Sister    Alcohol abuse Sister    Anxiety disorder Sister    Depression Sister    Anxiety disorder Sister    Depression Sister    Anxiety disorder Sister    Depression Sister    Drug abuse Son    Drug abuse Other    Drug abuse Other    Drug abuse Other     DRUG ALLERGIES:   Allergies  Allergen Reactions   Nsaids Other (See Comments)    Because of Crohns Because of Crohns   Sulfa Antibiotics Rash    REVIEW OF SYSTEMS:   Review of Systems  Unable to perform ROS: Severity of pain  Constitutional: Positive for malaise/fatigue. Negative for chills, diaphoresis and fever.  Respiratory: Negative for shortness of breath.    Cardiovascular: Negative for chest pain.  Gastrointestinal: Positive for diarrhea. Negative for abdominal pain, nausea and vomiting.  Musculoskeletal: Positive for falls and joint pain.  Neurological: Positive for weakness. Negative for dizziness.   MEDICATIONS AT HOME:   Prior to Admission medications   Medication Sig Start Date End Date Taking? Authorizing Provider  azaTHIOprine (IMURAN) 50 MG tablet Take 3 tablets (150 mg total) by mouth daily. 12/03/18  Yes Lucilla Lame, MD  clonazePAM (KLONOPIN) 1 MG tablet Take 0.5-1 tablets (0.5-1 mg total) by mouth as directed. Take half tablet at bedtime tuesdays and saturdays and continue 1 tablet rest of the days 09/30/18  Yes Eappen, Saramma, MD  FLUoxetine (PROZAC) 40 MG capsule TAKE 1 CAPSULE BY MOUTH TWICE A DAY Patient taking differently: Take 80 mg by mouth daily.  05/21/18  Yes Birdie Sons, MD  fluticasone (FLONASE) 50 MCG/ACT nasal spray Place 2  sprays into both nostrils daily. 02/16/18  Yes Birdie Sons, MD  HYDROcodone-acetaminophen (NORCO) 7.5-325 MG tablet Take 1 tablet by mouth every 6 (six) hours as needed for moderate pain. 01/15/19  Yes Birdie Sons, MD  lisinopril-hydrochlorothiazide (PRINZIDE,ZESTORETIC) 10-12.5 MG tablet TAKE 1 TABLET BY MOUTH DAILY. Patient taking differently: Take 0.5 tablets by mouth daily.  12/29/17  Yes Birdie Sons, MD  montelukast (SINGULAIR) 10 MG tablet TAKE 1 TABLET BY MOUTH EVERYDAY AT BEDTIME 08/21/18  Yes Birdie Sons, MD  pantoprazole (PROTONIX) 40 MG tablet TAKE 1 TABLET BY MOUTH TWICE A DAY 01/05/19  Yes Lucilla Lame, MD  SYMBICORT 80-4.5 MCG/ACT inhaler TAKE 2 PUFFS BY MOUTH TWICE A DAY 04/27/18  Yes Birdie Sons, MD  cyclobenzaprine (FLEXERIL) 5 MG tablet TAKE 1 TABLET BY MOUTH THREE TIMES A DAY AS NEEDED FOR MUSCLE SPASMS 12/20/18   Birdie Sons, MD  dicyclomine (BENTYL) 20 MG tablet TAKE 1 TABLET (20 MG TOTAL) BY MOUTH 3 (THREE) TIMES DAILY BEFORE MEALS. Patient not taking:  Reported on 01/28/2019 12/19/17   Lucilla Lame, MD  diphenoxylate-atropine (LOMOTIL) 2.5-0.025 MG tablet Take 1 tablet by mouth 4 (four) times daily as needed for diarrhea or loose stools. 05/30/17   Lucilla Lame, MD  doxycycline (VIBRA-TABS) 100 MG tablet Take 1 tablet (100 mg total) by mouth 2 (two) times daily. Patient not taking: Reported on 01/28/2019 09/26/18   Jerrol Banana., MD  fluconazole (DIFLUCAN) 100 MG tablet TAKE 1 TABLET BY MOUTH EVERY DAY Patient not taking: Reported on 01/28/2019 01/18/19   Birdie Sons, MD  mirtazapine (REMERON) 7.5 MG tablet TAKE 1 TABLET (7.5 MG TOTAL) BY MOUTH AT BEDTIME. FOR SLEEP Patient not taking: Reported on 01/28/2019 11/25/18   Ursula Alert, MD  oseltamivir (TAMIFLU) 75 MG capsule Take 1 capsule (75 mg total) by mouth 2 (two) times daily. Patient not taking: Reported on 01/28/2019 01/19/19   Chevis Pretty, FNP  pravastatin (PRAVACHOL) 10 MG tablet TAKE 1 TABLET BY MOUTH EVERY DAY 01/18/19   Birdie Sons, MD  promethazine (PHENERGAN) 25 MG tablet TAKE 1 TABLET BY MOUTH EVERY 6 HOURS AS NEEDED FOR NAUSEA AND VOMITING 12/03/18   Lucilla Lame, MD  promethazine-dextromethorphan (PROMETHAZINE-DM) 6.25-15 MG/5ML syrup Take 5 mLs by mouth 4 (four) times daily as needed for cough. Patient not taking: Reported on 01/28/2019 09/26/18   Jerrol Banana., MD  rOPINIRole (REQUIP) 0.5 MG tablet TAKE 1/2 TAB AT BEDTIME X3 DAYS, THEN 1 AT BEDTIME X3 DAYS, THEN INCREASE TO 2 AT BEDTIME IF NEEDED. 10/02/18   Birdie Sons, MD      VITAL SIGNS:  Blood pressure (!) 100/55, pulse 81, temperature 98.8 F (37.1 C), temperature source Oral, resp. rate 16, height 5' 7"  (1.702 m), weight 88.5 kg, SpO2 100 %.  PHYSICAL EXAMINATION:  Physical Exam Constitutional:      General: She is in acute distress.     Appearance: She is ill-appearing. She is not toxic-appearing or diaphoretic.  HENT:     Head: Atraumatic.     Mouth/Throat:     Pharynx: Oropharynx is  clear.  Eyes:     General: No scleral icterus.    Extraocular Movements: Extraocular movements intact.     Conjunctiva/sclera: Conjunctivae normal.  Neck:     Musculoskeletal: Neck supple.  Cardiovascular:     Rate and Rhythm: Normal rate and regular rhythm.     Heart sounds: Normal heart sounds. No murmur. No friction rub.  No gallop.   Pulmonary:     Effort: No respiratory distress.     Breath sounds: Normal breath sounds. No stridor. No wheezing, rhonchi or rales.  Abdominal:     General: There is no distension.     Palpations: Abdomen is soft.     Tenderness: There is no abdominal tenderness. There is no guarding or rebound.  Musculoskeletal:        General: Tenderness present.     Right lower leg: No edema.     Left lower leg: No edema.  Skin:    General: Skin is warm and dry.     Findings: No erythema or rash.  Neurological:     Mental Status: She is alert and oriented to person, place, and time.  Psychiatric:        Attention and Perception: Attention and perception normal.        Mood and Affect: Mood and affect normal.        Speech: Speech normal.        Behavior: Behavior normal.        Thought Content: Thought content normal.        Cognition and Memory: Cognition and memory normal.        Judgment: Judgment normal.   Moderate to severe distress 2/2 R hip pain. RLE shortened and externally rotated, (-) ROM, (+) TTP. LABORATORY PANEL:   CBC Recent Labs  Lab 01/27/19 2247  WBC 3.4*  HGB 9.2*  HCT 26.9*  PLT 290   ------------------------------------------------------------------------------------------------------------------  Chemistries  Recent Labs  Lab 01/27/19 2247 01/28/19 0031 01/28/19 0847  NA 137  --  137  K <2.0*  --  3.2*  CL 96*  --  99  CO2 27  --  29  GLUCOSE 122*  --  117*  BUN 9  --  10  CREATININE 0.92  --  0.97  CALCIUM 7.6*  --  7.9*  MG  --  1.6*  --   AST 48*  --   --   ALT 18  --   --   ALKPHOS 154*  --   --   BILITOT  1.0  --   --    ------------------------------------------------------------------------------------------------------------------  Cardiac Enzymes No results for input(s): TROPONINI in the last 168 hours. ------------------------------------------------------------------------------------------------------------------  RADIOLOGY:  Dg Chest 1 View  Result Date: 01/27/2019 CLINICAL DATA:  Right hip pain pain after fall. EXAM: CHEST  1 VIEW COMPARISON:  Report from 12/05/2008 FINDINGS: The lung volumes are low. The cardiopericardial silhouette is mildly enlarged in part due to the AP projection. No acute pulmonary consolidation, effusion or pneumothorax. Retrocardiac opacities may reflect atelectasis. No acute displaced fracture. No abnormal fluid collections. IMPRESSION: No active disease. Mildly enlarged cardiopericardial silhouette in part due to the AP projection. Electronically Signed   By: Ashley Royalty M.D.   On: 01/27/2019 23:46   Dg Pelvis 1-2 Views  Result Date: 01/27/2019 CLINICAL DATA:  Right hip pain and deformity. EXAM: PELVIS - 1-2 VIEW COMPARISON:  None. FINDINGS: There is an acute, comminuted, varus angulated closed fracture of the proximal left femur involving the basicervical portion of the femur. This fracture undermines the greater trochanter in exits in the subtrochanteric portion of the femur. Avulsed lesser trochanter is also suggested. No joint dislocation. No pelvic diastasis. Lower lumbar degenerative disc and endplate changes are noted from L3 through S1. The native left hip is intact with degenerative joint space narrowing. IMPRESSION: Acute, varus angulated, basicervical fracture of the  right femur extending through the intertrochanteric portion of the femur and undermining the greater trochanter. Avulsed lesser trochanter is also seen. Electronically Signed   By: Ashley Royalty M.D.   On: 01/27/2019 23:52   Dg Hip Operative Unilat W Or W/o Pelvis Right  Result Date:  01/28/2019 CLINICAL DATA:  Intraoperative imaging for ORIF of a proximal right femur fracture. EXAM: OPERATIVE RIGHT HIP (WITH PELVIS IF PERFORMED) 9 VIEWS TECHNIQUE: Fluoroscopic spot image(s) were submitted for interpretation post-operatively. COMPARISON:  01/27/2019 FINDINGS: Images show placement of a long intramedullary rod supporting a compression screw, reducing the primary fracture components of the displaced, comminuted intertrochanteric fracture into near anatomic alignment. The orthopedic hardware appears well seated. IMPRESSION: Well aligned proximal right femur fracture following ORIF. Electronically Signed   By: Lajean Manes M.D.   On: 01/28/2019 17:30   Dg Femur Min 2 Views Right  Result Date: 01/27/2019 CLINICAL DATA:  Right hip pain and deformity EXAM: RIGHT FEMUR 2 VIEWS COMPARISON:  None. FINDINGS: There is an acute, comminuted, varus angulated closed fracture of the proximal left femur involving the basicervical portion of the femur. This fracture undermines the greater trochanter in exits in the subtrochanteric portion of the femur. Avulsed lesser trochanter is also suggested. No joint dislocation. The included pubic rami appear intact. No pelvic diastasis is seen. Osteopenic appearance of the included knee with tricompartmental osteoarthritis and spurring noted. No joint effusion is identified though the study is somewhat limited due to obliquity. If the patient is symptomatic over the knee, dedicated knee radiographs are recommended for better assessment. IMPRESSION: Acute, varus angulated, basicervical fracture of the right femur extending through the intertrochanteric portion of the femur and undermining the greater trochanter. Avulsed lesser trochanter is also seen. Electronically Signed   By: Ashley Royalty M.D.   On: 01/27/2019 23:49   IMPRESSION AND PLAN:   A/P: 70F w/ PMHx Crohns disease (Imuran) p/w generalized weakness, mechanical fall, R intertrochanteric hip Fx, profound  hypokalemia. Hypochloremia, hyperglycemia, hypocalcemia, transaminasemia, hypoproteinemia/hypoalbuminemia, leukopenia (w/o neutropenia), macrocytic anemia. -Generalized weakness, mechanical fall, R intertrochanteric hip Fx: Generalized weakness likely 2/2 recent illness, chronic diarrhea, electrolyte abnl (profound hypokalemia). These issues are discussed separately below. s/p fall. Imaging demonstrates, Acute, varus angulated, basicervical fracture of the right femur extending through the intertrochanteric portion of the femur and undermining the greater trochanter. Avulsed lesser trochanter is also seen. Orthopedic surgery consult (Dr. Leim Fabry). NPO. Pain ctrl. -Profound hypokalemia, Crohns disease, chronic diarrhea: K+ < 2.0. Replete aggressively (IV + PO), recheck. Mag + Phos levels pending, also expected to be low. Expect hypokalemia to prevent administration of succinylcholine and induction of general anesthesia. QTc 633. Cardiac monitoring. I am concerned that chronic diarrhea is leading to malabsorption, malnourishment and severe GI electrolyte losses, which are likely to persist unless/until the underlying pathology is treated. GI consult. -Hypochloremia: Volume contracted. K+-containing IVF. -Hypocalcemia: Ionized calcium. -Hypoproteinemia, hypoalbuminemia: Suspect malabsorption w/ malnourishment. Prealbumin. Nutrition consult.  -Leukopenia w/o neutropenia, macrocytic anemia: WBC 3.4, ANC 2500. Hgb 9.2, MCV 113.0. B12, Folate pending. -c/w other home meds/formulary subs as tolerated. -FEN/GI: NPO. -DVT PPx: Lovenox. -Code status: Full code. -Disposition: Admission, > 2 midnights.   All the records are reviewed and case discussed with ED provider. Management plans discussed with the patient, family and they are in agreement.  CODE STATUS: Full code.  TOTAL TIME TAKING CARE OF THIS PATIENT: 75 minutes.    Arta Silence M.D on 01/28/2019 at 8:09 PM  Between 7am to 6pm -  Pager -  [phone]  After 6pm go to [email or web address] - Proofreader  [facility]  Office  [phone]  CC: Primary care physician; Birdie Sons, MD   Note: This dictation was prepared with Dragon dictation along with smaller phrase technology. Any transcriptional errors that result from this process are unintentional.

## 2019-01-28 NOTE — Anesthesia Procedure Notes (Signed)
Procedure Name: Intubation Date/Time: 01/28/2019 3:37 PM Performed by: Dionne Bucy, CRNA Pre-anesthesia Checklist: Patient identified, Patient being monitored, Timeout performed, Emergency Drugs available and Suction available Patient Re-evaluated:Patient Re-evaluated prior to induction Oxygen Delivery Method: Circle system utilized Preoxygenation: Pre-oxygenation with 100% oxygen Induction Type: IV induction Ventilation: Mask ventilation without difficulty Laryngoscope Size: Mac and 3 Grade View: Grade I Tube type: Oral Tube size: 7.0 mm Number of attempts: 1 Airway Equipment and Method: Stylet Placement Confirmation: ETT inserted through vocal cords under direct vision,  positive ETCO2 and breath sounds checked- equal and bilateral Secured at: 21 cm Tube secured with: Tape Dental Injury: Teeth and Oropharynx as per pre-operative assessment

## 2019-01-28 NOTE — ED Notes (Signed)
Pt states pain is worsening. Thinks her knee gave out causing her to fall at home, denies LOC, alert and oriented x4. Walks with a cane at home.

## 2019-01-28 NOTE — Transfer of Care (Signed)
Immediate Anesthesia Transfer of Care Note  Patient: Kimberly Hudson  Procedure(s) Performed: INTRAMEDULLARY (IM) NAIL INTERTROCHANTRIC (Right )  Patient Location: PACU  Anesthesia Type:General  Level of Consciousness: sedated  Airway & Oxygen Therapy: Patient Spontanous Breathing and Patient connected to face mask oxygen  Post-op Assessment: Report given to RN and Post -op Vital signs reviewed and stable  Post vital signs: Reviewed and stable  Last Vitals:  Vitals Value Taken Time  BP 102/40 01/28/2019  5:46 PM  Temp    Pulse 84 01/28/2019  5:49 PM  Resp 16 01/28/2019  5:49 PM  SpO2 100 % 01/28/2019  5:49 PM  Vitals shown include unvalidated device data.  Last Pain:  Vitals:   01/28/19 1429  TempSrc: Temporal  PainSc: 10-Worst pain ever      Patients Stated Pain Goal: 0 (97/84/78 4128)  Complications: No apparent anesthesia complications

## 2019-01-28 NOTE — ED Notes (Signed)
Ice pack placed to right hip.

## 2019-01-28 NOTE — ED Notes (Signed)
Pt placed on 2L O2, falling asleep and desatting to 82-88% on RA. Pt states she desats at home during sleep as well. Tolerating 2L [place] O2, sates remaining above 95%.

## 2019-01-28 NOTE — H&P (Signed)
H&P reviewed. No significant changes noted from my prior consult note.

## 2019-01-28 NOTE — Op Note (Signed)
DATE OF SURGERY: 01/28/2019  PREOPERATIVE DIAGNOSIS: Right intertrochanteric (reverse obliquity) hip fracture  POSTOPERATIVE DIAGNOSIS: Right intertrochanteric (reverse obliquity) hip fracture  PROCEDURE: Intramedullary nailing of R femur with cephalomedullary device  SURGEON: Cato Mulligan, MD  ANESTHESIA: spinal  EBL: 100 cc  IVF: per anesthesia record  COMPONENTS:  AOS [place] ES Long Nail: 11x321m; 1030mlag screw; 3274mistal cortical interlocking screw  INDICATIONS: Kimberly Hudson a [age]o. female who sustained an intertrochanteric fracture after a fall. Risks and benefits of intramedullary nailing were explained to the patient. Risks include but are not limited to bleeding, infection, injury to tissues, nerves, vessels, nonunion/malunion, hardware failure, limb length discrepancy/hip rotation mismatch and risks of anesthesia. The patient and family understand these risks, have completed an informed consent, and wish to proceed.   PROCEDURE:  The patient was brought into the operating room. After administering anesthesia, the patient was placed in the supine position on the Hana table. The uninjured leg was placed in an extended position while the injured lower extremity was placed in longitudinal traction. The fracture was reduced using longitudinal traction and internal rotation. The adequacy of reduction was verified fluoroscopically in AP and lateral projections and found to be acceptable. The lateral aspect of the right hip and thigh were prepped with ChloraPrep solution before being draped sterilely. Preoperative IV antibiotics were administered. A timeout was performed to verify the appropriate surgical site, patient, and procedure.    The greater trochanter was identified and an approximately 6 cm incision was made about 3 fingerbreadths above the tip of the greater trochanter. The incision was carried down through the subcutaneous tissues to expose the gluteal fascia. This  was split the length of the incision, providing access to the tip of the trochanter. Under fluoroscopic guidance, a guidewire was drilled through the tip of the trochanter into the proximal metaphysis to the level of the lesser trochanter. After verifying its position fluoroscopically in AP and lateral projections, it was overreamed with the opening reamer to the level of the lesser trochanter. A guidewire was passed down through the femoral canal to the supracondylar region. The adequacy of guidewire position was verified fluoroscopically in AP and lateral projections before the length of the guidewire within the canal was measured and a nail of appropriate length was selected. The guidewire was overreamed sequentially using the flexible reamers. The appropriate sized nail was selected and advanced to the appropriate depth as verified fluoroscopically.    The guide system for the lag screw was positioned and advanced through an approximately 3cm stab incision over the lateral aspect of the proximal femur. The guidewire was drilled up through the femoral nail and into the femoral neck to rest within 5 mm of subchondral bone. After verifying its position in the femoral neck and head in both AP and lateral projections, the guidewire was measured and appropriate sized lag screw was selected. The guidewire was overreamed to the appropriate depth before the lag screw was inserted and advanced to the appropriate depth as verified fluoroscopically in AP and lateral projections. The lag screw was advanced and the locking mechanism was deployed. Again, the adequacy of hardware position and fracture reduction was verified fluoroscopically in AP and lateral projections.   Attention was then turned to the distal interlocking screw in the diaphysis. Using a targeted assembly, a stab incision was made and hole was drilled through the nail. An interlocking screw was placed with excellent purchase. Again the adequacy of screw  position was  verified fluoroscopically in AP and lateral projections.   The wounds were irrigated thoroughly with sterile saline solution. Local anesthetic was injected into the wounds. Deep fascia was closed with 0-Vicryl. The subcutaneous tissues were closed using 2-0 Vicryl interrupted sutures. The skin was closed using staples. Sterile occlusive dressings were applied to all wounds. The patient was then transferred to the recovery room in satisfactory condition.   POSTOPERATIVE PLAN: The patient will be FFWB on the operative extremity x 6 weeks. Lovenox 51m/day x 4 weeks to start on POD#1. Perioperative IV antibiotics x 24 hours. PT/OT on POD#1.

## 2019-01-29 ENCOUNTER — Encounter: Payer: Self-pay | Admitting: Radiology

## 2019-01-29 ENCOUNTER — Inpatient Hospital Stay: Payer: Medicare HMO

## 2019-01-29 DIAGNOSIS — K50012 Crohn's disease of small intestine with intestinal obstruction: Secondary | ICD-10-CM

## 2019-01-29 LAB — CBC
HCT: 23.3 % — ABNORMAL LOW (ref 36.0–46.0)
Hemoglobin: 7.4 g/dL — ABNORMAL LOW (ref 12.0–15.0)
MCH: 38.1 pg — ABNORMAL HIGH (ref 26.0–34.0)
MCHC: 31.8 g/dL (ref 30.0–36.0)
MCV: 120.1 fL — ABNORMAL HIGH (ref 80.0–100.0)
Platelets: 231 10*3/uL (ref 150–400)
RBC: 1.94 MIL/uL — ABNORMAL LOW (ref 3.87–5.11)
RDW: 20 % — ABNORMAL HIGH (ref 11.5–15.5)
WBC: 2.4 10*3/uL — ABNORMAL LOW (ref 4.0–10.5)
nRBC: 0.9 % — ABNORMAL HIGH (ref 0.0–0.2)

## 2019-01-29 LAB — IRON AND TIBC
Iron: 21 ug/dL — ABNORMAL LOW (ref 28–170)
SATURATION RATIOS: 14 % (ref 10.4–31.8)
TIBC: 152 ug/dL — ABNORMAL LOW (ref 250–450)
UIBC: 131 ug/dL

## 2019-01-29 LAB — BASIC METABOLIC PANEL
Anion gap: 11 (ref 5–15)
BUN: 10 mg/dL (ref 8–23)
CO2: 24 mmol/L (ref 22–32)
Calcium: 7.4 mg/dL — ABNORMAL LOW (ref 8.9–10.3)
Chloride: 105 mmol/L (ref 98–111)
Creatinine, Ser: 0.97 mg/dL (ref 0.44–1.00)
GFR calc Af Amer: >60 mL/min (ref >60)
GFR calc non Af Amer: >60 mL/min (ref >60)
Glucose, Bld: 199 mg/dL — ABNORMAL HIGH (ref 70–99)
Potassium: 3.7 mmol/L (ref 3.5–5.1)
Sodium: 140 mmol/L (ref 135–145)

## 2019-01-29 LAB — CALCIUM, IONIZED: CALCIUM, IONIZED, SERUM: 4.5 mg/dL (ref 4.5–5.6)

## 2019-01-29 LAB — FERRITIN: Ferritin: 70 ng/mL (ref 11–307)

## 2019-01-29 MED ORDER — IOHEXOL 300 MG/ML  SOLN
100.0000 mL | Freq: Once | INTRAMUSCULAR | Status: AC | PRN
  Administered 2019-01-29: 100 mL via INTRAVENOUS

## 2019-01-29 MED ORDER — RIFAXIMIN 550 MG PO TABS
550.0000 mg | ORAL_TABLET | Freq: Two times a day (BID) | ORAL | Status: DC
  Administered 2019-01-29 – 2019-02-06 (×17): 550 mg via ORAL
  Filled 2019-01-29 (×17): qty 1

## 2019-01-29 MED ORDER — ADULT MULTIVITAMIN W/MINERALS CH
1.0000 | ORAL_TABLET | Freq: Every day | ORAL | Status: DC
  Administered 2019-01-29 – 2019-02-06 (×8): 1 via ORAL
  Filled 2019-01-29 (×9): qty 1

## 2019-01-29 MED ORDER — BUDESONIDE 3 MG PO CPEP
9.0000 mg | ORAL_CAPSULE | Freq: Every day | ORAL | Status: DC
  Administered 2019-01-29 – 2019-02-06 (×9): 9 mg via ORAL
  Filled 2019-01-29 (×9): qty 3

## 2019-01-29 MED ORDER — CYANOCOBALAMIN 1000 MCG/ML IJ SOLN
1000.0000 ug | Freq: Once | INTRAMUSCULAR | Status: AC
  Administered 2019-01-29: 1000 ug via SUBCUTANEOUS
  Filled 2019-01-29: qty 1

## 2019-01-29 MED ORDER — PRO-STAT SUGAR FREE PO LIQD
30.0000 mL | Freq: Two times a day (BID) | ORAL | Status: DC
  Administered 2019-01-29 – 2019-02-06 (×12): 30 mL via ORAL

## 2019-01-29 NOTE — Discharge Instructions (Signed)
INSTRUCTIONS AFTER Surgery  o Remove items at home which could result in a fall. This includes throw rugs or furniture in walking pathways o ICE to the affected joint every three hours while awake for 30 minutes at a time, for at least the first 3-5 days, and then as needed for pain and swelling.  Continue to use ice for pain and swelling. You may notice swelling that will progress down to the foot and ankle.  This is normal after surgery.  Elevate your leg when you are not up walking on it.   o Continue to use the breathing machine you got in the hospital (incentive spirometer) which will help keep your temperature down.  It is common for your temperature to cycle up and down following surgery, especially at night when you are not up moving around and exerting yourself.  The breathing machine keeps your lungs expanded and your temperature down.   DIET:  As you were doing prior to hospitalization, we recommend a well-balanced diet.  DRESSING / WOUND CARE / SHOWERING  Dressing changes needed.  The present dressing is waterproof.  Able to bathe.  Staples will be removed in 2 weeks at [place]  o Increase activity slowly as tolerated, but follow the weight bearing instructions below.   o No driving for 6 weeks or until further direction given by your physician.  You cannot drive while taking narcotics.  o No lifting or carrying greater than 10 lbs. until further directed by your surgeon. o Avoid periods of inactivity such as sitting longer than an hour when not asleep. This helps prevent blood clots.  o You may return to work once you are authorized by your doctor.     WEIGHT BEARING  Toe-touch weight-bear on the right with a walker.   EXERCISES Gait training and ambulation with a walker.  CONSTIPATION  Constipation is defined medically as fewer than three stools per week and severe constipation as less than one stool per week.  Even if you have a regular bowel pattern  at home, your normal regimen is likely to be disrupted due to multiple reasons following surgery.  Combination of anesthesia, postoperative narcotics, change in appetite and fluid intake all can affect your bowels.   YOU MUST use at least one of the following options; they are listed in order of increasing strength to get the job done.  They are all available over the counter, and you may need to use some, POSSIBLY even all of these options:    Drink plenty of fluids (prune juice may be helpful) and high fiber foods Colace 100 mg by mouth twice a day  Senokot for constipation as directed and as needed Dulcolax (bisacodyl), take with full glass of water  Miralax (polyethylene glycol) once or twice a day as needed.  If you have tried all these things and are unable to have a bowel movement in the first 3-4 days after surgery call either your surgeon or your primary doctor.    If you experience loose stools or diarrhea, hold the medications until you stool forms back up.  If your symptoms do not get better within 1 week or if they get worse, check with your doctor.  If you experience "the worst abdominal pain ever" or develop nausea or vomiting, please contact the office immediately for further recommendations for treatment.   ITCHING:  If you experience itching with your medications, try taking only a single pain pill, or even half  a pain pill at a time.  You can also use Benadryl over the counter for itching or also to help with sleep.   TED HOSE STOCKINGS:  Use stockings on both legs until for at least 2 weeks or as directed by physician office. They may be removed at night for sleeping.  MEDICATIONS:  See your medication summary on the After Visit Summary that nursing will review with you.  You may have some home medications which will be placed on hold until you complete the course of blood thinner medication.  It is important for you to complete the blood thinner medication as  prescribed.  PRECAUTIONS:  If you experience chest pain or shortness of breath - call 911 immediately for transfer to the hospital emergency department.   If you develop a fever greater that 101 F, purulent drainage from wound, increased redness or drainage from wound, foul odor from the wound/dressing, or calf pain - CONTACT YOUR SURGEON.                                                   FOLLOW-UP APPOINTMENTS:  If you do not already have a post-op appointment, please call the office for an appointment to be seen by your surgeon.  Guidelines for how soon to be seen are listed in your After Visit Summary, but are typically between 1-4 weeks after surgery.  OTHER INSTRUCTIONS:     MAKE SURE YOU:   Understand these instructions.   Get help right away if you are not doing well or get worse.    Thank you for letting us be a part of your medical care team.  It is a privilege we respect greatly.  We hope these instructions will help you stay on track for a fast and full recovery!

## 2019-01-29 NOTE — NC FL2 (Signed)
[place] LEVEL OF CARE SCREENING TOOL     IDENTIFICATION  Patient Name: Kimberly Hudson Birthdate: 1956/04/09 Sex: female Admission Date (Current Location): 01/27/2019  [place] and [place] Number:  Engineering geologist and Address:  [facility], [address], [place], [place] [address]      Provider Number: [id number]  Attending Physician Name and Address:  Loletha Grayer, MD  Relative Name and Phone Number:       Current Level of Care: Hospital Recommended Level of Care: [place] Prior Approval Number:    Date Approved/Denied:   PASRR Number: [id number]  Discharge Plan: SNF    Current Diagnoses: Patient Active Problem List   Diagnosis Date Noted  . Closed intertrochanteric fracture of hip, right, initial encounter ([place]) 01/28/2019  . Anxiety 04/29/2018  . Abnormal lung function test 04/29/2018  . Chondromalacia patellae 05/08/2017  . Low back strain 01/22/2017  . Low back pain 01/22/2017  . Problems with swallowing and mastication   . Immunosuppression ([place]) 01/08/2016  . Anemia 08/28/2015  . Depression 08/28/2015  . History of basal cell cancer 08/28/2015  . Hyperlipidemia 08/28/2015  . Hypertension 08/28/2015  . Hypokalemia 08/28/2015  . Localized osteoarthrosis of left shoulder region 08/28/2015  . Osteoarthritis of knee 08/28/2015  . Reflux 08/28/2015  . Restless leg syndrome 08/28/2015  . Insomnia 08/28/2015  . Stricture and stenosis of esophagus   . Candida esophagitis ([place])   . Gastritis   . CD (Crohn's disease) ([place]) 06/19/2015  . 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 Severe cervical dysplasia 08/12/2013    Orientation RESPIRATION BLADDER Height & Weight     Self, Time, Place  Normal Continent Weight: 195 lb (88.5 kg) Height:  5' 7"  (170.2 cm)  BEHAVIORAL SYMPTOMS/MOOD NEUROLOGICAL BOWEL NUTRITION STATUS  (none) (none) Continent Diet(Regular diet )  AMBULATORY  STATUS COMMUNICATION OF NEEDS Skin   Extensive Assist Verbally Surgical wounds(Righ Hip incision )                       Personal Care Assistance Level of Assistance  Bathing, Feeding, Dressing Bathing Assistance: Limited assistance Feeding assistance: Independent Dressing Assistance: Limited assistance     Functional Limitations Info  Sight, Hearing, Speech Sight Info: Adequate Hearing Info: Adequate Speech Info: Adequate    SPECIAL CARE FACTORS FREQUENCY  PT (By licensed PT), OT (By licensed OT)     PT Frequency: 5 OT Frequency: 5            Contractures Contractures Info: Not present    Additional Factors Info  Code Status, Allergies Code Status Info: Full Code  Allergies Info: Nsaids, Sulfa Antibiotics            Current Medications (01/29/2019):  This is the current hospital active medication list Current Facility-Administered Medications  Medication Dose Route Frequency Provider Last Rate Last Dose  . acetaminophen (TYLENOL) tablet 1,000 mg  1,000 mg Oral Q8H Leim Fabry, MD   1,000 mg at 01/29/19 1427  . bisacodyl (DULCOLAX) suppository 10 mg  10 mg Rectal Daily PRN Leim Fabry, MD      . cyanocobalamin ((VITAMIN B-12)) injection 1,000 mcg  1,000 mcg Subcutaneous Once Vanga, Tally Due, MD      . cyclobenzaprine (FLEXERIL) tablet 5 mg  5 mg Oral TID PRN Leim Fabry, MD   5 mg at 01/29/19 0953  . dicyclomine (BENTYL) tablet 20 mg  20 mg Oral TID Jacqlyn Larsen, MD  20 mg at 01/29/19 1427  . diphenoxylate-atropine (LOMOTIL) 2.5-0.025 MG per tablet 1 tablet  1 tablet Oral QID PRN Leim Fabry, MD      . docusate sodium (COLACE) capsule 100 mg  100 mg Oral BID Leim Fabry, MD   100 mg at 01/29/19 0953  . enoxaparin (LOVENOX) injection 40 mg  40 mg Subcutaneous Q24H Leim Fabry, MD   40 mg at 01/29/19 4650  . feeding supplement (PRO-STAT SUGAR FREE 64) liquid 30 mL  30 mL Oral BID Loletha Grayer, MD   30 mL at 01/29/19 1428  . FLUoxetine (PROZAC)  capsule 80 mg  80 mg Oral Daily Leim Fabry, MD   80 mg at 01/29/19 0953  . HYDROmorphone (DILAUDID) injection 0.25-0.5 mg  0.25-0.5 mg Intravenous Q2H PRN Leim Fabry, MD   0.5 mg at 01/29/19 1348  . methocarbamol (ROBAXIN) tablet 500 mg  500 mg Oral Q6H PRN Leim Fabry, MD   500 mg at 01/29/19 1349   Or  . methocarbamol (ROBAXIN) 500 mg in dextrose 5 % 50 mL IVPB  500 mg Intravenous Q6H PRN Leim Fabry, MD      . metoCLOPramide (REGLAN) tablet 5-10 mg  5-10 mg Oral Q8H PRN Leim Fabry, MD       Or  . metoCLOPramide (REGLAN) injection 5-10 mg  5-10 mg Intravenous Q8H PRN Leim Fabry, MD      . mirtazapine (REMERON) tablet 7.5 mg  7.5 mg Oral QHS Leim Fabry, MD      . mometasone-formoterol [facility]) 100-5 MCG/ACT inhaler 2 puff  2 puff Inhalation BID Leim Fabry, MD   2 puff at 01/29/19 [id number]  . multivitamin with minerals tablet 1 tablet  1 tablet Oral Daily Loletha Grayer, MD   1 tablet at 01/29/19 1502  . ondansetron (ZOFRAN) tablet 4 mg  4 mg Oral Q6H PRN Leim Fabry, MD       Or  . ondansetron [facility]) injection 4 mg  4 mg Intravenous Q6H PRN Leim Fabry, MD   4 mg at 01/29/19 0149  . oxyCODONE (Oxy IR/ROXICODONE) immediate release tablet 2.5-5 mg  2.5-5 mg Oral Q3H PRN Leim Fabry, MD      . oxyCODONE (Oxy IR/ROXICODONE) immediate release tablet 5-10 mg  5-10 mg Oral Q4H PRN Leim Fabry, MD      . pantoprazole (PROTONIX) EC tablet 40 mg  40 mg Oral BID Leim Fabry, MD   40 mg at 01/29/19 0953  . pravastatin (PRAVACHOL) tablet 10 mg  10 mg Oral Daily Leim Fabry, MD   10 mg at 01/28/19 2019  . rOPINIRole (REQUIP) tablet 0.5 mg  0.5 mg Oral QHS Leim Fabry, MD   0.5 mg at 01/28/19 2021  . senna-docusate (Senokot-S) tablet 1 tablet  1 tablet Oral QHS PRN Leim Fabry, MD      . sodium phosphate (FLEET) 7-19 GM/118ML enema 1 enema  1 enema Rectal Once PRN Leim Fabry, MD      . traMADol Veatrice Bourbon) tablet 50 mg  50 mg Oral Q6H PRN Leim Fabry, MD         Discharge  Medications: Please see discharge summary for a list of discharge medications.  Relevant Imaging Results:  Relevant Lab Results:   Additional Information SSN: [id number]  Annamaria Boots, [place]

## 2019-01-29 NOTE — Progress Notes (Signed)
Initial Nutrition Assessment  DOCUMENTATION CODES:   Obesity unspecified  INTERVENTION:   - MVI with minerals daily  - Pro-stat 30 ml BID, each supplement provides 100 kcal and 15 grams of protein  NUTRITION DIAGNOSIS:   Increased nutrient needs related to post-op healing as evidenced by estimated needs.  GOAL:   Patient will meet greater than or equal to 90% of their needs  MONITOR:   Supplement acceptance, PO intake, Labs, Skin  REASON FOR ASSESSMENT:   Consult Hip fracture protocol  ASSESSMENT:   63 year old female who presented to the ED on 3/11 after a fall. PMH of Crohn's disease on immunotherapy, chronic diarrhea, HTN. Pt found to have R intertrochanteric hip fracture.  3/12 - s/p intramedullary nailing of R femur with cephalomedullary device  Spoke with pt at bedside. Pt tearful throughout visit, reporting how her daughter's boyfriend passed away 2 weeks ago and how hard this has been on the family.  Pt's lunch tray was brought to her during RD visit. Pt reports feeling very hungry because she wasn't able to eat her whole breakfast due to being interrupted. RD assisted pt in setting up her meal tray.  Pt reports that she has been dealing with Crohn's for a long time and is used to having diarrhea after every meal. Pt denies taking a MVI but reports she plans to after she is d/c. RD to order. Pt states that she avoids green leafy vegetables, popcorn, canned corn, and peanuts due to intolerance with Crohn's.  Pt shares that she has an esophageal stricture which may often make it difficult for her to swallow. Pt states that she has had the stricture dilated several times and that it is not currently at its worst. Pt states that because of this, she does not eat bread unless it is toasted and does not eat "chunks" of meat but will have ground or shredded meat.  Pt reports that she typically eats 2 meals daily. A meal might include [employer] chicken noodle soup with  crackers OR spaghetti OR a sandwich OR chicken tenders. Pt states that when she feels well, she cooks her herself and her grandson. She also reports that her daughter will occasionally cook for her.  Pt denies any recent weight changes and reports that she has maintained her weight at 195 lbs for "several years." Weight history in chart confirms this. However, it appears that pt's weight on admission was stated rather than measured so unsure of accuracy. Pt reports that at one point, she lost weight down to 187 lbs but quickly gained it back.  Pt declines any oral nutrition supplements like Boost or Ensure as she reports that they make her diarrhea worse. RD to order Pro-stat to aid in meeting protein needs and to aid in wound healing post-op. Also noted therapies recommending SNF.  Medications reviewed and include: vitamin B-12 1,000 mcg once, Colace, Remeron 7.5 mg daily, Imuran, Protonix, IV antibiotics  Labs reviewed: phosphorus 2.2 (L), magnesium 1.6 (L), hemoglobin 7.4 (L)  UOP: 1250 ml x 24 hours  NUTRITION - FOCUSED PHYSICAL EXAM:    Most Recent Value  Orbital Region  Mild depletion  Upper Arm Region  No depletion  Thoracic and Lumbar Region  No depletion  Buccal Region  No depletion  Temple Region  No depletion  Clavicle Bone Region  Mild depletion  Clavicle and Acromion Bone Region  Mild depletion  Scapular Bone Region  Unable to assess  Dorsal Hand  No depletion  Patellar Region  No depletion  Anterior Thigh Region  No depletion  Posterior Calf Region  No depletion  Edema (RD Assessment)  Mild [BLE]  Hair  Reviewed  Eyes  Reviewed  Mouth  Reviewed  Skin  Reviewed  Nails  Reviewed       Diet Order:   Diet Order            Diet regular Room service appropriate? Yes; Fluid consistency: Thin  Diet effective now              EDUCATION NEEDS:   Education needs have been addressed  Skin:  Skin Assessment: Skin Integrity Issues: Incisions: closed surgical  incision to R hip  Last BM:  01/27/19  Height:   Ht Readings from Last 1 Encounters:  01/27/19 5' 7"  (1.702 m)    Weight:   Wt Readings from Last 1 Encounters:  01/27/19 88.5 kg    Ideal Body Weight:  61.4 kg  BMI:  Body mass index is 30.54 kg/m.  Estimated Nutritional Needs:   Kcal:  1700-1900  Protein:  90-105 grams  Fluid:  1.7-1.9 L    Gaynell Face, MS, RD, LDN Inpatient Clinical Dietitian Pager: [phone] Weekend/After Hours: [phone]

## 2019-01-29 NOTE — TOC Initial Note (Signed)
Transition of Care [facility]) - Initial/Assessment Note    Patient Details  Name: LEMOYNE SCARPATI MRN: [id number] Date of Birth: 1956-06-30  Transition of Care [facility]) CM/SW Contact:    Su Hilt, RN Phone Number: 01/29/2019, 4:25 PM  Clinical Narrative:                   Expected Discharge Plan: Skilled Nursing Facility Barriers to Discharge: SNF Pending discharge orders, SNF Pending discharge summary, SNF Pending bed offer, Insurance Authorization   Patient Goals and CMS Choice Patient states their goals for this hospitalization and ongoing recovery are:: go to rehab CMS Medicare.gov Compare Post Acute Care list provided to:: Patient Choice offered to / list presented to : Patient, Adult Children  Expected Discharge Plan and Services Expected Discharge Plan: [place] Discharge Planning Services: CM Consult Post Acute Care Choice: [place] arrangements for the past 2 months: Group Home, Mobile Home(has 6 steps into home)                          Prior Living Arrangements/Services Living arrangements for the past 2 months: Group Home, Mobile Home(has 6 steps into home) Lives with:: Adult Children Patient language and need for interpreter reviewed:: Yes Do you feel safe going back to the place where you live?: Yes      Need for Family Participation in Patient Care: No (Comment) Care giver support system in place?: Yes (comment) Current home services: DME Criminal Activity/Legal Involvement Pertinent to Current Situation/Hospitalization: No - Comment as needed  Activities of Daily Living Home Assistive Devices/Equipment: Cane (specify quad or straight) ADL Screening (condition at time of admission) Patient's cognitive ability adequate to safely complete daily activities?: Yes Is the patient deaf or have difficulty hearing?: No Does the patient have difficulty seeing, even when wearing glasses/contacts?: No Does the patient have difficulty  concentrating, remembering, or making decisions?: No Patient able to express need for assistance with ADLs?: Yes Does the patient have difficulty dressing or bathing?: No Independently performs ADLs?: Yes (appropriate for developmental age) Does the patient have difficulty walking or climbing stairs?: Yes Weakness of Legs: Right Weakness of Arms/Hands: None  Permission Sought/Granted         Permission granted to share info w AGENCY: SNF        Emotional Assessment Appearance:: Appears stated age Attitude/Demeanor/Rapport: Engaged Affect (typically observed): Accepting Orientation: : Oriented to Self, Oriented to Place, Oriented to  Time, Oriented to Situation Alcohol / Substance Use: Never Used Psych Involvement: No (comment)  Admission diagnosis:  Hypokalemia [E87.6] Closed nondisplaced intertrochanteric fracture of left femur, initial encounter ([place]) [S72.145A] Closed intertrochanteric fracture of hip, right, initial encounter [facility]) [S72.141A] Patient Active Problem List   Diagnosis Date Noted  . Closed intertrochanteric fracture of hip, right, initial encounter ([place]) 01/28/2019  . Anxiety 04/29/2018  . Abnormal lung function test 04/29/2018  . Chondromalacia patellae 05/08/2017  . Low back strain 01/22/2017  . Low back pain 01/22/2017  . Problems with swallowing and mastication   . Immunosuppression ([place]) 01/08/2016  . Anemia 08/28/2015  . Depression 08/28/2015  . History of basal cell cancer 08/28/2015  . Hyperlipidemia 08/28/2015  . Hypertension 08/28/2015  . Hypokalemia 08/28/2015  . Localized osteoarthrosis of left shoulder region 08/28/2015  . Osteoarthritis of knee 08/28/2015  . Reflux 08/28/2015  . Restless leg syndrome 08/28/2015  . Insomnia 08/28/2015  . Stricture and stenosis of esophagus   . Candida esophagitis (  [place])   . Gastritis   . CD (Crohn's disease) ([place]) 06/19/2015  . 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 Severe cervical  dysplasia 08/12/2013   PCP:  Birdie Sons, MD Pharmacy:   [id number] [place]Phone: [phone]: [phone]    Social Determinants of Health (SDOH) Interventions    Readmission Risk Interventions 30 Day Unplanned Readmission Risk Score     ED to Hosp-Admission (Current) from [place]/2019 [place](1A)  30 Day Unplanned Readmission Risk Score (%)  21 Filed at 01/29/2019 1600     This score is the patient's risk of an unplanned readmission within 30 days of being discharged (0 -100%). The score is based on dignosis, age, lab data, medications, orders, and past utilization.   Low:  0-14.9   Medium: 15-21.9   High: 22-29.9   Extreme: 30 and above       No flowsheet data found.

## 2019-01-29 NOTE — Progress Notes (Signed)
Patient ID: Kimberly Hudson, female   DOB: 12/11/1955, 63 y.o.   MRN: [id number]  [employer] PROGRESS NOTE  Kimberly Hudson DOB: 03-23-1956 DOA: 01/27/2019 PCP: Birdie Sons, MD  HPI/Subjective: Patient with a lot less pain today.  The fracture pain was definitely worse than the postoperative pain.  No bowel movement since the surgery.  Does have chronic diarrhea.  Objective: Vitals:   01/29/19 0522 01/29/19 0732  BP: (!) 112/57 (!) 102/57  Pulse: 83 80  Resp: 18 16  Temp: 98.5 F (36.9 C) 99.1 F (37.3 C)  SpO2: 97% 93%    Filed Weights   01/27/19 2240  Weight: 88.5 kg    ROS: Review of Systems  Constitutional: Negative for chills and fever.  Eyes: Negative for blurred vision.  Respiratory: Negative for cough and shortness of breath.   Cardiovascular: Negative for chest pain.  Gastrointestinal: Negative for abdominal pain, constipation, diarrhea, nausea and vomiting.  Genitourinary: Negative for dysuria.  Musculoskeletal: Positive for joint pain.  Neurological: Negative for dizziness and headaches.   Exam: Physical Exam  Constitutional: She is oriented to person, place, and time.  HENT:  Nose: No mucosal edema.  Mouth/Throat: No oropharyngeal exudate or posterior oropharyngeal edema.  Eyes: Pupils are equal, round, and reactive to light. Conjunctivae, EOM and lids are normal.  Neck: No JVD present. Carotid bruit is not present. No edema present. No thyroid mass and no thyromegaly present.  Cardiovascular: S1 normal and S2 normal. Exam reveals no gallop.  No murmur heard. Pulses:      Dorsalis pedis pulses are 2+ on the right side and 2+ on the left side.  Respiratory: No respiratory distress. She has decreased breath sounds in the right lower field and the left lower field. She has no wheezes. She has no rhonchi. She has no rales.  GI: Soft. Bowel sounds are normal. There is no abdominal tenderness.  Musculoskeletal:     Right ankle: She  exhibits no swelling.     Left ankle: She exhibits no swelling.  Lymphadenopathy:    She has no cervical adenopathy.  Neurological: She is alert and oriented to person, place, and time. No cranial nerve deficit.  Skin: Skin is warm. No rash noted. Nails show no clubbing.  Psychiatric: She has a normal mood and affect.      Data Reviewed: Basic Metabolic Panel: Recent Labs  Lab 01/27/19 2247 01/28/19 0031 01/28/19 0847 01/29/19 0253  NA 137  --  137 140  K <2.0*  --  3.2* 3.7  CL 96*  --  99 105  CO2 27  --  29 24  GLUCOSE 122*  --  117* 199*  BUN 9  --  10 10  CREATININE 0.92  --  0.97 0.97  CALCIUM 7.6*  --  7.9* 7.4*  MG  --  1.6*  --   --   PHOS  --  2.2*  --   --    Liver Function Tests: Recent Labs  Lab 01/27/19 2247  AST 48*  ALT 18  ALKPHOS 154*  BILITOT 1.0  PROT 5.8*  ALBUMIN 2.3*    CBC: Recent Labs  Lab 01/27/19 2247 01/29/19 0253  WBC 3.4* 2.4*  NEUTROABS 2.5  --   HGB 9.2* 7.4*  HCT 26.9* 23.3*  MCV 113.0* 120.1*  PLT 290 231   \  Studies: Dg Chest 1 View  Result Date: 01/27/2019 CLINICAL DATA:  Right hip pain pain after fall. EXAM: CHEST  1 VIEW COMPARISON:  Report from 12/05/2008 FINDINGS: The lung volumes are low. The cardiopericardial silhouette is mildly enlarged in part due to the AP projection. No acute pulmonary consolidation, effusion or pneumothorax. Retrocardiac opacities may reflect atelectasis. No acute displaced fracture. No abnormal fluid collections. IMPRESSION: No active disease. Mildly enlarged cardiopericardial silhouette in part due to the AP projection. Electronically Signed   By: Ashley Royalty M.D.   On: 01/27/2019 23:46   Dg Pelvis 1-2 Views  Result Date: 01/27/2019 CLINICAL DATA:  Right hip pain and deformity. EXAM: PELVIS - 1-2 VIEW COMPARISON:  None. FINDINGS: There is an acute, comminuted, varus angulated closed fracture of the proximal left femur involving the basicervical portion of the femur. This fracture  undermines the greater trochanter in exits in the subtrochanteric portion of the femur. Avulsed lesser trochanter is also suggested. No joint dislocation. No pelvic diastasis. Lower lumbar degenerative disc and endplate changes are noted from L3 through S1. The native left hip is intact with degenerative joint space narrowing. IMPRESSION: Acute, varus angulated, basicervical fracture of the right femur extending through the intertrochanteric portion of the femur and undermining the greater trochanter. Avulsed lesser trochanter is also seen. Electronically Signed   By: Ashley Royalty M.D.   On: 01/27/2019 23:52   Dg Hip Operative Unilat W Or W/o Pelvis Right  Result Date: 01/28/2019 CLINICAL DATA:  Intraoperative imaging for ORIF of a proximal right femur fracture. EXAM: OPERATIVE RIGHT HIP (WITH PELVIS IF PERFORMED) 9 VIEWS TECHNIQUE: Fluoroscopic spot image(s) were submitted for interpretation post-operatively. COMPARISON:  01/27/2019 FINDINGS: Images show placement of a long intramedullary rod supporting a compression screw, reducing the primary fracture components of the displaced, comminuted intertrochanteric fracture into near anatomic alignment. The orthopedic hardware appears well seated. IMPRESSION: Well aligned proximal right femur fracture following ORIF. Electronically Signed   By: Lajean Manes M.D.   On: 01/28/2019 17:30   Dg Femur Min 2 Views Right  Result Date: 01/27/2019 CLINICAL DATA:  Right hip pain and deformity EXAM: RIGHT FEMUR 2 VIEWS COMPARISON:  None. FINDINGS: There is an acute, comminuted, varus angulated closed fracture of the proximal left femur involving the basicervical portion of the femur. This fracture undermines the greater trochanter in exits in the subtrochanteric portion of the femur. Avulsed lesser trochanter is also suggested. No joint dislocation. The included pubic rami appear intact. No pelvic diastasis is seen. Osteopenic appearance of the included knee with  tricompartmental osteoarthritis and spurring noted. No joint effusion is identified though the study is somewhat limited due to obliquity. If the patient is symptomatic over the knee, dedicated knee radiographs are recommended for better assessment. IMPRESSION: Acute, varus angulated, basicervical fracture of the right femur extending through the intertrochanteric portion of the femur and undermining the greater trochanter. Avulsed lesser trochanter is also seen. Electronically Signed   By: Ashley Royalty M.D.   On: 01/27/2019 23:49    Scheduled Meds: . acetaminophen  1,000 mg Oral Q8H  . azaTHIOprine  150 mg Oral Daily  . cyanocobalamin  1,000 mcg Intramuscular Once  . dicyclomine  20 mg Oral TID AC  . docusate sodium  100 mg Oral BID  . enoxaparin (LOVENOX) injection  40 mg Subcutaneous Q24H  . feeding supplement (PRO-STAT SUGAR FREE 64)  30 mL Oral BID  . FLUoxetine  80 mg Oral Daily  . mirtazapine  7.5 mg Oral QHS  . mometasone-formoterol  2 puff Inhalation BID  . multivitamin with minerals  1 tablet Oral Daily  .  pantoprazole  40 mg Oral BID  . pravastatin  10 mg Oral Daily  . rOPINIRole  0.5 mg Oral QHS   Continuous Infusions: . methocarbamol (ROBAXIN) IV      Assessment/Plan:  1. Right intertrochanteric hip fracture requiring operative repair.  Potentially out to rehab tomorrow. 2. Postoperative anemia and chronic anemia.  DC IV fluids.  Check a ferritin.  Type and cross for tomorrow just in case hemoglobin lower. 3. Crohn's disease with chronic diarrhea.  GI to see. 4. Hypokalemia hypophosphatemia and hypomagnesemia.  Electrolytes replaced. 5. Leukopenia. 6. Hyperlipidemia on pravastatin  Code Status:     Code Status Orders  (From admission, onward)         Start     Ordered   01/28/19 0452  Full code  Continuous     01/28/19 0451        Code Status History    This patient has a current code status but no historical code status.     Disposition Plan: Potentially  out to rehab tomorrow depending on hemoglobin  Consultants:  Orthopedic surgery   Procedures:  ORIF right femur  Time spent: 28 minutes  [place]

## 2019-01-29 NOTE — Progress Notes (Signed)
Physical Therapy Treatment Patient Details Name: Kimberly Hudson MRN: [id number] DOB: [birth year] Today's Date: 01/29/2019    History of Present Illness Pt is a 63 yo female with a PMH that includes Crohn's disease, anxiety, CAD, depression, GERD, and HTN presented to the ED after a fall resulting in a R intertrochanteric hip fracture.  Pt is s/p intramedullary nailing of R femur with cephalomedullary device.  MD assessment also includes: weakness, chronic diarrhea, hypochloremia, hyperglycemia, hypocalcemia, transaminasemia, hypoproteinemia/hypoalbuminemia, leukopenia (w/o neutropenia), and macrocytic anemia.     PT Comments    Pt presents with deficits in strength, transfers, mobility, gait, balance, and activity tolerance.  Pt required +2 max A with bed mobility tasks.  Pt required Mod A with transfers with cues for proper sequencing to ensure RLE WB compliance.  Pt was able to maintain standing position for 4-5 min this session but was only able to amb around 2 feet including small forward/backward steps and left side-stepping.  Pt's steps were very small and cautious with cues given for proper sequencing to ensure RLE WB compliance.  Pt will benefit from PT services in a SNF setting upon discharge to safely address above deficits for decreased caregiver assistance and eventual return to PLOF.     Follow Up Recommendations  SNF     Equipment Recommendations  Other (comment)(TBD at next venue of care)    Recommendations for Other Services       Precautions / Restrictions Precautions Precautions: Fall Restrictions Weight Bearing Restrictions: Yes RLE Weight Bearing: Touchdown weight bearing Other Position/Activity Restrictions: Pt is TDWB with the foot to be placed flat on the floor with ambulation (FFWB) with the weight of the leg only    Mobility  Bed Mobility Overal bed mobility: Needs Assistance Bed Mobility: Supine to Sit;Sit to Supine     Supine to sit: +2 for physical  assistance;Max assist Sit to supine: +2 for physical assistance;Max assist   General bed mobility comments: All bed mobility tasks limited by RLE pain with movement  Transfers Overall transfer level: Needs assistance Equipment used: Rolling walker (2 wheeled) Transfers: Sit to/from Stand Sit to Stand: Mod assist         General transfer comment: Mod verbal cues for sequencing for RLE WB compliance  Ambulation/Gait Ambulation/Gait assistance: Min assist Gait Distance (Feet): 2 Feet Assistive device: Rolling walker (2 wheeled) Gait Pattern/deviations: Step-to pattern;Antalgic Gait velocity: decreased   General Gait Details: Forward/backward steps and side-stepping at EOB with mod verbal cues for sequencing for RLE WB compliance with min A provided for stability;    Stairs             Wheelchair Mobility    Modified Rankin (Stroke Patients Only)       Balance Overall balance assessment: Needs assistance   Sitting balance-Leahy Scale: Good     Standing balance support: Bilateral upper extremity supported Standing balance-Leahy Scale: Fair                              Cognition Arousal/Alertness: Awake/alert Behavior During Therapy: WFL for tasks assessed/performed Overall Cognitive Status: Within Functional Limits for tasks assessed                                        Exercises Total Joint Exercises Ankle Circles/Pumps: AROM;Both;5 reps;10 reps Quad Sets: Strengthening;Both;5 reps;10 reps Gluteal  Sets: Strengthening;Both;5 reps;10 reps Short Arc Quad: AROM;AAROM;Right;5 reps;10 reps Heel Slides: AAROM;Right;5 reps(small amplitude) Hip ABduction/ADduction: AROM;Left;10 reps Straight Leg Raises: AROM;Left;10 reps Long Arc Quad: AROM;Left;10 reps Knee Flexion: AROM;Left;10 reps Other Exercises Other Exercises: HEP education for BLE APs, QS, and GS x 10 each every 1-2 hours daily    General Comments        Pertinent  Vitals/Pain Pain Assessment: 0-10 Pain Score: 5  Pain Location: R hip Pain Descriptors / Indicators: Aching;Sore Pain Intervention(s): Premedicated before session;Limited activity within patient's tolerance;Monitored during session    Home Living Family/patient expects to be discharged to:: Private residence Living Arrangements: Children;Other relatives(daughter and grandson) Available Help at Discharge: Family;Available 24 hours/day Type of Home: Mobile home Home Access: Stairs to enter Entrance Stairs-Rails: Right;Left;Can reach both Home Layout: One level Home Equipment: Clinical cytogeneticist - 2 wheels;Cane - single point;Cane - quad;Walker - 4 wheels;Wheelchair - manual Additional Comments: Has access to a w/c from a friend    Prior Function Level of Independence: Independent with assistive device(s)      Comments: Mod Ind amb community distances with a SPC, 2 falls in the last 6 months, Ind with ADLs   PT Goals (current goals can now be found in the care plan section) Acute Rehab PT Goals Patient Stated Goal: To walk without falling down PT Goal Formulation: With patient Time For Goal Achievement: 02/11/19 Potential to Achieve Goals: Good Progress towards PT goals: Progressing toward goals    Frequency    BID      PT Plan Current plan remains appropriate    Co-evaluation              AM-PAC PT "6 Clicks" Mobility   Outcome Measure  Help needed turning from your back to your side while in a flat bed without using bedrails?: A Lot Help needed moving from lying on your back to sitting on the side of a flat bed without using bedrails?: Total Help needed moving to and from a bed to a chair (including a wheelchair)?: A Lot Help needed standing up from a chair using your arms (e.g., wheelchair or bedside chair)?: A Lot Help needed to walk in hospital room?: Total Help needed climbing 3-5 steps with a railing? : Total 6 Click Score: 9    End of Session Equipment  Utilized During Treatment: Gait belt Activity Tolerance: Patient limited by pain Patient left: in bed;with call bell/phone within reach;with bed alarm set;with SCD's reapplied Nurse Communication: Mobility status PT Visit Diagnosis: Unsteadiness on feet (R26.81);History of falling (Z91.81);Other abnormalities of gait and mobility (R26.89);Muscle weakness (generalized) (M62.81)     Time: 9935-7017 PT Time Calculation (min) (ACUTE ONLY): 27 min  Charges:  $Gait Training: 8-22 mins $Therapeutic Exercise: 8-22 mins                     D. Scott Kary Colaizzi PT, DPT 01/29/19, 3:40 PM

## 2019-01-29 NOTE — Evaluation (Signed)
Occupational Therapy Evaluation Patient Details Name: Kimberly Hudson MRN: [id number] DOB: [birth year] Today's Date: 01/29/2019    History of Present Illness Pt is a 63 yo female with a PMH that includes Crohn's disease, anxiety, CAD, depression, GERD, and HTN presented to the ED after a fall resulting in a R intertrochanteric hip fracture.  Pt is s/p intramedullary nailing of R femur with cephalomedullary device.  MD assessment also includes: weakness, chronic diarrhea, hypochloremia, hyperglycemia, hypocalcemia, transaminasemia, hypoproteinemia/hypoalbuminemia, leukopenia (w/o neutropenia), and macrocytic anemia.    Clinical Impression   Pt seen for OT evaluation this date, POD#1 from above surgery. Pt was independent in all ADL prior to surgery, however using SPC for mobility and reports 2 falls in past 6 months. Pt is eager to return to PLOF with less pain and improved safety and independence. Pt currently requires MAX assist for LB dressing and bathing while in seated position due to pain and limited AROM of R hip. Pt declined mobility and attempts at ADL tasks 2/2 pain/fatigue after recent PT session. Pt/daughter instructed in self care skills, falls prevention strategies, home/routines modifications, and DME/AE for LB bathing and dressing tasks. Pt would benefit from additional instruction in self care skills and techniques to help maintain precautions with or without assistive devices to support recall and carryover prior to discharge. Recommend STR upon discharge.     Follow Up Recommendations  SNF    Equipment Recommendations  (TBD)    Recommendations for Other Services       Precautions / Restrictions Precautions Precautions: Fall Restrictions Weight Bearing Restrictions: Yes RLE Weight Bearing: Touchdown weight bearing Other Position/Activity Restrictions: Pt is TDWB with the foot to be placed flat on the floor with ambulation (FFWB) with the weight of the leg only       Mobility Bed Mobility   General bed mobility comments: deferred, pt declined 2/2 pain/fatigue after recent PT session  Transfers         General transfer comment: deferred, pt declined 2/2 pain/fatigue after recent PT session    Balance                             ADL either performed or assessed with clinical judgement   ADL Overall ADL's : Needs assistance/impaired                                       General ADL Comments: set up/CGA for seated UB ADL, MAX A for LB ADL     Vision Baseline Vision/History: Wears glasses Wears Glasses: At all times Patient Visual Report: No change from baseline       Perception     Praxis      Pertinent Vitals/Pain Pain Assessment: 0-10 Pain Score: 4  Pain Location: R hip Pain Descriptors / Indicators: Aching;Sore Pain Intervention(s): Limited activity within patient's tolerance;Monitored during session     Hand Dominance Right   Extremity/Trunk Assessment Upper Extremity Assessment Upper Extremity Assessment: Overall WFL for tasks assessed   Lower Extremity Assessment RLE Deficits / Details: All RLE movement very limited by pain RLE: Unable to fully assess due to pain       Communication Communication Communication: No difficulties   Cognition Arousal/Alertness: Awake/alert Behavior During Therapy: WFL for tasks assessed/performed Overall Cognitive Status: Within Functional Limits for tasks assessed  General Comments       Exercises  Other Exercises Other Exercises: HEP education for BLE APs, QS, and GS x 10 each every 1-2 hours daily Other Exercises: Pt/daughter instructed in falls prevention strategies, AE/DME to improve indep/safety with LB ADL tasks   Shoulder Instructions      Home Living Family/patient expects to be discharged to:: Private residence Living Arrangements: Children;Other relatives(daughter and  grandson) Available Help at Discharge: Family;Available 24 hours/day Type of Home: Mobile home Home Access: Stairs to enter Entrance Stairs-Number of Steps: 6 Entrance Stairs-Rails: Right;Left;Can reach both Home Layout: One level     Bathroom Shower/Tub: Tub/shower unit         Home Equipment: Clinical cytogeneticist - 2 wheels;Cane - single point;Cane - quad;Walker - 4 wheels;Wheelchair - manual   Additional Comments: Has access to a w/c from a friend      Prior Functioning/Environment Level of Independence: Independent with assistive device(s)        Comments: Mod Ind amb community distances with a SPC, 2 falls in the last 6 months, Ind with ADLs        OT Problem List: Decreased strength;Decreased knowledge of use of DME or AE;Decreased range of motion;Obesity;Decreased knowledge of precautions;Decreased activity tolerance;Pain;Impaired balance (sitting and/or standing)      OT Treatment/Interventions: Self-care/ADL training;Balance training;Therapeutic exercise;Therapeutic activities;DME and/or AE instruction;Patient/family education    OT Goals(Current goals can be found in the care plan section) Acute Rehab OT Goals Patient Stated Goal: To walk without falling down OT Goal Formulation: With patient/family Time For Goal Achievement: 02/12/19 Potential to Achieve Goals: Good ADL Goals Pt Will Perform Lower Body Dressing: with adaptive equipment;with min assist;sit to/from stand(Min A x2 for transition from sit<> stand, maintaining WBing precautions) Pt Will Transfer to Toilet: with min assist;with +2 assist;bedside commode(LRAD for amb, maintaining WBing precautions)  OT Frequency: Min 2X/week   Barriers to D/C:            Co-evaluation              AM-PAC OT "6 Clicks" Daily Activity     Outcome Measure Help from another person eating meals?: None Help from another person taking care of personal grooming?: None Help from another person toileting, which  includes using toliet, bedpan, or urinal?: A Lot Help from another person bathing (including washing, rinsing, drying)?: A Lot Help from another person to put on and taking off regular upper body clothing?: A Little Help from another person to put on and taking off regular lower body clothing?: A Lot 6 Click Score: 17   End of Session    Activity Tolerance: Patient limited by pain;Patient limited by fatigue Patient left: in bed;with call bell/phone within reach;with bed alarm set;with family/visitor present;with SCD's reapplied  OT Visit Diagnosis: Other abnormalities of gait and mobility (R26.89);Repeated falls (R29.6);Muscle weakness (generalized) (M62.81);Pain Pain - Right/Left: Right Pain - part of body: Hip                Time: 1531-1555 OT Time Calculation (min): 24 min Charges:  OT General Charges $OT Visit: 1 Visit OT Evaluation $OT Eval Low Complexity: 1 Low OT Treatments $Self Care/Home Management : 8-22 mins  Jeni Salles, MPH, MS, OTR/L ascom [phone] 01/29/19, 4:09 PM

## 2019-01-29 NOTE — Progress Notes (Signed)
  Subjective: 1 Day Post-Op Procedure(s) (LRB): INTRAMEDULLARY (IM) NAIL INTERTROCHANTRIC (Right) Patient reports pain as moderate.   Patient seen in rounds with Dr. Posey Pronto. Patient is well, and has had no acute complaints or problems Plan is to go Rehab after hospital stay. Negative for chest pain and shortness of breath Fever: no Gastrointestinal: Negative for nausea and vomiting  Objective: Vital signs in last 24 hours: Temp:  [97 F (36.1 C)-98.9 F (37.2 C)] 98.5 F (36.9 C) (03/13 0522) Pulse Rate:  [74-88] 83 (03/13 0522) Resp:  [11-24] 18 (03/13 0522) BP: (94-130)/(42-70) 112/57 (03/13 0522) SpO2:  [94 %-100 %] 97 % (03/13 0522)  Intake/Output from previous day:  Intake/Output Summary (Last 24 hours) at 01/29/2019 0628 Last data filed at 01/29/2019 0300 Gross per 24 hour  Intake 2981.42 ml  Output 1000 ml  Net 1981.42 ml    Intake/Output this shift: Total I/O In: 681.4 [I.V.:437.4; IV Piggyback:244.1] Out: 300 [Urine:300]  Labs: Recent Labs    01/27/19 2247 01/29/19 0253  HGB 9.2* 7.4*   Recent Labs    01/27/19 2247 01/29/19 0253  WBC 3.4* 2.4*  RBC 2.38* 1.94*  HCT 26.9* 23.3*  PLT 290 231   Recent Labs    01/28/19 0847 01/29/19 0253  NA 137 140  K 3.2* 3.7  CL 99 105  CO2 29 24  BUN 10 10  CREATININE 0.97 0.97  GLUCOSE 117* 199*  CALCIUM 7.9* 7.4*   Recent Labs    01/27/19 2247  INR 1.0     EXAM General - Patient is Alert and Oriented Extremity - Neurovascular intact Sensation intact distally Compartment soft Dressing/Incision - clean, dry, no drainage Motor Function - intact, moving foot and toes well on exam.   Past Medical History:  Diagnosis Date  . Anemia    past hx  . Anxiety   . Arthritis    osteo - shoulders, hips, knees  . Complication of anesthesia    propofol causes headaches  . Coronary artery disease   . Crohn's disease ([place])   . Depression   . GERD (gastroesophageal reflux disease)   . History of chicken  pox   . History of measles   . History of mumps   . Hypertension   . 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 Wears dentures    full upper    Assessment/Plan: 1 Day Post-Op Procedure(s) (LRB): INTRAMEDULLARY (IM) NAIL INTERTROCHANTRIC (Right) Active Problems:   Closed intertrochanteric fracture of hip, right, initial encounter ([place])  Estimated body mass index is 30.54 kg/m as calculated from the following:   Height as of this encounter: 5' 7"  (1.702 m).   Weight as of this encounter: 88.5 kg. Advance diet Up with therapy D/C IV fluids Discharge to SNF when cleared by medicine  DVT Prophylaxis - Lovenox, Foot Pumps and TED hose Toe-touch weight-Bearing to right leg  Reche Dixon, PA-C Orthopaedic Surgery 01/29/2019, 6:28 AM

## 2019-01-29 NOTE — Consult Note (Addendum)
Kimberly Darby, MD [address]  [place]  [place], [place] [address]  Main: [phone]  Fax: [phone] Pager: [phone]   Consultation  Referring Provider:     No ref. provider found Primary Care Physician:  Birdie Sons, MD Primary Gastroenterologist:  Dr. Lucilla Lame         Reason for Consultation:     Crohn's disease, chronic diarrhea  Date of Admission:  01/27/2019 Date of Consultation:  01/29/2019         HPI:   Kimberly Hudson is a 63 y.o. female with history of obesity, Crohn's disease of the terminal ileum admitted to [facility] yesterday after a fall that has resulted in right intertrochanteric hip fracture.  She underwent intramedullary nailing of the right femur by Dr. Posey Pronto on 01/28/2019.  GI has been asked to see this patient who has been dealing with chronic diarrhea in setting of Crohn's disease.  She also had hypokalemia and hypomagnesemia on presentation.  Her potassium has been corrected.  With regards to her Crohn's disease, patient reports that she is diagnosed about 30 years ago, was told that her disease is localized to terminal ileum only.  She has been on prednisone for several years, later tried various mesalamine formulations including Dipentum and Lialda with only partial response in her symptoms.  Later, she was started on azathioprine 150 mg daily which she has been on for at least 5 years started by her previous GI.  She has been seeing Dr. Allen Norris primarily for dysphagia since 2016 secondary to esophageal stricture underwent EGDs in the past with dilations.  Her last EGD was in 05/2017.  She told me that Dr. Allen Norris referred her to me for management of Crohn's about a year ago, however due to death in her family, she was not able to keep that appointment.  She saw Dr. Allen Norris about 6 months ago.  She is also diagnosed with osteoporosis by Dr. Allen Norris in 10/2018.  She is just taking calcium.  She does not know her vitamin D levels.  She is not on bisphosphonates  either.  She was told to follow-up with her primary care provider with regards to osteoporosis.  Patient reports that she has been dealing with symptoms related to Crohn's disease since her diagnosis, include postprandial urgency, several episodes of nonbloody mushy stools associated with intermittent episodes of nausea and vomiting, significant abdominal bloating.  Denies nocturnal diarrhea, denies perianal lesions, hematochezia, rectal bleeding, fever, chills.  She does report that sometimes she feels that she may have blockage in her small intestine.  She does have chronic iron deficiency anemia as well as B12 deficiency.  She was taking B12 injections in the past.  She denies having surgeries related to her Crohn's disease Patient's daughter is bedside.  Patient reports significant pain in her right hip, started physical therapy.  She has difficulty having to use the bathroom, using bedpan at present.  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   Wears dentures    full upper    Past Surgical History:  Procedure Laterality Date   COLONOSCOPY  02/2013   No polyps were found   ESOPHAGEAL DILATION  05/29/2017   Procedure: ESOPHAGEAL DILATION;  Surgeon: Lucilla Lame, MD;  Location: [place];  Service: Endoscopy;;   ESOPHAGOGASTRODUODENOSCOPY (EGD) WITH PROPOFOL N/A 06/28/2015    Procedure: ESOPHAGOGASTRODUODENOSCOPY (EGD) WITH PROPOFOL;  Surgeon: Lucilla Lame, MD;  Location: [place];  Service: Endoscopy;  Laterality: N/A;  with dialation   ESOPHAGOGASTRODUODENOSCOPY (EGD) WITH PROPOFOL N/A 07/26/2015   Procedure: ESOPHAGOGASTRODUODENOSCOPY (EGD) WITH PROPOFOL, with dialation;  Surgeon: Lucilla Lame, MD;  Location: [place];  Service: Endoscopy;  Laterality: N/A;  LEAVE PT LAST   ESOPHAGOGASTRODUODENOSCOPY (EGD) WITH PROPOFOL N/A 08/05/2016   Procedure: ESOPHAGOGASTRODUODENOSCOPY (EGD) WITH PROPOFOL;  Surgeon: Lucilla Lame, MD;  Location: [place];  Service: Endoscopy;  Laterality: N/A;   ESOPHAGOGASTRODUODENOSCOPY (EGD) WITH PROPOFOL N/A 09/26/2016   Procedure: ESOPHAGOGASTRODUODENOSCOPY (EGD) WITH PROPOFOL;  Surgeon: Lucilla Lame, MD;  Location: [place];  Service: Endoscopy;  Laterality: N/A;   ESOPHAGOGASTRODUODENOSCOPY (EGD) WITH PROPOFOL N/A 05/29/2017   Procedure: ESOPHAGOGASTRODUODENOSCOPY (EGD) WITH PROPOFOL - Injection of steroid in esophagus;  Surgeon: Lucilla Lame, MD;  Location: [place];  Service: Endoscopy;  Laterality: N/A;   Excision BCC  07/28/2013   right nares, Georgiann Cocker   TUBAL LIGATION  1986   UPPER GI ENDOSCOPY  02/2014    Prior to Admission medications   Medication Sig Start Date End Date Taking? Authorizing Provider  azaTHIOprine (IMURAN) 50 MG tablet Take 3 tablets (150 mg total) by mouth daily. 12/03/18  Yes Lucilla Lame, MD  clonazePAM (KLONOPIN) 1 MG tablet Take 0.5-1 tablets (0.5-1 mg total) by mouth as directed. Take half tablet at bedtime tuesdays and saturdays and continue 1 tablet rest of the days 09/30/18  Yes Eappen, Saramma, MD  FLUoxetine (PROZAC) 40 MG capsule TAKE 1 CAPSULE BY MOUTH TWICE A DAY Patient taking differently: Take 80 mg by mouth daily.  05/21/18  Yes Birdie Sons, MD  fluticasone (FLONASE) 50 MCG/ACT nasal spray Place 2 sprays into both nostrils daily. 02/16/18   Yes Birdie Sons, MD  HYDROcodone-acetaminophen (NORCO) 7.5-325 MG tablet Take 1 tablet by mouth every 6 (six) hours as needed for moderate pain. 01/15/19  Yes Birdie Sons, MD  lisinopril-hydrochlorothiazide (PRINZIDE,ZESTORETIC) 10-12.5 MG tablet TAKE 1 TABLET BY MOUTH DAILY. Patient taking differently: Take 0.5 tablets by mouth daily.  12/29/17  Yes Birdie Sons, MD  montelukast (SINGULAIR) 10 MG tablet TAKE 1 TABLET BY MOUTH EVERYDAY AT BEDTIME 08/21/18  Yes Birdie Sons, MD  pantoprazole (PROTONIX) 40 MG tablet TAKE 1 TABLET BY MOUTH TWICE A DAY 01/05/19  Yes Lucilla Lame, MD  SYMBICORT 80-4.5 MCG/ACT inhaler TAKE 2 PUFFS BY MOUTH TWICE A DAY 04/27/18  Yes Birdie Sons, MD  cyclobenzaprine (FLEXERIL) 5 MG tablet TAKE 1 TABLET BY MOUTH THREE TIMES A DAY AS NEEDED FOR MUSCLE SPASMS 12/20/18   Birdie Sons, MD  dicyclomine (BENTYL) 20 MG tablet TAKE 1 TABLET (20 MG TOTAL) BY MOUTH 3 (THREE) TIMES DAILY BEFORE MEALS. Patient not taking: Reported on 01/28/2019 12/19/17   Lucilla Lame, MD  diphenoxylate-atropine (LOMOTIL) 2.5-0.025 MG tablet Take 1 tablet by mouth 4 (four) times daily as needed  for diarrhea or loose stools. 05/30/17   Lucilla Lame, MD  doxycycline (VIBRA-TABS) 100 MG tablet Take 1 tablet (100 mg total) by mouth 2 (two) times daily. Patient not taking: Reported on 01/28/2019 09/26/18   Jerrol Banana., MD  fluconazole (DIFLUCAN) 100 MG tablet TAKE 1 TABLET BY MOUTH EVERY DAY Patient not taking: Reported on 01/28/2019 01/18/19   Birdie Sons, MD  mirtazapine (REMERON) 7.5 MG tablet TAKE 1 TABLET (7.5 MG TOTAL) BY MOUTH AT BEDTIME. FOR SLEEP Patient not taking: Reported on 01/28/2019 11/25/18   Ursula Alert, MD  oseltamivir (TAMIFLU) 75 MG capsule Take 1 capsule (75 mg total) by mouth 2 (two) times daily. Patient not taking: Reported on 01/28/2019 01/19/19   Chevis Pretty, FNP  pravastatin (PRAVACHOL) 10 MG tablet TAKE 1 TABLET BY MOUTH EVERY DAY 01/18/19    Birdie Sons, MD  promethazine (PHENERGAN) 25 MG tablet TAKE 1 TABLET BY MOUTH EVERY 6 HOURS AS NEEDED FOR NAUSEA AND VOMITING 12/03/18   Lucilla Lame, MD  promethazine-dextromethorphan (PROMETHAZINE-DM) 6.25-15 MG/5ML syrup Take 5 mLs by mouth 4 (four) times daily as needed for cough. Patient not taking: Reported on 01/28/2019 09/26/18   Jerrol Banana., MD  rOPINIRole (REQUIP) 0.5 MG tablet TAKE 1/2 TAB AT BEDTIME X3 DAYS, THEN 1 AT BEDTIME X3 DAYS, THEN INCREASE TO 2 AT BEDTIME IF NEEDED. 10/02/18   Birdie Sons, MD   Current Facility-Administered Medications:    acetaminophen (TYLENOL) tablet 1,000 mg, 1,000 mg, Oral, Q8H, Leim Fabry, MD, 1,000 mg at 01/29/19 1427   bisacodyl (DULCOLAX) suppository 10 mg, 10 mg, Rectal, Daily PRN, Leim Fabry, MD   budesonide (ENTOCORT EC) 24 hr capsule 9 mg, 9 mg, Oral, Daily, Yishai Rehfeld, Tally Due, MD, 9 mg at 01/29/19 1618   cyclobenzaprine (FLEXERIL) tablet 5 mg, 5 mg, Oral, TID PRN, Leim Fabry, MD, 5 mg at 01/29/19 0953   dicyclomine (BENTYL) tablet 20 mg, 20 mg, Oral, TID Jacqlyn Larsen, MD, 20 mg at 01/29/19 1617   enoxaparin (LOVENOX) injection 40 mg, 40 mg, Subcutaneous, Q24H, Leim Fabry, MD, 40 mg at 01/29/19 0973   feeding supplement (PRO-STAT SUGAR FREE 64) liquid 30 mL, 30 mL, Oral, BID, Leslye Peer, Richard, MD, 30 mL at 01/29/19 1428   FLUoxetine (PROZAC) capsule 80 mg, 80 mg, Oral, Daily, Leim Fabry, MD, 80 mg at 01/29/19 0953   HYDROmorphone (DILAUDID) injection 0.25-0.5 mg, 0.25-0.5 mg, Intravenous, Q2H PRN, Leim Fabry, MD, 0.5 mg at 01/29/19 1348   methocarbamol (ROBAXIN) tablet 500 mg, 500 mg, Oral, Q6H PRN, 500 mg at 01/29/19 1349 **OR** methocarbamol (ROBAXIN) 500 mg in dextrose 5 % 50 mL IVPB, 500 mg, Intravenous, Q6H PRN, Leim Fabry, MD   metoCLOPramide (REGLAN) tablet 5-10 mg, 5-10 mg, Oral, Q8H PRN **OR** metoCLOPramide (REGLAN) injection 5-10 mg, 5-10 mg, Intravenous, Q8H PRN, Leim Fabry, MD    mirtazapine (REMERON) tablet 7.5 mg, 7.5 mg, Oral, QHS, Patel, Tarry Kos, MD   mometasone-formoterol [facility]) 100-5 MCG/ACT inhaler 2 puff, 2 puff, Inhalation, BID, Leim Fabry, MD, 2 puff at 01/29/19 5329   multivitamin with minerals tablet 1 tablet, 1 tablet, Oral, Daily, Wieting, Richard, MD, 1 tablet at 01/29/19 1502   ondansetron (ZOFRAN) tablet 4 mg, 4 mg, Oral, Q6H PRN **OR** ondansetron (ZOFRAN) injection 4 mg, 4 mg, Intravenous, Q6H PRN, Leim Fabry, MD, 4 mg at 01/29/19 0149   oxyCODONE (Oxy IR/ROXICODONE) immediate release tablet 2.5-5 mg, 2.5-5 mg, Oral, Q3H PRN, Leim Fabry, MD   oxyCODONE (Oxy IR/ROXICODONE)  immediate release tablet 5-10 mg, 5-10 mg, Oral, Q4H PRN, Leim Fabry, MD   pantoprazole (PROTONIX) EC tablet 40 mg, 40 mg, Oral, BID, Leim Fabry, MD, 40 mg at 01/29/19 2878   pravastatin (PRAVACHOL) tablet 10 mg, 10 mg, Oral, Daily, Leim Fabry, MD, 10 mg at 01/29/19 1620   rifaximin (XIFAXAN) tablet 550 mg, 550 mg, Oral, BID, Tryniti Laatsch, Tally Due, MD, 550 mg at 01/29/19 1620   rOPINIRole (REQUIP) tablet 0.5 mg, 0.5 mg, Oral, QHS, Leim Fabry, MD, 0.5 mg at 01/28/19 2021   senna-docusate (Senokot-S) tablet 1 tablet, 1 tablet, Oral, QHS PRN, Leim Fabry, MD   sodium phosphate (FLEET) 7-19 GM/118ML enema 1 enema, 1 enema, Rectal, Once PRN, Leim Fabry, MD   traMADol Veatrice Bourbon) tablet 50 mg, 50 mg, Oral, Q6H PRN, Leim Fabry, MD   Current Facility-Administered Medications:    acetaminophen (TYLENOL) tablet 1,000 mg, 1,000 mg, Oral, Q8H, Leim Fabry, MD, 1,000 mg at 01/29/19 1427   bisacodyl (DULCOLAX) suppository 10 mg, 10 mg, Rectal, Daily PRN, Leim Fabry, MD   budesonide (ENTOCORT EC) 24 hr capsule 9 mg, 9 mg, Oral, Daily, Alaa Eyerman, Tally Due, MD, 9 mg at 01/29/19 1618   cyclobenzaprine (FLEXERIL) tablet 5 mg, 5 mg, Oral, TID PRN, Leim Fabry, MD, 5 mg at 01/29/19 0953   dicyclomine (BENTYL) tablet 20 mg, 20 mg, Oral, TID Jacqlyn Larsen, MD, 20 mg at  01/29/19 1617   enoxaparin (LOVENOX) injection 40 mg, 40 mg, Subcutaneous, Q24H, Leim Fabry, MD, 40 mg at 01/29/19 6767   feeding supplement (PRO-STAT SUGAR FREE 64) liquid 30 mL, 30 mL, Oral, BID, Leslye Peer, Richard, MD, 30 mL at 01/29/19 1428   FLUoxetine (PROZAC) capsule 80 mg, 80 mg, Oral, Daily, Leim Fabry, MD, 80 mg at 01/29/19 2094   HYDROmorphone (DILAUDID) injection 0.25-0.5 mg, 0.25-0.5 mg, Intravenous, Q2H PRN, Leim Fabry, MD, 0.5 mg at 01/29/19 1348   methocarbamol (ROBAXIN) tablet 500 mg, 500 mg, Oral, Q6H PRN, 500 mg at 01/29/19 1349 **OR** methocarbamol (ROBAXIN) 500 mg in dextrose 5 % 50 mL IVPB, 500 mg, Intravenous, Q6H PRN, Leim Fabry, MD   metoCLOPramide (REGLAN) tablet 5-10 mg, 5-10 mg, Oral, Q8H PRN **OR** metoCLOPramide (REGLAN) injection 5-10 mg, 5-10 mg, Intravenous, Q8H PRN, Leim Fabry, MD   mirtazapine (REMERON) tablet 7.5 mg, 7.5 mg, Oral, QHS, Leim Fabry, MD   mometasone-formoterol [facility]) 100-5 MCG/ACT inhaler 2 puff, 2 puff, Inhalation, BID, Leim Fabry, MD, 2 puff at 01/29/19 7096   multivitamin with minerals tablet 1 tablet, 1 tablet, Oral, Daily, Loletha Grayer, MD, 1 tablet at 01/29/19 1502   ondansetron (ZOFRAN) tablet 4 mg, 4 mg, Oral, Q6H PRN **OR** ondansetron (ZOFRAN) injection 4 mg, 4 mg, Intravenous, Q6H PRN, Leim Fabry, MD, 4 mg at 01/29/19 0149   oxyCODONE (Oxy IR/ROXICODONE) immediate release tablet 2.5-5 mg, 2.5-5 mg, Oral, Q3H PRN, Leim Fabry, MD   oxyCODONE (Oxy IR/ROXICODONE) immediate release tablet 5-10 mg, 5-10 mg, Oral, Q4H PRN, Leim Fabry, MD   pantoprazole (PROTONIX) EC tablet 40 mg, 40 mg, Oral, BID, Leim Fabry, MD, 40 mg at 01/29/19 2836   pravastatin (PRAVACHOL) tablet 10 mg, 10 mg, Oral, Daily, Leim Fabry, MD, 10 mg at 01/29/19 1620   rifaximin (XIFAXAN) tablet 550 mg, 550 mg, Oral, BID, Kamilla Hands, Tally Due, MD, 550 mg at 01/29/19 1620   rOPINIRole (REQUIP) tablet 0.5 mg, 0.5 mg, Oral, QHS, Leim Fabry,  MD, 0.5 mg at 01/28/19 2021   senna-docusate (Senokot-S) tablet 1 tablet, 1 tablet, Oral, QHS PRN,  Leim Fabry, MD   sodium phosphate (FLEET) 7-19 GM/118ML enema 1 enema, 1 enema, Rectal, Once PRN, Leim Fabry, MD   traMADol Veatrice Bourbon) tablet 50 mg, 50 mg, Oral, Q6H PRN, Leim Fabry, MD  Family History  Problem Relation Age of Onset   COPD Mother    Heart disease Mother    COPD Father    Emphysema Father    Prostate cancer Father    Alcohol abuse Father    Anxiety disorder Father    Depression Father    Schizophrenia Father    COPD Sister    Heart disease Sister    Alcohol abuse Sister    Anxiety disorder Sister    Depression Sister    Alcohol abuse Brother    Alcohol abuse Sister    Anxiety disorder Sister    Depression Sister    Alcohol abuse Sister    Anxiety disorder Sister    Depression Sister    Anxiety disorder Sister    Depression Sister    Anxiety disorder Sister    Depression Sister    Drug abuse Son    Drug abuse Other    Drug abuse Other    Drug abuse Other      Social History   Tobacco Use   Smoking status: Former Smoker    Packs/day: 1.00    Years: 10.00    Pack years: 10.00    Types: Cigarettes    Last attempt to quit: 11/18/2012    Years since quitting: 6.2   Smokeless tobacco: Never Used   Tobacco comment: smoked up to 1 ppd for about 10 years.   Substance Use Topics   Alcohol use: Not Currently    Alcohol/week: 0.0 standard drinks    Comment: every once in a while   Drug use: No    Allergies as of 01/27/2019 - Review Complete 01/27/2019  Allergen Reaction Noted   Nsaids Other (See Comments) 04/21/2014   Sulfa antibiotics Rash 04/21/2014    Review of Systems:    All systems reviewed and negative except where noted in HPI.   Physical Exam:  Vital signs in last 24 hours: Temp:  [97 F (36.1 C)-99.5 F (37.5 C)] 99.5 F (37.5 C) (03/13 1525) Pulse Rate:  [79-88] 85 (03/13 1525) Resp:  [14-23]  20 (03/13 1525) BP: (94-112)/(42-70) 99/58 (03/13 1525) SpO2:  [93 %-100 %] 98 % (03/13 1525) Last BM Date: 01/27/19 General:   Pleasant, cooperative, obese in NAD Head:  Normocephalic and atraumatic. Eyes:   No icterus.   Conjunctiva pink. PERRLA. Ears:  Normal auditory acuity. Neck:  Supple; Short and thick neck, no masses or thyroidomegaly Lungs: Respirations even and unlabored. Lungs clear to auscultation bilaterally.   No wheezes, crackles, or rhonchi.  Heart:  Regular rate and rhythm;  Without murmur, clicks, rubs or gallops Abdomen:  Soft, moderately distended, tympanic to percussion nontender. Normal bowel sounds. No appreciable masses or hepatomegaly.  No rebound or guarding.  Rectal:  Not performed. Msk: Dressing on the right hip from recent surgery  Extremities:  Without edema, cyanosis or clubbing. Neurologic:  Alert and oriented x3;  grossly normal neurologically. Skin:  Intact without significant lesions or rashes. Psych:  Alert and cooperative. Normal affect.  LAB RESULTS: CBC Latest Ref Rng & Units 01/29/2019 01/27/2019 07/30/2018  WBC 4.0 - 10.5 K/uL 2.4(L) 3.4(L) 4.5  Hemoglobin 12.0 - 15.0 g/dL 7.4(L) 9.2(L) 11.1  Hematocrit 36.0 - 46.0 % 23.3(L) 26.9(L) 31.9(L)  Platelets 150 - 400 K/uL  231 290 321    BMET BMP Latest Ref Rng & Units 01/29/2019 01/28/2019 01/27/2019  Glucose 70 - 99 mg/dL 199(H) 117(H) 122(H)  BUN 8 - 23 mg/dL 10 10 9   Creatinine 0.44 - 1.00 mg/dL 0.97 0.97 0.92  BUN/Creat Ratio 12 - 28 - - -  Sodium 135 - 145 mmol/L 140 137 137  Potassium 3.5 - 5.1 mmol/L 3.7 3.2(L) <2.0(LL)  Chloride 98 - 111 mmol/L 105 99 96(L)  CO2 22 - 32 mmol/L 24 29 27   Calcium 8.9 - 10.3 mg/dL 7.4(L) 7.9(L) 7.6(L)    LFT Hepatic Function Latest Ref Rng & Units 01/27/2019 07/30/2018 07/29/2017  Total Protein 6.5 - 8.1 g/dL 5.8(L) 6.7 5.9(L)  Albumin 3.5 - 5.0 g/dL 2.3(L) 3.8 -  AST 15 - 41 U/L 48(H) 28 15  ALT 0 - 44 U/L 18 13 7   Alk Phosphatase 38 - 126 U/L 154(H) 98 -    Total Bilirubin 0.3 - 1.2 mg/dL 1.0 0.4 0.3  Bilirubin, Direct 0.1 - 0.5 mg/dL - - -     STUDIES: Dg Chest 1 View  Result Date: 01/27/2019 CLINICAL DATA:  Right hip pain pain after fall. EXAM: CHEST  1 VIEW COMPARISON:  Report from 12/05/2008 FINDINGS: The lung volumes are low. The cardiopericardial silhouette is mildly enlarged in part due to the AP projection. No acute pulmonary consolidation, effusion or pneumothorax. Retrocardiac opacities may reflect atelectasis. No acute displaced fracture. No abnormal fluid collections. IMPRESSION: No active disease. Mildly enlarged cardiopericardial silhouette in part due to the AP projection. Electronically Signed   By: Ashley Royalty M.D.   On: 01/27/2019 23:46   Dg Pelvis 1-2 Views  Result Date: 01/27/2019 CLINICAL DATA:  Right hip pain and deformity. EXAM: PELVIS - 1-2 VIEW COMPARISON:  None. FINDINGS: There is an acute, comminuted, varus angulated closed fracture of the proximal left femur involving the basicervical portion of the femur. This fracture undermines the greater trochanter in exits in the subtrochanteric portion of the femur. Avulsed lesser trochanter is also suggested. No joint dislocation. No pelvic diastasis. Lower lumbar degenerative disc and endplate changes are noted from L3 through S1. The native left hip is intact with degenerative joint space narrowing. IMPRESSION: Acute, varus angulated, basicervical fracture of the right femur extending through the intertrochanteric portion of the femur and undermining the greater trochanter. Avulsed lesser trochanter is also seen. Electronically Signed   By: Ashley Royalty M.D.   On: 01/27/2019 23:52   Dg Hip Operative Unilat W Or W/o Pelvis Right  Result Date: 01/28/2019 CLINICAL DATA:  Intraoperative imaging for ORIF of a proximal right femur fracture. EXAM: OPERATIVE RIGHT HIP (WITH PELVIS IF PERFORMED) 9 VIEWS TECHNIQUE: Fluoroscopic spot image(s) were submitted for interpretation  post-operatively. COMPARISON:  01/27/2019 FINDINGS: Images show placement of a long intramedullary rod supporting a compression screw, reducing the primary fracture components of the displaced, comminuted intertrochanteric fracture into near anatomic alignment. The orthopedic hardware appears well seated. IMPRESSION: Well aligned proximal right femur fracture following ORIF. Electronically Signed   By: Lajean Manes M.D.   On: 01/28/2019 17:30   Dg Femur Min 2 Views Right  Result Date: 01/27/2019 CLINICAL DATA:  Right hip pain and deformity EXAM: RIGHT FEMUR 2 VIEWS COMPARISON:  None. FINDINGS: There is an acute, comminuted, varus angulated closed fracture of the proximal left femur involving the basicervical portion of the femur. This fracture undermines the greater trochanter in exits in the subtrochanteric portion of the femur. Avulsed lesser trochanter is also  suggested. No joint dislocation. The included pubic rami appear intact. No pelvic diastasis is seen. Osteopenic appearance of the included knee with tricompartmental osteoarthritis and spurring noted. No joint effusion is identified though the study is somewhat limited due to obliquity. If the patient is symptomatic over the knee, dedicated knee radiographs are recommended for better assessment. IMPRESSION: Acute, varus angulated, basicervical fracture of the right femur extending through the intertrochanteric portion of the femur and undermining the greater trochanter. Avulsed lesser trochanter is also seen. Electronically Signed   By: Ashley Royalty M.D.   On: 01/27/2019 23:49      Impression / Plan:   Kimberly Hudson is a 63 y.o. pleasant Caucasian female with h/o crohn's disease presumably small bowel, not in remission, long term prednisone use, followed by mesalamine, currently maintained on azathioprine, steroid induced obesity, osteoporosis complicated by right intertrochanteric fracture s/p ORIF POD#1 consulted for management of Crohn's  disease, chronic diarrhea  Crohn's disease, presumably small bowel, resulting in abdominal cramps, postprandial urgency and diarrhea, nausea and vomiting.  Her clinical course ease concerning for terminal ileal stricture with episodes of partial small bowel obstruction  Okay with full liquid diet as patient is having bowel movements Ideally, she will need colonoscopy with evaluation of the terminal ileum.  She is just POD#1 from hip fracture repair, bowel prep will be technically challenging and risk for surgical wound infection from several bowel movements with bowel prep.  Therefore, recommend CT enterography as inpatient.  Based on the CT enterography results, will determine surgical or medical options.  Patient has history of basal cell carcinoma, would not recommend anti-TNF therapy.  Ustekinumab or vedolizumab would be good options Start rifaximin for possible small intestine bacterial overgrowth Recommend budesonide 3 mg 3 capsules daily Stop azathioprine while inpatient She has severe B12 deficiency, recommend B12 subcut every other day and continue weekly as outpatient Check fecal calprotectin levels, TPMT, viral hepatitis panel, CRP, vitamin D levels She will need to follow-up with endocrinology for bisphosphonate therapy She will need close follow-up with GI for management of her Crohn's disease upon discharge  Thank you for involving me in the care of this patient.  GI will continue to follow along with you    LOS: 1 day   Sherri Sear, MD  01/29/2019, 5:12 PM   Note: This dictation was prepared with Dragon dictation along with smaller phrase technology. Any transcriptional errors that result from this process are unintentional.

## 2019-01-29 NOTE — Evaluation (Signed)
Physical Therapy Evaluation Patient Details Name: Kimberly Hudson MRN: [id number] DOB: 1956/08/20 Today's Date: 01/29/2019   History of Present Illness  Pt is a 63 yo female with a PMH that includes Crohn's disease, anxiety, CAD, depression, GERD, and HTN presented to the ED after a fall resulting in a R intertrochanteric hip fracture.  Pt is s/p intramedullary nailing of R femur with cephalomedullary device.  MD assessment also includes: weakness, chronic diarrhea, hypochloremia, hyperglycemia, hypocalcemia, transaminasemia, hypoproteinemia/hypoalbuminemia, leukopenia (w/o neutropenia), and macrocytic anemia.     Clinical Impression  Pt presents with deficits in strength, transfers, mobility, gait, balance, and activity tolerance.  Pt was limited by R hip pain with all functional mobility during the session.  Pt required extensive +2 assist with bed mobility tasks and to stand from an elevated EOB.  Pt was able to take 2-3 very small steps at the EOB with min A.  Pt provided with cues with transfers and gait to ensure RLE WB compliance.  Pt will benefit from PT services in a SNF setting upon discharge to safely address above deficits for decreased caregiver assistance and eventual return to PLOF.      Follow Up Recommendations SNF    Equipment Recommendations  Other (comment)(TBD at next venue of care)    Recommendations for Other Services       Precautions / Restrictions Precautions Precautions: Fall Restrictions Weight Bearing Restrictions: Yes RLE Weight Bearing: Touchdown weight bearing Other Position/Activity Restrictions: Pt is TDWB with the foot to be placed flat on the floor with ambulation (FFWB) with the weight of the leg only      Mobility  Bed Mobility Overal bed mobility: Needs Assistance Bed Mobility: Supine to Sit;Sit to Supine     Supine to sit: +2 for physical assistance;Max assist Sit to supine: +2 for physical assistance;Max assist   General bed mobility  comments: All bed mobility tasks limited by RLE pain with movement  Transfers Overall transfer level: Needs assistance Equipment used: Rolling walker (2 wheeled) Transfers: Sit to/from Stand Sit to Stand: +2 physical assistance;Mod assist         General transfer comment: Mod verbal cues for sequencing for RLE WB compliance  Ambulation/Gait Ambulation/Gait assistance: Min assist Gait Distance (Feet): 1 Feet Assistive device: Rolling walker (2 wheeled) Gait Pattern/deviations: Step-to pattern;Antalgic Gait velocity: decreased   General Gait Details: Mod verbal cues for sequencing for RLE WB compliance with min A provided for stability  Stairs            Wheelchair Mobility    Modified Rankin (Stroke Patients Only)       Balance Overall balance assessment: Needs assistance   Sitting balance-Leahy Scale: Good     Standing balance support: Bilateral upper extremity supported Standing balance-Leahy Scale: Fair                               Pertinent Vitals/Pain Pain Assessment: 0-10 Pain Score: 4  Pain Location: R hip Pain Descriptors / Indicators: Aching;Sore Pain Intervention(s): Limited activity within patient's tolerance;Premedicated before session;Monitored during session    Home Living Family/patient expects to be discharged to:: Private residence Living Arrangements: Children;Other relatives(daughter and grandson) Available Help at Discharge: Family;Available 24 hours/day Type of Home: Mobile home Home Access: Stairs to enter Entrance Stairs-Rails: Right;Left;Can reach both Entrance Stairs-Number of Steps: 6 Home Layout: One level Home Equipment: Clinical cytogeneticist - 2 wheels;Cane - single point;Cane - quad;Walker - 4 wheels;Wheelchair -  manual Additional Comments: Has access to a w/c from a friend    Prior Function Level of Independence: Independent with assistive device(s)         Comments: Mod Ind amb community distances with a  SPC, 2 falls in the last 6 months, Ind with ADLs     Hand Dominance        Extremity/Trunk Assessment   Upper Extremity Assessment Upper Extremity Assessment: Overall WFL for tasks assessed    Lower Extremity Assessment Lower Extremity Assessment: Generalized weakness;RLE deficits/detail RLE Deficits / Details: All RLE movement very limited by pain RLE: Unable to fully assess due to pain       Communication   Communication: No difficulties  Cognition Arousal/Alertness: Awake/alert Behavior During Therapy: WFL for tasks assessed/performed Overall Cognitive Status: Within Functional Limits for tasks assessed                                        General Comments      Exercises Total Joint Exercises Ankle Circles/Pumps: AROM;Both;5 reps;10 reps Quad Sets: Strengthening;Both;5 reps;10 reps Gluteal Sets: Strengthening;Both;5 reps;10 reps Short Arc Quad: AROM;AAROM;Right;5 reps;10 reps Heel Slides: AAROM;Right;5 reps(Min amplitude) Hip ABduction/ADduction: AROM;Left;10 reps Straight Leg Raises: AROM;Left;10 reps Long Arc Quad: AROM;Left;10 reps Knee Flexion: AROM;Left;10 reps Other Exercises Other Exercises: HEP education for BLE APs, QS, and GS x 10 each every 1-2 hours daily   Assessment/Plan    PT Assessment Patient needs continued PT services  PT Problem List Decreased strength;Decreased activity tolerance;Decreased balance;Decreased mobility;Decreased knowledge of use of DME;Decreased knowledge of precautions       PT Treatment Interventions DME instruction;Gait training;Stair training;Functional mobility training;Therapeutic activities;Therapeutic exercise;Balance training;Patient/family education    PT Goals (Current goals can be found in the Care Plan section)  Acute Rehab PT Goals Patient Stated Goal: To walk without falling down PT Goal Formulation: With patient Time For Goal Achievement: 02/11/19 Potential to Achieve Goals: Good     Frequency BID   Barriers to discharge Inaccessible home environment      Co-evaluation               AM-PAC PT "6 Clicks" Mobility  Outcome Measure Help needed turning from your back to your side while in a flat bed without using bedrails?: A Lot Help needed moving from lying on your back to sitting on the side of a flat bed without using bedrails?: Total Help needed moving to and from a bed to a chair (including a wheelchair)?: A Lot Help needed standing up from a chair using your arms (e.g., wheelchair or bedside chair)?: A Lot Help needed to walk in hospital room?: Total Help needed climbing 3-5 steps with a railing? : Total 6 Click Score: 9    End of Session Equipment Utilized During Treatment: Gait belt Activity Tolerance: Patient limited by pain Patient left: in bed;with call bell/phone within reach;with bed alarm set;with SCD's reapplied Nurse Communication: Mobility status PT Visit Diagnosis: Unsteadiness on feet (R26.81);History of falling (Z91.81);Other abnormalities of gait and mobility (R26.89);Muscle weakness (generalized) (M62.81)    Time: 7858-8502 PT Time Calculation (min) (ACUTE ONLY): 46 min   Charges:   PT Evaluation $PT Eval Low Complexity: 1 Low PT Treatments $Therapeutic Exercise: 8-22 mins        D. Royetta Asal PT, DPT 01/29/19, 12:14 PM

## 2019-01-30 DIAGNOSIS — K56699 Other intestinal obstruction unspecified as to partial versus complete obstruction: Secondary | ICD-10-CM

## 2019-01-30 LAB — CBC
HCT: 20.9 % — ABNORMAL LOW (ref 36.0–46.0)
Hemoglobin: 6.7 g/dL — ABNORMAL LOW (ref 12.0–15.0)
MCH: 38.7 pg — ABNORMAL HIGH (ref 26.0–34.0)
MCHC: 32.1 g/dL (ref 30.0–36.0)
MCV: 120.8 fL — ABNORMAL HIGH (ref 80.0–100.0)
Platelets: 194 10*3/uL (ref 150–400)
RBC: 1.73 MIL/uL — ABNORMAL LOW (ref 3.87–5.11)
RDW: 19.8 % — ABNORMAL HIGH (ref 11.5–15.5)
WBC: 2.6 10*3/uL — ABNORMAL LOW (ref 4.0–10.5)
nRBC: 1.2 % — ABNORMAL HIGH (ref 0.0–0.2)

## 2019-01-30 LAB — BASIC METABOLIC PANEL
Anion gap: 7 (ref 5–15)
BUN: 9 mg/dL (ref 8–23)
CO2: 30 mmol/L (ref 22–32)
Calcium: 7.7 mg/dL — ABNORMAL LOW (ref 8.9–10.3)
Chloride: 105 mmol/L (ref 98–111)
Creatinine, Ser: 0.83 mg/dL (ref 0.44–1.00)
GFR calc Af Amer: >60 mL/min (ref >60)
GFR calc non Af Amer: >60 mL/min (ref >60)
Glucose, Bld: 103 mg/dL — ABNORMAL HIGH (ref 70–99)
Potassium: 3.7 mmol/L (ref 3.5–5.1)
SODIUM: 142 mmol/L (ref 135–145)

## 2019-01-30 LAB — PHOSPHORUS: Phosphorus: 4 mg/dL (ref 2.5–4.6)

## 2019-01-30 LAB — PREPARE RBC (CROSSMATCH)

## 2019-01-30 LAB — MAGNESIUM: Magnesium: 2.1 mg/dL (ref 1.7–2.4)

## 2019-01-30 LAB — C-REACTIVE PROTEIN: CRP: 5.6 mg/dL — ABNORMAL HIGH (ref <1.0)

## 2019-01-30 LAB — ABO/RH: ABO/RH(D): O POS

## 2019-01-30 MED ORDER — SODIUM CHLORIDE 0.9% IV SOLUTION: Freq: Once | INTRAVENOUS | Status: DC

## 2019-01-30 MED ORDER — SODIUM CHLORIDE 0.9 % IV SOLN
400.0000 mg | Freq: Once | INTRAVENOUS | Status: AC
  Administered 2019-01-30: 400 mg via INTRAVENOUS
  Filled 2019-01-30: qty 20

## 2019-01-30 MED ORDER — CYANOCOBALAMIN 1000 MCG/ML IJ SOLN
1000.0000 ug | Freq: Once | INTRAMUSCULAR | Status: AC
  Administered 2019-01-30: 1000 ug via SUBCUTANEOUS
  Filled 2019-01-30: qty 1

## 2019-01-30 NOTE — Progress Notes (Signed)
Kimberly Darby, MD [address]  [place], [place] [address]  Main: [phone]  Fax: [phone] Pager: [phone]   Subjective: She underwent CT enterography yesterday, subsequently had several bowel movements from the barium contrast.  She reports being less distended in her abdomen today.  Denies any complaints.  Tolerating full liquid diet well.  Both her daughters at bedside.  Objective: Vital signs in last 24 hours: Vitals:   01/30/19 1123 01/30/19 1203 01/30/19 1500 01/30/19 1522  BP: 108/65 (!) 98/58 (!) 93/47 117/68  Pulse: 85 93 78 81  Resp: 19 18 18 16   Temp: 98.8 F (37.1 C) 99.1 F (37.3 C) 98.9 F (37.2 C) 98.1 F (36.7 C)  TempSrc: Oral Oral Oral Oral  SpO2: 100% 93% 96% 94%  Weight:      Height:       Weight change:   Intake/Output Summary (Last 24 hours) at 01/30/2019 1908 Last data filed at 01/30/2019 1752 Gross per 24 hour  Intake 1076 ml  Output 132 ml  Net 944 ml     Exam: Heart:: Regular rate and rhythm, S1S2 present or without murmur or extra heart sounds Lungs: normal and clear to auscultation Abdomen: soft, nontender, normal bowel sounds   Lab Results: CBC Latest Ref Rng & Units 01/30/2019 01/29/2019 01/27/2019  WBC 4.0 - 10.5 K/uL 2.6(L) 2.4(L) 3.4(L)  Hemoglobin 12.0 - 15.0 g/dL 6.7(L) 7.4(L) 9.2(L)  Hematocrit 36.0 - 46.0 % 20.9(L) 23.3(L) 26.9(L)  Platelets 150 - 400 K/uL 194 231 290   CMP Latest Ref Rng & Units 01/30/2019 01/29/2019 01/28/2019  Glucose 70 - 99 mg/dL 103(H) 199(H) 117(H)  BUN 8 - 23 mg/dL 9 10 10   Creatinine 0.44 - 1.00 mg/dL 0.83 0.97 0.97  Sodium 135 - 145 mmol/L 142 140 137  Potassium 3.5 - 5.1 mmol/L 3.7 3.7 3.2(L)  Chloride 98 - 111 mmol/L 105 105 99  CO2 22 - 32 mmol/L 30 24 29   Calcium 8.9 - 10.3 mg/dL 7.7(L) 7.4(L) 7.9(L)  Total Protein 6.5 - 8.1 g/dL - - -  Total Bilirubin 0.3 - 1.2 mg/dL - - -  Alkaline Phos 38 - 126 U/L - - -  AST 15 - 41 U/L - - -  ALT 0 - 44 U/L - - -    Micro Results: No results found for this or any previous visit (from the past 240 hour(s)). Studies/Results: Ct Entero Abd/pelvis W Contast  Result Date: 01/29/2019 CLINICAL DATA:  Inpatient. Episodes of nausea, bloating and diarrhea. Reported history of poorly controlled Crohn disease. Recent right hip fracture. EXAM: CT ABDOMEN AND PELVIS WITH CONTRAST (ENTEROGRAPHY) TECHNIQUE: Multidetector CT of the abdomen and pelvis during bolus administration of intravenous contrast. Negative oral contrast was given. CONTRAST:  134m OMNIPAQUE IOHEXOL 300 MG/ML  SOLN COMPARISON:  01/27/2019 pelvic radiograph. FINDINGS: Lower chest: Minimal patchy tree-in-bud opacity in the right middle lobe. Hepatobiliary: Normal liver size. Granulomatous posterior right liver lobe calcification. No liver mass. Cholelithiasis. No gallbladder distention. No definite gallbladder wall thickening. No pericholecystic fluid. No biliary ductal dilatation. Pancreas: Normal, with no mass or duct dilation. Spleen: Normal size. No mass. Adrenals/Urinary Tract: Right adrenal 1.9 cm nodule with density 24 HU. Left adrenal 2.4 cm nodule with density 40 HU. Normal kidneys with no hydronephrosis and no renal mass. Tiny foci of gas in the nondependent bladder lumen, presumably due to recent instrumentation. Otherwise normal bladder. Stomach/Bowel: Normal stomach. There are multiple moderately dilated loops distal jejunum and ileum  throughout the abdomen with intervening strictured segments of appearing length, longest stricture measuring proximally 6 cm in length (series 3/image 172). There is mild wall thickening and mild mucosal hyperenhancement at the areas of stricturing without significant surrounding inflammatory fat stranding or mesenteric edema. The terminal ileum is relatively collapsed and demonstrates mild mucosal hyperenhancement and fatty mural replacement. Normal appendix. Relatively collapsed large bowel, with no large bowel wall  thickening, diverticulosis or pericolonic fat stranding. No discrete bowel fistula. Vascular/Lymphatic: Atherosclerotic nonaneurysmal abdominal aorta. Patent portal, splenic, hepatic and renal veins. No pathologically enlarged lymph nodes in the abdomen or pelvis. Reproductive: Grossly normal uterus.  No adnexal mass. Other: No pneumoperitoneum, ascites or focal fluid collection. Musculoskeletal: No aggressive appearing focal osseous lesions. Partially visualized expected postsurgical changes from ORIF of the comminuted intertrochanteric proximal right femur fracture, which is in near-anatomic alignment. Mild thoracolumbar spondylosis. IMPRESSION: 1. CT enterography findings are compatible with chronic fibrostenotic disease in the mid to distal small bowel with multiple strictures and intervening dilated small bowel loops. No bowel fistula or abscess. 2. Indeterminate bilateral adrenal nodules, presumably representing adenomas assuming no history of malignancy. Suggests adrenal protocol CT abdomen without and with IV contrast in 12 months. This recommendation follows ACR consensus guidelines: Management of Incidental Adrenal Masses: A White Paper of the ACR Incidental Findings Committee. J Am Coll Radiol 2017;14:1038-1044. 3. Minimal patchy tree-in-bud opacity in the right middle lobe compatible with minimal bronchiolitis of uncertain clinical significance. 4. Cholelithiasis. 5. Postsurgical changes from ORIF intertrochanteric right femur fracture. 6. Tiny foci of gas in the nondependent bladder lumen, presumably due to recent bladder instrumentation. 7.  Aortic Atherosclerosis (ICD10-I70.0). Electronically Signed   By: Ilona Sorrel M.D.   On: 01/29/2019 19:21   Medications:  I have reviewed the patient's current medications. Prior to Admission:  Medications Prior to Admission  Medication Sig Dispense Refill Last Dose   azaTHIOprine (IMURAN) 50 MG tablet Take 3 tablets (150 mg total) by mouth daily. 90  tablet 6 01/27/2019 at 0800   clonazePAM (KLONOPIN) 1 MG tablet Take 0.5-1 tablets (0.5-1 mg total) by mouth as directed. Take half tablet at bedtime tuesdays and saturdays and continue 1 tablet rest of the days 30 tablet 5 01/26/2019 at 2100   FLUoxetine (PROZAC) 40 MG capsule TAKE 1 CAPSULE BY MOUTH TWICE A DAY (Patient taking differently: Take 80 mg by mouth daily. ) 180 capsule 2 01/27/2019 at 0800   fluticasone (FLONASE) 50 MCG/ACT nasal spray Place 2 sprays into both nostrils daily. 16 g 6 01/27/2019 at 0800   HYDROcodone-acetaminophen (NORCO) 7.5-325 MG tablet Take 1 tablet by mouth every 6 (six) hours as needed for moderate pain. 120 tablet 0 01/27/2019 at 0600   lisinopril-hydrochlorothiazide (PRINZIDE,ZESTORETIC) 10-12.5 MG tablet TAKE 1 TABLET BY MOUTH DAILY. (Patient taking differently: Take 0.5 tablets by mouth daily. ) 90 tablet 4 01/27/2019 at 0800   montelukast (SINGULAIR) 10 MG tablet TAKE 1 TABLET BY MOUTH EVERYDAY AT BEDTIME 90 tablet 4 2100 at Unknown time   pantoprazole (PROTONIX) 40 MG tablet TAKE 1 TABLET BY MOUTH TWICE A DAY 60 tablet 6 01/27/2019 at 0800   SYMBICORT 80-4.5 MCG/ACT inhaler TAKE 2 PUFFS BY MOUTH TWICE A DAY 10.2 Inhaler 11 01/27/2019 at 0800   cyclobenzaprine (FLEXERIL) 5 MG tablet TAKE 1 TABLET BY MOUTH THREE TIMES A DAY AS NEEDED FOR MUSCLE SPASMS 90 tablet 5 prn at prn   dicyclomine (BENTYL) 20 MG tablet TAKE 1 TABLET (20 MG TOTAL) BY MOUTH 3 (THREE) TIMES  DAILY BEFORE MEALS. (Patient not taking: Reported on 01/28/2019) 90 tablet 5 Not Taking at Unknown time   diphenoxylate-atropine (LOMOTIL) 2.5-0.025 MG tablet Take 1 tablet by mouth 4 (four) times daily as needed for diarrhea or loose stools. 120 tablet 5 prn at prn   doxycycline (VIBRA-TABS) 100 MG tablet Take 1 tablet (100 mg total) by mouth 2 (two) times daily. (Patient not taking: Reported on 01/28/2019) 20 tablet 0 Completed Course at Unknown time   fluconazole (DIFLUCAN) 100 MG tablet TAKE 1 TABLET  BY MOUTH EVERY DAY (Patient not taking: Reported on 01/28/2019) 14 tablet 2 Completed Course at Unknown time   mirtazapine (REMERON) 7.5 MG tablet TAKE 1 TABLET (7.5 MG TOTAL) BY MOUTH AT BEDTIME. FOR SLEEP (Patient not taking: Reported on 01/28/2019) 30 tablet 1 Not Taking at Unknown time   oseltamivir (TAMIFLU) 75 MG capsule Take 1 capsule (75 mg total) by mouth 2 (two) times daily. (Patient not taking: Reported on 01/28/2019) 10 capsule 0 Completed Course at Unknown time   pravastatin (PRAVACHOL) 10 MG tablet TAKE 1 TABLET BY MOUTH EVERY DAY 90 tablet 4 01/26/2019 at 1800   promethazine (PHENERGAN) 25 MG tablet TAKE 1 TABLET BY MOUTH EVERY 6 HOURS AS NEEDED FOR NAUSEA AND VOMITING 30 tablet 2 prn at prn   promethazine-dextromethorphan (PROMETHAZINE-DM) 6.25-15 MG/5ML syrup Take 5 mLs by mouth 4 (four) times daily as needed for cough. (Patient not taking: Reported on 01/28/2019) 118 mL 0 Completed Course at Unknown time   rOPINIRole (REQUIP) 0.5 MG tablet TAKE 1/2 TAB AT BEDTIME X3 DAYS, THEN 1 AT BEDTIME X3 DAYS, THEN INCREASE TO 2 AT BEDTIME IF NEEDED. 30 tablet 11 01/26/2019 at 2100   Scheduled:  sodium chloride   Intravenous Once   budesonide  9 mg Oral Daily   [START ON 01/31/2019] cyanocobalamin  1,000 mcg Subcutaneous Once   dicyclomine  20 mg Oral TID AC   enoxaparin (LOVENOX) injection  40 mg Subcutaneous Q24H   feeding supplement (PRO-STAT SUGAR FREE 64)  30 mL Oral BID   FLUoxetine  80 mg Oral Daily   mirtazapine  7.5 mg Oral QHS   mometasone-formoterol  2 puff Inhalation BID   multivitamin with minerals  1 tablet Oral Daily   pantoprazole  40 mg Oral BID   pravastatin  10 mg Oral Daily   rifaximin  550 mg Oral BID   rOPINIRole  0.5 mg Oral QHS   Continuous:  iron sucrose 400 mg (01/30/19 1752)   methocarbamol (ROBAXIN) IV     OZD:[id number], cyclobenzaprine, HYDROmorphone (DILAUDID) injection, methocarbamol **OR** methocarbamol (ROBAXIN) IV, metoCLOPramide  **OR** metoCLOPramide (REGLAN) injection, ondansetron **OR** ondansetron (ZOFRAN) IV, oxyCODONE, oxyCODONE, senna-docusate, sodium phosphate, traMADol Anti-infectives (From admission, onward)   Start     Dose/Rate Route Frequency Ordered Stop   01/29/19 1530  rifaximin (XIFAXAN) tablet 550 mg     550 mg Oral 2 times daily 01/29/19 1524     01/28/19 2200  ceFAZolin (ANCEF) IVPB 2g/100 mL premix  Status:  Discontinued     2 g 200 mL/hr over 30 Minutes Intravenous Every 6 hours 01/28/19 1845 01/29/19 1535   01/28/19 1400  ceFAZolin (ANCEF) IVPB 2g/100 mL premix  Status:  Discontinued     2 g 200 mL/hr over 30 Minutes Intravenous  Once 01/28/19 0040 01/28/19 1845   01/28/19 1000  fluconazole (DIFLUCAN) tablet 100 mg  Status:  Discontinued     100 mg Oral Daily 01/28/19 0452 01/28/19 1845     Scheduled  Meds:  sodium chloride   Intravenous Once   budesonide  9 mg Oral Daily   [START ON 01/31/2019] cyanocobalamin  1,000 mcg Subcutaneous Once   dicyclomine  20 mg Oral TID AC   enoxaparin (LOVENOX) injection  40 mg Subcutaneous Q24H   feeding supplement (PRO-STAT SUGAR FREE 64)  30 mL Oral BID   FLUoxetine  80 mg Oral Daily   mirtazapine  7.5 mg Oral QHS   mometasone-formoterol  2 puff Inhalation BID   multivitamin with minerals  1 tablet Oral Daily   pantoprazole  40 mg Oral BID   pravastatin  10 mg Oral Daily   rifaximin  550 mg Oral BID   rOPINIRole  0.5 mg Oral QHS   Continuous Infusions:  iron sucrose 400 mg (01/30/19 1752)   methocarbamol (ROBAXIN) IV     PRN Meds:.bisacodyl, cyclobenzaprine, HYDROmorphone (DILAUDID) injection, methocarbamol **OR** methocarbamol (ROBAXIN) IV, metoCLOPramide **OR** metoCLOPramide (REGLAN) injection, ondansetron **OR** ondansetron (ZOFRAN) IV, oxyCODONE, oxyCODONE, senna-docusate, sodium phosphate, traMADol   Assessment: Active Problems:   Closed intertrochanteric fracture of hip, right, initial encounter (HCC)  Small bowel  Crohn's, CT enterography revealed multiple moderately dilated loops distal jejunum and ileum throughout the abdomen with intervening strictured segments of appearing length, longest stricture measuring proximally 6 cm in length (series 3/image 172). There is mild wall thickening and mild mucosal hyperenhancement at the areas of stricturing without significant surrounding inflammatory fat stranding or mesenteric edema. The terminal ileum is relatively collapsed and demonstrates mild mucosal hyperenhancement and fatty mural replacement.  Plan: Small bowel Crohn's, inflammatory and fibrous stenotic Discussed with her about surgical management of fibro-stenotic disease and medical management of inflammatory disease.  This can be addressed as outpatient with close follow-up in my office Continue budesonide 3 mg 3 capsules daily as outpatient Continue rifaximin 550 mg twice daily as outpatient Recommend prescription for weekly B12 injections for 4 weeks upon discharge Continue to hold azathioprine even as outpatient Labs are in process Recommend to avoid NSAID use Please arrange a follow-up with me in 2 to 3 weeks after discharge No further inpatient work-up is needed at this time with regards to her Crohn's disease  Osteoporosis Recommend referral to endocrinology due to severe osteoporosis resulting in hip fracture Vitamin D levels pending Recommend calcium and vitamin D supplements daily as outpatient  GI will sign off at this time.  Please call GI back with questions or concerns   LOS: 2 days   Lum Stillinger 01/30/2019, 7:08 PM

## 2019-01-30 NOTE — Progress Notes (Signed)
  Subjective: 2 Days Post-Op Procedure(s) (LRB): INTRAMEDULLARY (IM) NAIL INTERTROCHANTRIC (Right) Patient reports pain as moderate.   Patient seen in rounds with Dr. Posey Pronto. Patient is well, and has had no acute complaints or problems Plan is to go Rehab after hospital stay. Negative for chest pain and shortness of breath Fever: 99.2, low-grade. Gastrointestinal: Negative for nausea and vomiting.  Loose bowels.  GI consult.  Objective: Vital signs in last 24 hours: Temp:  [99.2 F (37.3 C)-99.5 F (37.5 C)] 99.2 F (37.3 C) (03/13 2323) Pulse Rate:  [84-85] 84 (03/13 2323) Resp:  [20] 20 (03/13 2323) BP: (99-100)/(58-79) 100/79 (03/13 2323) SpO2:  [97 %-98 %] 97 % (03/13 2323)  Intake/Output from previous day:  Intake/Output Summary (Last 24 hours) at 01/30/2019 0747 Last data filed at 01/29/2019 2106 Gross per 24 hour  Intake 120 ml  Output 4 ml  Net 116 ml    Intake/Output this shift: No intake/output data recorded.  Labs: Recent Labs    01/27/19 2247 01/29/19 0253 01/30/19 0407  HGB 9.2* 7.4* 6.7*   Recent Labs    01/29/19 0253 01/30/19 0407  WBC 2.4* 2.6*  RBC 1.94* 1.73*  HCT 23.3* 20.9*  PLT 231 194   Recent Labs    01/29/19 0253 01/30/19 0407  NA 140 142  K 3.7 3.7  CL 105 105  CO2 24 30  BUN 10 9  CREATININE 0.97 0.83  GLUCOSE 199* 103*  CALCIUM 7.4* 7.7*   Recent Labs    01/27/19 2247  INR 1.0     EXAM General - Patient is Alert and Oriented Extremity - Neurovascular intact Sensation intact distally Compartment soft Dressing/Incision - clean, dry, no drainage Motor Function - intact, moving foot and toes well on exam.  Ambulated 2 feet with physical therapy.  Past Medical History:  Diagnosis Date  . Anemia    past hx  . Anxiety   . Arthritis    osteo - shoulders, hips, knees  . Complication of anesthesia    propofol causes headaches  . Coronary artery disease   . Crohn's disease ([place])   . Depression   . GERD  (gastroesophageal reflux disease)   . History of chicken pox   . History of measles   . History of mumps   . Hypertension   . 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 Wears dentures    full upper    Assessment/Plan: 2 Days Post-Op Procedure(s) (LRB): INTRAMEDULLARY (IM) NAIL INTERTROCHANTRIC (Right) Active Problems:   Closed intertrochanteric fracture of hip, right, initial encounter ([place])  Estimated body mass index is 30.54 kg/m as calculated from the following:   Height as of this encounter: 5' 7"  (1.702 m).   Weight as of this encounter: 88.5 kg. Advance diet Up with therapy D/C IV fluids Discharge to SNF when cleared by medicine Hemoglobin 6.7.  1 unit packed red blood cells transfusion ordered by medicine. Gastroenterology consult.  Appreciate assistance.  DVT Prophylaxis - Lovenox, Foot Pumps and TED hose Toe-touch weight-Bearing to right leg  Reche Dixon, PA-C Orthopaedic Surgery 01/30/2019, 7:47 AM

## 2019-01-30 NOTE — Progress Notes (Signed)
Patient ID: Kimberly Hudson, female   DOB: Mar 26, 1956, 63 y.o.   MRN: [id number]  [facility] PROGRESS NOTE  Kimberly Hudson YSH:[id number] DOB: Nov 14, 1956 DOA: 01/27/2019 PCP: Birdie Sons, MD  HPI/Subjective: Patient feeling okay.  Having some joint pain.  Had diarrhea after the CAT scan.  Objective: Vitals:   01/30/19 1123 01/30/19 1203  BP: 108/65 (!) 98/58  Pulse: 85 93  Resp: 19 18  Temp: 98.8 F (37.1 C) 99.1 F (37.3 C)  SpO2: 100% 93%    Filed Weights   01/27/19 2240  Weight: 88.5 kg    ROS: Review of Systems  Constitutional: Negative for chills and fever.  Eyes: Negative for blurred vision.  Respiratory: Negative for cough and shortness of breath.   Cardiovascular: Negative for chest pain.  Gastrointestinal: Positive for diarrhea. Negative for abdominal pain, constipation, nausea and vomiting.  Genitourinary: Negative for dysuria.  Musculoskeletal: Positive for joint pain.  Neurological: Negative for dizziness and headaches.   Exam: Physical Exam  Constitutional: She is oriented to person, place, and time.  HENT:  Nose: No mucosal edema.  Mouth/Throat: No oropharyngeal exudate or posterior oropharyngeal edema.  Eyes: Pupils are equal, round, and reactive to light. Conjunctivae, EOM and lids are normal.  Neck: No JVD present. Carotid bruit is not present. No edema present. No thyroid mass and no thyromegaly present.  Cardiovascular: S1 normal and S2 normal. Exam reveals no gallop.  No murmur heard. Pulses:      Dorsalis pedis pulses are 2+ on the right side and 2+ on the left side.  Respiratory: No respiratory distress. She has decreased breath sounds in the right lower field and the left lower field. She has no wheezes. She has no rhonchi. She has no rales.  GI: Soft. Bowel sounds are normal. There is no abdominal tenderness.  Musculoskeletal:     Right ankle: She exhibits no swelling.     Left ankle: She exhibits no swelling.  Lymphadenopathy:     She has no cervical adenopathy.  Neurological: She is alert and oriented to person, place, and time. No cranial nerve deficit.  Skin: Skin is warm. No rash noted. Nails show no clubbing.  Psychiatric: She has a normal mood and affect.      Data Reviewed: Basic Metabolic Panel: Recent Labs  Lab 01/27/19 2247 01/28/19 0031 01/28/19 0847 01/29/19 0253 01/30/19 0407  NA 137  --  137 140 142  K <2.0*  --  3.2* 3.7 3.7  CL 96*  --  99 105 105  CO2 27  --  29 24 30   GLUCOSE 122*  --  117* 199* 103*  BUN 9  --  10 10 9   CREATININE 0.92  --  0.97 0.97 0.83  CALCIUM 7.6*  --  7.9* 7.4* 7.7*  MG  --  1.6*  --   --  2.1  PHOS  --  2.2*  --   --  4.0   Liver Function Tests: Recent Labs  Lab 01/27/19 2247  AST 48*  ALT 18  ALKPHOS 154*  BILITOT 1.0  PROT 5.8*  ALBUMIN 2.3*    CBC: Recent Labs  Lab 01/27/19 2247 01/29/19 0253 01/30/19 0407  WBC 3.4* 2.4* 2.6*  NEUTROABS 2.5  --   --   HGB 9.2* 7.4* 6.7*  HCT 26.9* 23.3* 20.9*  MCV 113.0* 120.1* 120.8*  PLT 290 231 194   \  Studies: Ct Entero Abd/pelvis W Contast  Result Date: 01/29/2019 CLINICAL DATA:  Inpatient. Episodes of nausea, bloating and diarrhea. Reported history of poorly controlled Crohn disease. Recent right hip fracture. EXAM: CT ABDOMEN AND PELVIS WITH CONTRAST (ENTEROGRAPHY) TECHNIQUE: Multidetector CT of the abdomen and pelvis during bolus administration of intravenous contrast. Negative oral contrast was given. CONTRAST:  132m OMNIPAQUE IOHEXOL 300 MG/ML  SOLN COMPARISON:  01/27/2019 pelvic radiograph. FINDINGS: Lower chest: Minimal patchy tree-in-bud opacity in the right middle lobe. Hepatobiliary: Normal liver size. Granulomatous posterior right liver lobe calcification. No liver mass. Cholelithiasis. No gallbladder distention. No definite gallbladder wall thickening. No pericholecystic fluid. No biliary ductal dilatation. Pancreas: Normal, with no mass or duct dilation. Spleen: Normal size. No mass.  Adrenals/Urinary Tract: Right adrenal 1.9 cm nodule with density 24 HU. Left adrenal 2.4 cm nodule with density 40 HU. Normal kidneys with no hydronephrosis and no renal mass. Tiny foci of gas in the nondependent bladder lumen, presumably due to recent instrumentation. Otherwise normal bladder. Stomach/Bowel: Normal stomach. There are multiple moderately dilated loops distal jejunum and ileum throughout the abdomen with intervening strictured segments of appearing length, longest stricture measuring proximally 6 cm in length (series 3/image 172). There is mild wall thickening and mild mucosal hyperenhancement at the areas of stricturing without significant surrounding inflammatory fat stranding or mesenteric edema. The terminal ileum is relatively collapsed and demonstrates mild mucosal hyperenhancement and fatty mural replacement. Normal appendix. Relatively collapsed large bowel, with no large bowel wall thickening, diverticulosis or pericolonic fat stranding. No discrete bowel fistula. Vascular/Lymphatic: Atherosclerotic nonaneurysmal abdominal aorta. Patent portal, splenic, hepatic and renal veins. No pathologically enlarged lymph nodes in the abdomen or pelvis. Reproductive: Grossly normal uterus.  No adnexal mass. Other: No pneumoperitoneum, ascites or focal fluid collection. Musculoskeletal: No aggressive appearing focal osseous lesions. Partially visualized expected postsurgical changes from ORIF of the comminuted intertrochanteric proximal right femur fracture, which is in near-anatomic alignment. Mild thoracolumbar spondylosis. IMPRESSION: 1. CT enterography findings are compatible with chronic fibrostenotic disease in the mid to distal small bowel with multiple strictures and intervening dilated small bowel loops. No bowel fistula or abscess. 2. Indeterminate bilateral adrenal nodules, presumably representing adenomas assuming no history of malignancy. Suggests adrenal protocol CT abdomen without and with  IV contrast in 12 months. This recommendation follows ACR consensus guidelines: Management of Incidental Adrenal Masses: A White Paper of the ACR Incidental Findings Committee. J Am Coll Radiol 2017;14:1038-1044. 3. Minimal patchy tree-in-bud opacity in the right middle lobe compatible with minimal bronchiolitis of uncertain clinical significance. 4. Cholelithiasis. 5. Postsurgical changes from ORIF intertrochanteric right femur fracture. 6. Tiny foci of gas in the nondependent bladder lumen, presumably due to recent bladder instrumentation. 7.  Aortic Atherosclerosis (ICD10-I70.0). Electronically Signed   By: JIlona SorrelM.D.   On: 01/29/2019 19:21   Dg Hip Operative Unilat W Or W/o Pelvis Right  Result Date: 01/28/2019 CLINICAL DATA:  Intraoperative imaging for ORIF of a proximal right femur fracture. EXAM: OPERATIVE RIGHT HIP (WITH PELVIS IF PERFORMED) 9 VIEWS TECHNIQUE: Fluoroscopic spot image(s) were submitted for interpretation post-operatively. COMPARISON:  01/27/2019 FINDINGS: Images show placement of a long intramedullary rod supporting a compression screw, reducing the primary fracture components of the displaced, comminuted intertrochanteric fracture into near anatomic alignment. The orthopedic hardware appears well seated. IMPRESSION: Well aligned proximal right femur fracture following ORIF. Electronically Signed   By: DLajean ManesM.D.   On: 01/28/2019 17:30    Scheduled Meds: . sodium chloride   Intravenous Once  . budesonide  9 mg Oral Daily  . dicyclomine  20 mg Oral TID AC  . enoxaparin (LOVENOX) injection  40 mg Subcutaneous Q24H  . feeding supplement (PRO-STAT SUGAR FREE 64)  30 mL Oral BID  . FLUoxetine  80 mg Oral Daily  . mirtazapine  7.5 mg Oral QHS  . mometasone-formoterol  2 puff Inhalation BID  . multivitamin with minerals  1 tablet Oral Daily  . pantoprazole  40 mg Oral BID  . pravastatin  10 mg Oral Daily  . rifaximin  550 mg Oral BID  . rOPINIRole  0.5 mg Oral QHS    Continuous Infusions: . methocarbamol (ROBAXIN) IV      Assessment/Plan:  1. Right intertrochanteric hip fracture requiring operative repair. 2. Postoperative anemia and chronic anemia.  Transfuse 1 unit of packed red blood cells.  Transfuse IV Venofer. 3. Crohn's disease with chronic diarrhea.  CT scan shows strictures. 4. Hypokalemia hypophosphatemia and hypomagnesemia.  Electrolytes replaced. 5. Leukopenia. 6. Hyperlipidemia on pravastatin  Code Status:     Code Status Orders  (From admission, onward)         Start     Ordered   01/28/19 0452  Full code  Continuous     01/28/19 0451        Code Status History    This patient has a current code status but no historical code status.     Disposition Plan: Likely discharge to rehab tomorrow  Consultants:  Orthopedic surgery   Procedures:  ORIF right femur  Time spent: 27 minutes  [place]

## 2019-01-30 NOTE — Progress Notes (Signed)
PT Cancellation Note  Patient Details Name: Kimberly Hudson MRN: [id number] DOB: 02/25/1956   Cancelled Treatment:    Reason Eval/Treat Not Completed: Fatigue/lethargy limiting ability to participate;Medical issues which prohibited therapy.  Pt was attempted but with impending transfusion and pt feeling too tired has deferred to later today.   Ramond Dial 01/30/2019, 10:26 AM  Mee Hives, PT MS Acute Rehab Dept. Number: [place] [facility] [place]

## 2019-01-30 NOTE — Progress Notes (Signed)
Pt began vomiting and having loose to solid bowel movements. Pt is having pain but also believes this was caused by a contrast she drank earlier for CT. Pt is feeling better after Zofran and Dilaudid administration. Will continue to monitor.

## 2019-01-31 LAB — TYPE AND SCREEN
ABO/RH(D): O POS
ANTIBODY SCREEN: NEGATIVE
Unit division: 0

## 2019-01-31 LAB — CBC
HCT: 23.3 % — ABNORMAL LOW (ref 36.0–46.0)
Hemoglobin: 7.7 g/dL — ABNORMAL LOW (ref 12.0–15.0)
MCH: 37.2 pg — ABNORMAL HIGH (ref 26.0–34.0)
MCHC: 33 g/dL (ref 30.0–36.0)
MCV: 112.6 fL — ABNORMAL HIGH (ref 80.0–100.0)
Platelets: 182 10*3/uL (ref 150–400)
RBC: 2.07 MIL/uL — ABNORMAL LOW (ref 3.87–5.11)
RDW: 24.3 % — ABNORMAL HIGH (ref 11.5–15.5)
WBC: 3 10*3/uL — ABNORMAL LOW (ref 4.0–10.5)
nRBC: 2 % — ABNORMAL HIGH (ref 0.0–0.2)

## 2019-01-31 LAB — BASIC METABOLIC PANEL
Anion gap: 7 (ref 5–15)
BUN: 9 mg/dL (ref 8–23)
CO2: 30 mmol/L (ref 22–32)
Calcium: 7.5 mg/dL — ABNORMAL LOW (ref 8.9–10.3)
Chloride: 104 mmol/L (ref 98–111)
Creatinine, Ser: 0.69 mg/dL (ref 0.44–1.00)
GFR calc Af Amer: >60 mL/min (ref >60)
GFR calc non Af Amer: >60 mL/min (ref >60)
GLUCOSE: 88 mg/dL (ref 70–99)
Potassium: 2.9 mmol/L — ABNORMAL LOW (ref 3.5–5.1)
Sodium: 141 mmol/L (ref 135–145)

## 2019-01-31 LAB — BPAM RBC
Blood Product Expiration Date: 202004062359
ISSUE DATE / TIME: 202003141135
Unit Type and Rh: 5100

## 2019-01-31 LAB — POTASSIUM: POTASSIUM: 3.7 mmol/L (ref 3.5–5.1)

## 2019-01-31 MED ORDER — CYANOCOBALAMIN 1000 MCG/ML IJ SOLN
1000.0000 ug | Freq: Every day | INTRAMUSCULAR | Status: DC
  Administered 2019-02-01 – 2019-02-06 (×6): 1000 ug via INTRAMUSCULAR
  Filled 2019-01-31 (×6): qty 1

## 2019-01-31 MED ORDER — TRAMADOL HCL 50 MG PO TABS: 50.0000 mg | ORAL_TABLET | Freq: Four times a day (QID) | ORAL | 1 refills | Status: AC | PRN

## 2019-01-31 MED ORDER — OXYCODONE HCL 5 MG PO TABS: 5.0000 mg | ORAL_TABLET | ORAL | 0 refills | Status: DC | PRN

## 2019-01-31 MED ORDER — POTASSIUM CHLORIDE 10 MEQ/100ML IV SOLN
10.0000 meq | INTRAVENOUS | Status: AC
  Administered 2019-01-31: 10 meq via INTRAVENOUS
  Filled 2019-01-31: qty 100

## 2019-01-31 MED ORDER — ENOXAPARIN SODIUM 40 MG/0.4ML ~~LOC~~ SOLN: 40.0000 mg | SUBCUTANEOUS | 0 refills | Status: AC

## 2019-01-31 MED ORDER — POTASSIUM CHLORIDE 20 MEQ PO PACK
40.0000 meq | PACK | Freq: Three times a day (TID) | ORAL | Status: DC
  Administered 2019-01-31 – 2019-02-01 (×2): 40 meq via ORAL
  Filled 2019-01-31 (×2): qty 2

## 2019-01-31 MED ORDER — POTASSIUM CHLORIDE 10 MEQ/100ML IV SOLN
10.0000 meq | Freq: Once | INTRAVENOUS | Status: AC
  Administered 2019-01-31: 10 meq via INTRAVENOUS
  Filled 2019-01-31: qty 100

## 2019-01-31 MED ORDER — POTASSIUM CHLORIDE CRYS ER 20 MEQ PO TBCR
40.0000 meq | EXTENDED_RELEASE_TABLET | Freq: Three times a day (TID) | ORAL | Status: DC
  Administered 2019-01-31 (×2): 40 meq via ORAL
  Filled 2019-01-31 (×3): qty 2

## 2019-01-31 NOTE — TOC Progression Note (Signed)
Transition of Care [facility]) - Progression Note    Patient Details  Name: Kimberly Hudson MRN: [id number] Date of Birth: 04/25/1956  [facility] CM/SW [place], [place] Phone Number: 01/31/2019, 4:59 PM  Clinical Narrative:     The CSW met with the patient at bedside to provide bed offers for transition to SNF. The patient chose [facility]. The CSW updated Otila Kluver at [facility] who has initiated authorization through [place]. The proposed date of dc is 02/01/2019 pending insurance authorization. TOC will continue to follow.   Expected Discharge Plan: Skilled Nursing Facility Barriers to Discharge: SNF Pending discharge orders, SNF Pending discharge summary, SNF Pending bed offer, Insurance Authorization  Expected Discharge Plan and Services Expected Discharge Plan: [place] Discharge Planning Services: CM Consult Post Acute Care Choice: [place] arrangements for the past 2 months: Group Home, Mobile Home(has 6 steps into home)                           Social Determinants of Health (SDOH) Interventions    Readmission Risk Interventions 30 Day Unplanned Readmission Risk Score     ED to Hosp-Admission (Current) from 01/27/2019 in [place] (1A)  30 Day Unplanned Readmission Risk Score (%)  22 Filed at 01/31/2019 1600     This score is the patient's risk of an unplanned readmission within 30 days of being discharged (0 -100%). The score is based on dignosis, age, lab data, medications, orders, and past utilization.   Low:  0-14.9   Medium: 15-21.9   High: 22-29.9   Extreme: 30 and above       No flowsheet data found.

## 2019-01-31 NOTE — Progress Notes (Signed)
  Subjective: 3 Days Post-Op Procedure(s) (LRB): INTRAMEDULLARY (IM) NAIL INTERTROCHANTRIC (Right) Patient reports pain as moderate.   Patient seen in rounds with Dr. Posey Pronto. Patient is well, and has had no acute complaints or problems Plan is to go Rehab after hospital stay. Negative for chest pain and shortness of breath Fever: No fever. Gastrointestinal: Negative for nausea and vomiting.     Objective: Vital signs in last 24 hours: Temp:  [98.1 F (36.7 C)-99.1 F (37.3 C)] 98.3 F (36.8 C) (03/15 0115) Pulse Rate:  [78-93] 81 (03/15 0115) Resp:  [16-19] 18 (03/15 0115) BP: (93-117)/(35-68) 105/66 (03/15 0115) SpO2:  [93 %-100 %] 95 % (03/15 0115)  Intake/Output from previous day:  Intake/Output Summary (Last 24 hours) at 01/31/2019 0741 Last data filed at 01/30/2019 1752 Gross per 24 hour  Intake 1076 ml  Output 130 ml  Net 946 ml    Intake/Output this shift: No intake/output data recorded.  Labs: Recent Labs    01/29/19 0253 01/30/19 0407 01/31/19 0510  HGB 7.4* 6.7* 7.7*   Recent Labs    01/30/19 0407 01/31/19 0510  WBC 2.6* 3.0*  RBC 1.73* 2.07*  HCT 20.9* 23.3*  PLT 194 182   Recent Labs    01/30/19 0407 01/31/19 0510  NA 142 141  K 3.7 2.9*  CL 105 104  CO2 30 30  BUN 9 9  CREATININE 0.83 0.69  GLUCOSE 103* 88  CALCIUM 7.7* 7.5*   No results for input(s): LABPT, INR in the last 72 hours.   EXAM General - Patient is Alert and Oriented Extremity - Neurovascular intact Sensation intact distally Compartment soft Dressing/Incision - clean, dry, scant drainage involving the most proximal wound.   Motor Function - intact, moving foot and toes well on exam.  Ambulated 2 feet with physical therapy.  Past Medical History:  Diagnosis Date  . Anemia    past hx  . Anxiety   . Arthritis    osteo - shoulders, hips, knees  . Complication of anesthesia    propofol causes headaches  . Coronary artery disease   . Crohn's disease ([place])   .  Depression   . GERD (gastroesophageal reflux disease)   . History of chicken pox   . History of measles   . History of mumps   . Hypertension   . 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 Wears dentures    full upper    Assessment/Plan: 3 Days Post-Op Procedure(s) (LRB): INTRAMEDULLARY (IM) NAIL INTERTROCHANTRIC (Right) Active Problems:   Closed intertrochanteric fracture of hip, right, initial encounter ([place])  Estimated body mass index is 30.54 kg/m as calculated from the following:   Height as of this encounter: 5' 7"  (1.702 m).   Weight as of this encounter: 88.5 kg. Advance diet Up with therapy D/C IV fluids Discharge to SNF when cleared by medicine  Hemoglobin 7.7.  Received 1 unit yesterday. Potassium 2.9. Gastroenterology consult.  Appreciate assistance.  DVT Prophylaxis - Lovenox, Foot Pumps and TED hose Toe-touch weight-Bearing to right leg  Reche Dixon, PA-C Orthopaedic Surgery 01/31/2019, 7:41 AM

## 2019-01-31 NOTE — Progress Notes (Signed)
Patient ID: Kimberly Hudson, female   DOB: [birth year], 63 y.o.   MRN: [id number]  [facility] PROGRESS NOTE  Kimberly Hudson GYF:[id number] DOB: August 24, 1956 DOA: 01/27/2019 PCP: Birdie Sons, MD  HPI/Subjective: Patient having some numbness and tingling down her legs that is been going on for a while.  Potassium is low this morning.  Still having pain in the right hip area  Objective: Vitals:   01/31/19 0115 01/31/19 0811  BP: 105/66 127/73  Pulse: 81 85  Resp: 18 19  Temp: 98.3 F (36.8 C) 98.5 F (36.9 C)  SpO2: 95% 100%    Filed Weights   01/27/19 2240  Weight: 88.5 kg    ROS: Review of Systems  Constitutional: Negative for chills and fever.  Eyes: Negative for blurred vision.  Respiratory: Negative for cough and shortness of breath.   Cardiovascular: Negative for chest pain.  Gastrointestinal: Positive for diarrhea. Negative for abdominal pain, constipation, nausea and vomiting.  Genitourinary: Negative for dysuria.  Musculoskeletal: Positive for joint pain.  Neurological: Negative for dizziness and headaches.   Exam: Physical Exam  Constitutional: She is oriented to person, place, and time.  HENT:  Nose: No mucosal edema.  Mouth/Throat: No oropharyngeal exudate or posterior oropharyngeal edema.  Eyes: Pupils are equal, round, and reactive to light. Conjunctivae, EOM and lids are normal.  Neck: No JVD present. Carotid bruit is not present. No edema present. No thyroid mass and no thyromegaly present.  Cardiovascular: S1 normal and S2 normal. Exam reveals no gallop.  No murmur heard. Pulses:      Dorsalis pedis pulses are 2+ on the right side and 2+ on the left side.  Respiratory: No respiratory distress. She has decreased breath sounds in the right lower field and the left lower field. She has no wheezes. She has no rhonchi. She has no rales.  GI: Soft. Bowel sounds are normal. There is no abdominal tenderness.  Musculoskeletal:     Right ankle: She exhibits  no swelling.     Left ankle: She exhibits no swelling.  Lymphadenopathy:    She has no cervical adenopathy.  Neurological: She is alert and oriented to person, place, and time. No cranial nerve deficit.  Skin: Skin is warm. No rash noted. Nails show no clubbing.  Psychiatric: She has a normal mood and affect.      Data Reviewed: Basic Metabolic Panel: Recent Labs  Lab 01/27/19 2247 01/28/19 0031 01/28/19 0847 01/29/19 0253 01/30/19 0407 01/31/19 0510  NA 137  --  137 140 142 141  K <2.0*  --  3.2* 3.7 3.7 2.9*  CL 96*  --  99 105 105 104  CO2 27  --  29 24 30 30   GLUCOSE 122*  --  117* 199* 103* 88  BUN 9  --  10 10 9 9   CREATININE 0.92  --  0.97 0.97 0.83 0.69  CALCIUM 7.6*  --  7.9* 7.4* 7.7* 7.5*  MG  --  1.6*  --   --  2.1  --   PHOS  --  2.2*  --   --  4.0  --    Liver Function Tests: Recent Labs  Lab 01/27/19 2247  AST 48*  ALT 18  ALKPHOS 154*  BILITOT 1.0  PROT 5.8*  ALBUMIN 2.3*    CBC: Recent Labs  Lab 01/27/19 2247 01/29/19 0253 01/30/19 0407 01/31/19 0510  WBC 3.4* 2.4* 2.6* 3.0*  NEUTROABS 2.5  --   --   --  HGB 9.2* 7.4* 6.7* 7.7*  HCT 26.9* 23.3* 20.9* 23.3*  MCV 113.0* 120.1* 120.8* 112.6*  PLT 290 231 194 182   \  Studies: Ct Entero Abd/pelvis W Contast  Result Date: 01/29/2019 CLINICAL DATA:  Inpatient. Episodes of nausea, bloating and diarrhea. Reported history of poorly controlled Crohn disease. Recent right hip fracture. EXAM: CT ABDOMEN AND PELVIS WITH CONTRAST (ENTEROGRAPHY) TECHNIQUE: Multidetector CT of the abdomen and pelvis during bolus administration of intravenous contrast. Negative oral contrast was given. CONTRAST:  171m OMNIPAQUE IOHEXOL 300 MG/ML  SOLN COMPARISON:  01/27/2019 pelvic radiograph. FINDINGS: Lower chest: Minimal patchy tree-in-bud opacity in the right middle lobe. Hepatobiliary: Normal liver size. Granulomatous posterior right liver lobe calcification. No liver mass. Cholelithiasis. No gallbladder  distention. No definite gallbladder wall thickening. No pericholecystic fluid. No biliary ductal dilatation. Pancreas: Normal, with no mass or duct dilation. Spleen: Normal size. No mass. Adrenals/Urinary Tract: Right adrenal 1.9 cm nodule with density 24 HU. Left adrenal 2.4 cm nodule with density 40 HU. Normal kidneys with no hydronephrosis and no renal mass. Tiny foci of gas in the nondependent bladder lumen, presumably due to recent instrumentation. Otherwise normal bladder. Stomach/Bowel: Normal stomach. There are multiple moderately dilated loops distal jejunum and ileum throughout the abdomen with intervening strictured segments of appearing length, longest stricture measuring proximally 6 cm in length (series 3/image 172). There is mild wall thickening and mild mucosal hyperenhancement at the areas of stricturing without significant surrounding inflammatory fat stranding or mesenteric edema. The terminal ileum is relatively collapsed and demonstrates mild mucosal hyperenhancement and fatty mural replacement. Normal appendix. Relatively collapsed large bowel, with no large bowel wall thickening, diverticulosis or pericolonic fat stranding. No discrete bowel fistula. Vascular/Lymphatic: Atherosclerotic nonaneurysmal abdominal aorta. Patent portal, splenic, hepatic and renal veins. No pathologically enlarged lymph nodes in the abdomen or pelvis. Reproductive: Grossly normal uterus.  No adnexal mass. Other: No pneumoperitoneum, ascites or focal fluid collection. Musculoskeletal: No aggressive appearing focal osseous lesions. Partially visualized expected postsurgical changes from ORIF of the comminuted intertrochanteric proximal right femur fracture, which is in near-anatomic alignment. Mild thoracolumbar spondylosis. IMPRESSION: 1. CT enterography findings are compatible with chronic fibrostenotic disease in the mid to distal small bowel with multiple strictures and intervening dilated small bowel loops. No  bowel fistula or abscess. 2. Indeterminate bilateral adrenal nodules, presumably representing adenomas assuming no history of malignancy. Suggests adrenal protocol CT abdomen without and with IV contrast in 12 months. This recommendation follows ACR consensus guidelines: Management of Incidental Adrenal Masses: A White Paper of the ACR Incidental Findings Committee. J Am Coll Radiol 2017;14:1038-1044. 3. Minimal patchy tree-in-bud opacity in the right middle lobe compatible with minimal bronchiolitis of uncertain clinical significance. 4. Cholelithiasis. 5. Postsurgical changes from ORIF intertrochanteric right femur fracture. 6. Tiny foci of gas in the nondependent bladder lumen, presumably due to recent bladder instrumentation. 7.  Aortic Atherosclerosis (ICD10-I70.0). Electronically Signed   By: JIlona SorrelM.D.   On: 01/29/2019 19:21    Scheduled Meds: . sodium chloride   Intravenous Once  . budesonide  9 mg Oral Daily  . dicyclomine  20 mg Oral TID AC  . enoxaparin (LOVENOX) injection  40 mg Subcutaneous Q24H  . feeding supplement (PRO-STAT SUGAR FREE 64)  30 mL Oral BID  . FLUoxetine  80 mg Oral Daily  . mirtazapine  7.5 mg Oral QHS  . mometasone-formoterol  2 puff Inhalation BID  . multivitamin with minerals  1 tablet Oral Daily  . pantoprazole  40  mg Oral BID  . potassium chloride  40 mEq Oral TID  . pravastatin  10 mg Oral Daily  . rifaximin  550 mg Oral BID  . rOPINIRole  0.5 mg Oral QHS   Continuous Infusions: . methocarbamol (ROBAXIN) IV    . potassium chloride      Assessment/Plan:  1. Right intertrochanteric hip fracture requiring operative repair.  Potentially out to rehab tomorrow if insurance company approves. 2. Postoperative anemia and chronic anemia.  Patient given a unit of blood yesterday.  Hemoglobin up to 7.7 today.  Given IV Venofer yesterday.  B12 injection today 3. Crohn's disease with chronic diarrhea.  CT scan shows strictures. 4. Hypokalemia.  Give IV and  oral potassium today.  Recheck tomorrow 5. Hypophosphatemia and hypomagnesemia replaced 6. Leukopenia. 7. Hyperlipidemia on pravastatin 8. B12 deficiency status post injection today.  We will give another 1 tomorrow.  Code Status:     Code Status Orders  (From admission, onward)         Start     Ordered   01/28/19 0452  Full code  Continuous     01/28/19 0451        Code Status History    This patient has a current code status but no historical code status.     Disposition Plan: Likely discharge to rehab tomorrow  Consultants:  Orthopedic surgery  Gastroenterology   Procedures:  ORIF right femur  Time spent: 26 minutes  [place]

## 2019-01-31 NOTE — TOC Transition Note (Deleted)
Transition of Care (TOC) - CM/SW Discharge Note   Patient Details  Name: Consuella R Bettinger MRN: [id number] Date of Birth: 08/26/1956  Transition of Care (TOC) CM/SW Contact:   M , LCSW Phone Number: 01/31/2019, 4:58 PM   Clinical Narrative:   The CSW met with the patient at bedside to provide bed offers for transition to SNF. The patient chose [facility]. The CSW updated Tina at [facility] who has initiated authorization through [employer]. The proposed date of dc is 02/01/2019 pending insurance authorization. TOC will continue to follow.      Barriers to Discharge: SNF Pending discharge orders, SNF Pending discharge summary, SNF Pending bed offer, Insurance Authorization   Patient Goals and CMS Choice Patient states their goals for this hospitalization and ongoing recovery are:: go to rehab CMS Medicare.gov Compare Post Acute Care list provided to:: Patient Choice offered to / list presented to : Patient, Adult Children  Discharge Placement                       Discharge Plan and Services Discharge Planning Services: CM Consult Post Acute Care Choice: Home Health                    Social Determinants of Health (SDOH) Interventions     Readmission Risk Interventions No flowsheet data found.     

## 2019-01-31 NOTE — Progress Notes (Signed)
Physical Therapy Treatment Patient Details Name: Kimberly Hudson MRN: [id number] DOB: July 27, 1956 Today's Date: 01/31/2019    History of Present Illness Pt is a 63 yo female with a PMH that includes Crohn's disease, anxiety, CAD, depression, GERD, and HTN presented to the ED after a fall resulting in a R intertrochanteric hip fracture.  Pt is s/p intramedullary nailing of R femur with cephalomedullary device.  MD assessment also includes: weakness, chronic diarrhea, hypochloremia, hyperglycemia, hypocalcemia, transaminasemia, hypoproteinemia/hypoalbuminemia, leukopenia (w/o neutropenia), and macrocytic anemia.     PT Comments    Pt received in bed, agreeable to participate. Does well with assisted bed exercises, but still pain limited, needs VC for breathing. ModA to EOB, ModA up from elevated surface, but able to pivot at minGuard and turn backside to chair. Pt asks to leave feet on floor at end of session to allow for passive and active knee flexion for a short period. She is educated on how to elevated feet once desired. All needs met at end of session, pt up to chair, RN made aware.      Follow Up Recommendations  SNF     Equipment Recommendations       Recommendations for Other Services       Precautions / Restrictions Precautions Precautions: Fall Restrictions RLE Weight Bearing: Touchdown weight bearing Other Position/Activity Restrictions: Pt is TDWB with the foot to be placed flat on the floor with ambulation (FFWB) with the weight of the leg only    Mobility  Bed Mobility Overal bed mobility: Needs Assistance Bed Mobility: Supine to Sit     Supine to sit: Mod assist     General bed mobility comments: Pt offered a hand for pt to pull self forward, while PT assists with RLE to floor   Transfers Overall transfer level: Needs assistance Equipment used: Rolling walker (2 wheeled) Transfers: Sit to/from [employer] Transfers Sit to Stand: From elevated  surface;Mod assist Stand pivot transfers: Min guard(socks removed to allow easy movement of feet on floor )          Ambulation/Gait Ambulation/Gait assistance: (unable at this time, pain too high and BUE insufficicently strong)               Stairs             Wheelchair Mobility    Modified Rankin (Stroke Patients Only)       Balance Overall balance assessment: Needs assistance   Sitting balance-Leahy Scale: Good     Standing balance support: Bilateral upper extremity supported Standing balance-Leahy Scale: Fair                              Cognition Arousal/Alertness: Awake/alert Behavior During Therapy: WFL for tasks assessed/performed Overall Cognitive Status: Within Functional Limits for tasks assessed                                        Exercises General Exercises - Lower Extremity Gluteal Sets: Strengthening;Right;15 reps;Supine Short Arc Quad: AAROM;Supine;Right;10 reps(x10 A/ROM on Left) Heel Slides: AAROM;15 reps;Supine;Right Hip ABduction/ADduction: AAROM;Supine;15 reps    General Comments        Pertinent Vitals/Pain Pain Assessment: Faces Faces Pain Scale: Hurts even more Pain Location: Rt hip Pain Descriptors / Indicators: Aching;Sore Pain Intervention(s): Limited activity within patient's tolerance;Monitored during session;Premedicated before session;Repositioned  Home Living                      Prior Function            PT Goals (current goals can now be found in the care plan section) Acute Rehab PT Goals Patient Stated Goal: To walk without falling down PT Goal Formulation: With patient Time For Goal Achievement: 02/11/19 Potential to Achieve Goals: Good Progress towards PT goals: Progressing toward goals    Frequency    BID      PT Plan Current plan remains appropriate    Co-evaluation              AM-PAC PT "6 Clicks" Mobility   Outcome Measure  Help  needed turning from your back to your side while in a flat bed without using bedrails?: A Little Help needed moving from lying on your back to sitting on the side of a flat bed without using bedrails?: A Lot Help needed moving to and from a bed to a chair (including a wheelchair)?: A Lot Help needed standing up from a chair using your arms (e.g., wheelchair or bedside chair)?: A Lot Help needed to walk in hospital room?: Total Help needed climbing 3-5 steps with a railing? : Total 6 Click Score: 11    End of Session   Activity Tolerance: Patient limited by pain;Patient tolerated treatment well Patient left: in bed;with call bell/phone within reach;with bed alarm set;with SCD's reapplied Nurse Communication: Mobility status PT Visit Diagnosis: Unsteadiness on feet (R26.81);History of falling (Z91.81);Other abnormalities of gait and mobility (R26.89);Muscle weakness (generalized) (M62.81)     Time: 6950-7225 PT Time Calculation (min) (ACUTE ONLY): 33 min  Charges:  $Therapeutic Exercise: 23-37 mins                12:50 PM, 01/31/19 Etta Grandchild, PT, DPT Physical Therapist - [facility]  [phone] ([place])     Buccola,Allan C 01/31/2019, 12:48 PM

## 2019-02-01 ENCOUNTER — Encounter: Payer: Self-pay | Admitting: Orthopedic Surgery

## 2019-02-01 ENCOUNTER — Other Ambulatory Visit: Payer: Self-pay | Admitting: Gastroenterology

## 2019-02-01 DIAGNOSIS — M8000XS Age-related osteoporosis with current pathological fracture, unspecified site, sequela: Secondary | ICD-10-CM

## 2019-02-01 DIAGNOSIS — E559 Vitamin D deficiency, unspecified: Secondary | ICD-10-CM

## 2019-02-01 LAB — VITAMIN D 25 HYDROXY (VIT D DEFICIENCY, FRACTURES): Vit D, 25-Hydroxy: 5.6 ng/mL — ABNORMAL LOW (ref 30.0–100.0)

## 2019-02-01 LAB — CREATININE, SERUM
Creatinine, Ser: 0.79 mg/dL (ref 0.44–1.00)
GFR calc Af Amer: >60 mL/min (ref >60)
GFR calc non Af Amer: >60 mL/min (ref >60)

## 2019-02-01 LAB — HEPATITIS PANEL, ACUTE
HEP B S AG: NEGATIVE
Hep A IgM: NEGATIVE
Hep B C IgM: NEGATIVE

## 2019-02-01 LAB — HEMOGLOBIN: Hemoglobin: 7.8 g/dL — ABNORMAL LOW (ref 12.0–15.0)

## 2019-02-01 LAB — POTASSIUM: Potassium: 4.5 mmol/L (ref 3.5–5.1)

## 2019-02-01 MED ORDER — CLONAZEPAM 0.5 MG PO TABS: 0.5000 mg | ORAL_TABLET | Freq: Three times a day (TID) | ORAL | 0 refills | Status: DC | PRN

## 2019-02-01 MED ORDER — RIFAXIMIN 550 MG PO TABS: 550.0000 mg | ORAL_TABLET | Freq: Two times a day (BID) | ORAL | 0 refills | Status: AC

## 2019-02-01 MED ORDER — VITAMIN D (ERGOCALCIFEROL) 1.25 MG (50000 UNIT) PO CAPS: 50000.0000 [IU] | ORAL_CAPSULE | ORAL | 0 refills | Status: AC

## 2019-02-01 MED ORDER — VITAMIN D (ERGOCALCIFEROL) 1.25 MG (50000 UNIT) PO CAPS
50000.0000 [IU] | ORAL_CAPSULE | ORAL | Status: DC
  Administered 2019-02-01 – 2019-02-04 (×2): 50000 [IU] via ORAL
  Filled 2019-02-01 (×2): qty 1

## 2019-02-01 MED ORDER — POTASSIUM CHLORIDE CRYS ER 10 MEQ PO TBCR
10.0000 meq | EXTENDED_RELEASE_TABLET | Freq: Every day | ORAL | Status: DC
  Administered 2019-02-02 – 2019-02-06 (×5): 10 meq via ORAL
  Filled 2019-02-01 (×5): qty 1

## 2019-02-01 MED ORDER — CYANOCOBALAMIN 1000 MCG/ML IJ SOLN: 1000.0000 ug | INTRAMUSCULAR | 0 refills | Status: DC

## 2019-02-01 MED ORDER — PRO-STAT SUGAR FREE PO LIQD: 30.0000 mL | Freq: Two times a day (BID) | ORAL | 0 refills | Status: AC

## 2019-02-01 MED ORDER — FLUOXETINE HCL 40 MG PO CAPS: 80.0000 mg | ORAL_CAPSULE | Freq: Every day | ORAL | Status: DC

## 2019-02-01 MED ORDER — ADULT MULTIVITAMIN W/MINERALS CH: 1.0000 | ORAL_TABLET | Freq: Every day | ORAL | 0 refills | Status: AC

## 2019-02-01 MED ORDER — BUDESONIDE 3 MG PO CPEP: 9.0000 mg | ORAL_CAPSULE | Freq: Every day | ORAL | 0 refills | Status: DC

## 2019-02-01 MED ORDER — POTASSIUM CHLORIDE CRYS ER 10 MEQ PO TBCR: 10.0000 meq | EXTENDED_RELEASE_TABLET | Freq: Every day | ORAL | 0 refills | Status: DC

## 2019-02-01 NOTE — Progress Notes (Signed)
  Subjective: 4 Days Post-Op Procedure(s) (LRB): INTRAMEDULLARY (IM) NAIL INTERTROCHANTRIC (Right) Patient reports pain as moderate.   Patient seen in rounds with Dr. Posey Pronto. Patient is well, and has had no acute complaints or problems Plan is to go Rehab after hospital stay. Negative for chest pain and shortness of breath Fever: No fever. Gastrointestinal: Negative for nausea and vomiting.     Objective: Vital signs in last 24 hours: Temp:  [98 F (36.7 C)-99.3 F (37.4 C)] 98 F (36.7 C) (03/15 2320) Pulse Rate:  [77-85] 79 (03/15 2320) Resp:  [16-19] 19 (03/15 2320) BP: (96-127)/(43-73) 96/58 (03/15 2320) SpO2:  [94 %-100 %] 94 % (03/15 2320)  Intake/Output from previous day:  Intake/Output Summary (Last 24 hours) at 02/01/2019 0717 Last data filed at 01/31/2019 1600 Gross per 24 hour  Intake 80.64 ml  Output -  Net 80.64 ml    Intake/Output this shift: No intake/output data recorded.  Labs: Recent Labs    01/30/19 0407 01/31/19 0510  HGB 6.7* 7.7*   Recent Labs    01/30/19 0407 01/31/19 0510  WBC 2.6* 3.0*  RBC 1.73* 2.07*  HCT 20.9* 23.3*  PLT 194 182   Recent Labs    01/30/19 0407 01/31/19 0510 01/31/19 1937 02/01/19 0306  NA 142 141  --   --   K 3.7 2.9* 3.7  --   CL 105 104  --   --   CO2 30 30  --   --   BUN 9 9  --   --   CREATININE 0.83 0.69  --  0.79  GLUCOSE 103* 88  --   --   CALCIUM 7.7* 7.5*  --   --    No results for input(s): LABPT, INR in the last 72 hours.   EXAM General - Patient is Alert and Oriented Extremity - Neurovascular intact Sensation intact distally Compartment soft Dressing/Incision - clean, dry, scant drainage involving the most proximal wound.   Motor Function - intact, moving foot and toes well on exam.  Ambulated 2 feet with physical therapy.  Past Medical History:  Diagnosis Date  . Anemia    past hx  . Anxiety   . Arthritis    osteo - shoulders, hips, knees  . Complication of anesthesia    propofol  causes headaches  . Coronary artery disease   . Crohn's disease ([place])   . Depression   . GERD (gastroesophageal reflux disease)   . History of chicken pox   . History of measles   . History of mumps   . Hypertension   . 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 Wears dentures    full upper    Assessment/Plan: 4 Days Post-Op Procedure(s) (LRB): INTRAMEDULLARY (IM) NAIL INTERTROCHANTRIC (Right) Active Problems:   Closed intertrochanteric fracture of hip, right, initial encounter ([place])  Estimated body mass index is 30.54 kg/m as calculated from the following:   Height as of this encounter: 5' 7"  (1.702 m).   Weight as of this encounter: 88.5 kg. Advance diet Up with therapy D/C IV fluids Discharge to SNF when cleared by medicine  Hemoglobin 7.7.  Received 1 unit Saturday. Potassium improved to 3.7. Gastroenterology consult.  Appreciate assistance.  DVT Prophylaxis - Lovenox, Foot Pumps and TED hose Toe-touch weight-Bearing to right leg  Kimberly Dixon, PA-C Orthopaedic Surgery 02/01/2019, 7:17 AM

## 2019-02-01 NOTE — TOC Progression Note (Signed)
Transition of Care [facility]) - Progression Note    Patient Details  Name: Kimberly Hudson MRN: [id number] Date of Birth: 1955-11-28  Transition of Care [facility]) CM/SW [place], [place] Phone Number: 02/01/2019, 10:45 AM  Clinical Narrative:   CSW spoke with Otila Kluver at [facility] regarding patient's insurance authorization. Per Sibyl Parr was started this morning and is not expected to be completed today. CSW notified MD that patient will likely not get authorization today. CSW will continue to follow for discharge planning.     Expected Discharge Plan: Skilled Nursing Facility Barriers to Discharge: SNF Pending discharge orders, SNF Pending discharge summary, SNF Pending bed offer, Insurance Authorization  Expected Discharge Plan and Services Expected Discharge Plan: [place] Discharge Planning Services: CM Consult Post Acute Care Choice: [place] arrangements for the past 2 months: Group Home, Mobile Home(has 6 steps into home) Expected Discharge Date: 02/01/19                         Social Determinants of Health (SDOH) Interventions    Readmission Risk Interventions 30 Day Unplanned Readmission Risk Score     ED to Hosp-Admission (Current) from 01/27/2019 in [place] (1A)  30 Day Unplanned Readmission Risk Score (%)  22 Filed at 02/01/2019 0801     This score is the patient's risk of an unplanned readmission within 30 days of being discharged (0 -100%). The score is based on dignosis, age, lab data, medications, orders, and past utilization.   Low:  0-14.9   Medium: 15-21.9   High: 22-29.9   Extreme: 30 and above       No flowsheet data found.

## 2019-02-01 NOTE — Progress Notes (Signed)
Patient ID: Kimberly Hudson, female   DOB: 28-Jul-1956, 63 y.o.   MRN: [id number]  [employer] PROGRESS NOTE  JEWELLE WHITNER RDE:[id number] DOB: [birth year] DOA: 01/27/2019 PCP: Birdie Sons, MD  HPI/Subjective: Patient feeling okay.  Has some diarrhea.  Some pain in the hip.  Objective: Vitals:   02/01/19 0850 02/01/19 1109  BP: (!) 114/53   Pulse: 82 79  Resp: 17   Temp: 98.6 F (37 C)   SpO2: 96% 92%    Filed Weights   01/27/19 2240  Weight: 88.5 kg    ROS: Review of Systems  Constitutional: Negative for chills and fever.  Eyes: Negative for blurred vision.  Respiratory: Negative for cough and shortness of breath.   Cardiovascular: Negative for chest pain.  Gastrointestinal: Positive for diarrhea. Negative for abdominal pain, constipation, nausea and vomiting.  Genitourinary: Negative for dysuria.  Musculoskeletal: Positive for joint pain.  Neurological: Negative for dizziness and headaches.   Exam: Physical Exam  Constitutional: She is oriented to person, place, and time.  HENT:  Nose: No mucosal edema.  Mouth/Throat: No oropharyngeal exudate or posterior oropharyngeal edema.  Eyes: Pupils are equal, round, and reactive to light. Conjunctivae, EOM and lids are normal.  Neck: No JVD present. Carotid bruit is not present. No edema present. No thyroid mass and no thyromegaly present.  Cardiovascular: S1 normal and S2 normal. Exam reveals no gallop.  No murmur heard. Pulses:      Dorsalis pedis pulses are 2+ on the right side and 2+ on the left side.  Respiratory: No respiratory distress. She has decreased breath sounds in the right lower field and the left lower field. She has no wheezes. She has no rhonchi. She has no rales.  GI: Soft. Bowel sounds are normal. There is no abdominal tenderness.  Musculoskeletal:     Right ankle: She exhibits no swelling.     Left ankle: She exhibits no swelling.  Lymphadenopathy:    She has no cervical adenopathy.   Neurological: She is alert and oriented to person, place, and time. No cranial nerve deficit.  Skin: Skin is warm. No rash noted. Nails show no clubbing.  Psychiatric: She has a normal mood and affect.      Data Reviewed: Basic Metabolic Panel: Recent Labs  Lab 01/27/19 2247 01/28/19 0031 01/28/19 6314 01/29/19 0253 01/30/19 0407 01/31/19 0510 01/31/19 1937 02/01/19 0306  NA 137  --  137 140 142 141  --   --   K <2.0*  --  3.2* 3.7 3.7 2.9* 3.7 4.5  CL 96*  --  99 105 105 104  --   --   CO2 27  --  29 24 30 30   --   --   GLUCOSE 122*  --  117* 199* 103* 88  --   --   BUN 9  --  10 10 9 9   --   --   CREATININE 0.92  --  0.97 0.97 0.83 0.69  --  0.79  CALCIUM 7.6*  --  7.9* 7.4* 7.7* 7.5*  --   --   MG  --  1.6*  --   --  2.1  --   --   --   PHOS  --  2.2*  --   --  4.0  --   --   --    Liver Function Tests: Recent Labs  Lab 01/27/19 2247  AST 48*  ALT 18  ALKPHOS 154*  BILITOT 1.0  PROT 5.8*  ALBUMIN 2.3*    CBC: Recent Labs  Lab 01/27/19 2247 01/29/19 0253 01/30/19 0407 01/31/19 0510 02/01/19 0306  WBC 3.4* 2.4* 2.6* 3.0*  --   NEUTROABS 2.5  --   --   --   --   HGB 9.2* 7.4* 6.7* 7.7* 7.8*  HCT 26.9* 23.3* 20.9* 23.3*  --   MCV 113.0* 120.1* 120.8* 112.6*  --   PLT 290 231 194 182  --     Scheduled Meds: . sodium chloride   Intravenous Once  . budesonide  9 mg Oral Daily  . cyanocobalamin  1,000 mcg Intramuscular Daily  . dicyclomine  20 mg Oral TID AC  . enoxaparin (LOVENOX) injection  40 mg Subcutaneous Q24H  . feeding supplement (PRO-STAT SUGAR FREE 64)  30 mL Oral BID  . FLUoxetine  80 mg Oral Daily  . mirtazapine  7.5 mg Oral QHS  . mometasone-formoterol  2 puff Inhalation BID  . multivitamin with minerals  1 tablet Oral Daily  . pantoprazole  40 mg Oral BID  . [START ON 02/02/2019] potassium chloride  10 mEq Oral Daily  . pravastatin  10 mg Oral Daily  . rifaximin  550 mg Oral BID  . rOPINIRole  0.5 mg Oral QHS  . Vitamin D  (Ergocalciferol)  50,000 Units Oral Q3 days   Continuous Infusions: . methocarbamol (ROBAXIN) IV      Assessment/Plan:  1. Right intertrochanteric hip fracture requiring operative repair.  Potentially out to rehab tomorrow if insurance company approves. 2. Postoperative anemia and chronic anemia.  Patient given a unit of blood yesterday.  Hemoglobin up to 7.8 today.  Given IV Venofer yesterday.  B12 injection today 3. Crohn's disease with chronic diarrhea.  CT scan shows strictures.  Patient on Entocort and rifaximin. 4. Hypokalemia.  Give IV and oral potassium today.  Recheck tomorrow 5. Hypophosphatemia and hypomagnesemia replaced 6. Leukopenia. 7. Hyperlipidemia on pravastatin 8. B12 deficiency.  Daily B12 injections while in the hospital and weekly at rehab and then can go to monthly after that 9. Vitamin D deficiency weekly vitamin D supplementation  Code Status:     Code Status Orders  (From admission, onward)         Start     Ordered   01/28/19 0452  Full code  Continuous     01/28/19 0451        Code Status History    This patient has a current code status but no historical code status.     Disposition Plan: Awaiting insurance authorization for rehab.  Consultants:  Orthopedic surgery  Gastroenterology   Procedures:  ORIF right femur  Time spent: 35 minutes  [place]

## 2019-02-01 NOTE — Progress Notes (Signed)
Physical Therapy Treatment Patient Details Name: Kimberly Hudson MRN: [id number] DOB: 1956-02-20 Today's Date: 02/01/2019    History of Present Illness Pt is a 63 yo female with a PMH that includes Crohn's disease, anxiety, CAD, depression, GERD, and HTN presented to the ED after a fall resulting in a R intertrochanteric hip fracture.  Pt is s/p intramedullary nailing of R femur with cephalomedullary device.  MD assessment also includes: weakness, chronic diarrhea, hypochloremia, hyperglycemia, hypocalcemia, transaminasemia, hypoproteinemia/hypoalbuminemia, leukopenia (w/o neutropenia), and macrocytic anemia.     PT Comments    Pt agreeable to PT for bed exercises only. Pt reports initially 8/10 pain in RLE hip to knee, but increases to 10/10 with movement. Pt also complains of nausea, in which she was medicated for, but continues to experience. Pt very anxious during session with overt anxiety reactions to pain with movement exercises (grabbing leg, yelling, facial expression, tensing up). Poor QS noted with attempt on R. No further mobility attempts this session. Pt to be discharged to skilled nursing facility today to continue rehab efforts.    Follow Up Recommendations  SNF     Equipment Recommendations       Recommendations for Other Services       Precautions / Restrictions Precautions Precautions: Fall Restrictions Weight Bearing Restrictions: Yes RLE Weight Bearing: Touchdown weight bearing    Mobility  Bed Mobility               General bed mobility comments: not tested  Transfers                    Ambulation/Gait                 Stairs             Wheelchair Mobility    Modified Rankin (Stroke Patients Only)       Balance                                            Cognition Arousal/Alertness: Awake/alert Behavior During Therapy: Anxious Overall Cognitive Status: Within Functional Limits for tasks assessed                                        Exercises General Exercises - Lower Extremity Ankle Circles/Pumps: AROM;Both;20 reps Quad Sets: Strengthening;Both;10 reps(poor on R) Gluteal Sets: Strengthening;Both;10 reps Short Arc Quad: AAROM;Both;10 reps Heel Slides: AAROM;Both;10 reps Hip ABduction/ADduction: AAROM;Both;15 reps Straight Leg Raises: AAROM;Both;10 reps    General Comments        Pertinent Vitals/Pain Pain Assessment: 0-10 Pain Score: 8 (increases to 10 with supine bed exercises) Pain Location: R hip/thigh to knee Pain Descriptors / Indicators: Constant;Grimacing;Guarding;Moaning;Radiating;Other (Comment)(grabbing leg/yelling out/anxious) Pain Intervention(s): Limited activity within patient's tolerance;Monitored during session;Premedicated before session    Home Living                      Prior Function            PT Goals (current goals can now be found in the care plan section) Progress towards PT goals: Not progressing toward goals - comment    Frequency    BID      PT Plan Current plan remains appropriate    Co-evaluation  AM-PAC PT "6 Clicks" Mobility   Outcome Measure  Help needed turning from your back to your side while in a flat bed without using bedrails?: A Little Help needed moving from lying on your back to sitting on the side of a flat bed without using bedrails?: A Lot Help needed moving to and from a bed to a chair (including a wheelchair)?: A Lot Help needed standing up from a chair using your arms (e.g., wheelchair or bedside chair)?: Total Help needed to walk in hospital room?: Total Help needed climbing 3-5 steps with a railing? : Total 6 Click Score: 10    End of Session   Activity Tolerance: Patient limited by pain Patient left: in bed;with call bell/phone within reach;with bed alarm set   PT Visit Diagnosis: Unsteadiness on feet (R26.81);History of falling (Z91.81);Other  abnormalities of gait and mobility (R26.89);Muscle weakness (generalized) (M62.81)     Time: 4720-7218 PT Time Calculation (min) (ACUTE ONLY): 19 min  Charges:  $Therapeutic Exercise: 8-22 mins                      Larae Grooms, PTA 02/01/2019, 12:33 PM

## 2019-02-01 NOTE — Progress Notes (Signed)
Occupational Therapy Treatment Patient Details Name: Kimberly Hudson MRN: [id number] DOB: August 19, 1956 Today's Date: 02/01/2019    History of present illness Pt is a 63 yo female with a PMH that includes Crohn's disease, anxiety, CAD, depression, GERD, and HTN presented to the ED after a fall resulting in a R intertrochanteric hip fracture.  Pt is s/p intramedullary nailing of R femur with cephalomedullary device.  MD assessment also includes: weakness, chronic diarrhea, hypochloremia, hyperglycemia, hypocalcemia, transaminasemia, hypoproteinemia/hypoalbuminemia, leukopenia (w/o neutropenia), and macrocytic anemia.    OT comments  Pt seen for OT tx this date. Pt declining any functional mobility 2/2 significant R hip pain, but agreeable to bed level OT session. Pt/dtr instructed in cognitive behavioral pain coping strategies including progressive muscle relaxation, distraction, and pleasant imagery to improve pt's self mgt of pain. Pt became tearful and reported that she learned about some of these techniques through seeing a family member struggle with an opioid addiction previously. Emotional support provided. Brought pt tissues. Pt politely declined to speak with chaplain when offered by this therapist. Pt did endorse that when she is in pain that she will sometimes use her phone and play games on her phone to distract herself and that it "helps a bit" with her pain. Facilitated making the connection of her phone with a distraction strategy or "tool in her toolbox" for pain mgt. Pt verbalized understanding. Pt limited by pain this date. Will continue to progress as able.   Follow Up Recommendations  SNF    Equipment Recommendations  (TBD)    Recommendations for Other Services      Precautions / Restrictions Precautions Precautions: Fall Restrictions Weight Bearing Restrictions: Yes RLE Weight Bearing: Touchdown weight bearing Other Position/Activity Restrictions: Pt is TDWB with the foot to  be placed flat on the floor with ambulation (FFWB) with the weight of the leg only       Mobility Bed Mobility               General bed mobility comments: deferred 2/2 pain  Transfers                 General transfer comment: deferred 2/2 pain    Balance                                           ADL either performed or assessed with clinical judgement   ADL Overall ADL's : Needs assistance/impaired                                       General ADL Comments: set up/CGA for seated UB ADL, MAX A for LB ADL     Vision Baseline Vision/History: Wears glasses Wears Glasses: At all times Patient Visual Report: No change from baseline     Perception     Praxis      Cognition Arousal/Alertness: Awake/alert Behavior During Therapy: Anxious Overall Cognitive Status: Within Functional Limits for tasks assessed                                          Exercises Other Exercises Other Exercises: pt/dtr instructed in cognitive behavioral pain coping strategies including progressive muscle relaxation, distraction, and  pleasant imagery to improve pt's self mgt of pain   Shoulder Instructions       General Comments      Pertinent Vitals/ Pain       Pain Assessment: 0-10 Pain Score: 8  Pain Location: R hip/thigh to knee Pain Descriptors / Indicators: Aching;Guarding Pain Intervention(s): Limited activity within patient's tolerance;Monitored during session  Home Living                                          Prior Functioning/Environment              Frequency  Min 2X/week        Progress Toward Goals  OT Goals(current goals can now be found in the care plan section)  Progress towards OT goals: OT to reassess next treatment  Acute Rehab OT Goals Patient Stated Goal: To walk without falling down OT Goal Formulation: With patient Time For Goal Achievement: 02/12/19 Potential  to Achieve Goals: Good  Plan Discharge plan remains appropriate;Frequency remains appropriate    Co-evaluation                 AM-PAC OT "6 Clicks" Daily Activity     Outcome Measure   Help from another person eating meals?: None Help from another person taking care of personal grooming?: None Help from another person toileting, which includes using toliet, bedpan, or urinal?: A Lot Help from another person bathing (including washing, rinsing, drying)?: A Lot Help from another person to put on and taking off regular upper body clothing?: A Little Help from another person to put on and taking off regular lower body clothing?: A Lot 6 Click Score: 17    End of Session    OT Visit Diagnosis: Other abnormalities of gait and mobility (R26.89);Repeated falls (R29.6);Muscle weakness (generalized) (M62.81);Pain Pain - Right/Left: Right Pain - part of body: Hip   Activity Tolerance Patient limited by pain   Patient Left in bed;with call bell/phone within reach;with bed alarm set;with family/visitor present   Nurse Communication          Time: 2956-2130 OT Time Calculation (min): 18 min  Charges: OT General Charges $OT Visit: 1 Visit OT Treatments $Therapeutic Activity: 8-22 mins  Jeni Salles, MPH, MS, OTR/L ascom [phone] 02/01/19, 4:18 PM

## 2019-02-01 NOTE — Discharge Summary (Addendum)
[facility] NAME: Kimberly Hudson    MR#:  [id number]  DATE OF BIRTH:  1956/03/09  DATE OF ADMISSION:  01/27/2019 ADMITTING PHYSICIAN: Arta Silence, MD  DATE OF DISCHARGE: 02/02/2019  PRIMARY CARE PHYSICIAN: Birdie Sons, MD    ADMISSION DIAGNOSIS:  Hypokalemia [E87.6] Closed nondisplaced intertrochanteric fracture of left femur, initial encounter ([place]) [S72.145A] Closed intertrochanteric fracture of hip, right, initial encounter ([place]) [S72.141A]  DISCHARGE DIAGNOSIS:  Active Problems:   Closed intertrochanteric fracture of hip, right, initial encounter ([place])   SECONDARY DIAGNOSIS:   Past Medical History:  Diagnosis Date  . Anemia    past hx  . Anxiety   . Arthritis    osteo - shoulders, hips, knees  . Complication of anesthesia    propofol causes headaches  . Coronary artery disease   . Crohn's disease ([place])   . Depression   . GERD (gastroesophageal reflux disease)   . History of chicken pox   . History of measles   . History of mumps   . Hypertension   . 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 Wears dentures    full upper    HOSPITAL COURSE:   1.  Right intertrochanteric hip fracture requiring operative repair.  Patient can be discharged out to rehab once insurance authorizes.  Lovenox and pain medications ordered by orthopedics. 2.  Postoperative anemia on top of chronic anemia.  The patient was given 1 unit of blood.  Today's hemoglobin 7.8.  Gave IV Venofer during hospital course.  Patient is also B12 deficient and will be given B12 injections once a week for 1 month and then can be spread to monthly injections. 3.  Crohn's disease with chronic diarrhea.  CT scan shows strictures.  GI recommended rifamixin and Entocort. 4.  Hypokalemia.  Patient was given potassium during the hospital course.  Last potassium in normal range I will give low-dose K-Lor upon discharge. 5.  Hypophosphatemia  and hypomagnesemia these were replaced during the hospital course 6.  Leukopenia 7.  Hyperlipidemia on pravastatin 8.  B12 deficiency given daily injections on the hospital course.  We will give weekly injections upon getting out of the hospital. 9.  Vitamin D deficiency.  Vitamin D supplementation weekly  DISCHARGE CONDITIONS:   Satisfactory  CONSULTS OBTAINED:  Treatment Team:  Arta Silence, MD Leim Fabry, MD  DRUG ALLERGIES:   Allergies  Allergen Reactions  . Nsaids Other (See Comments)    Because of Crohns Because of Crohns  . Sulfa Antibiotics Rash    DISCHARGE MEDICATIONS:   Allergies as of 02/01/2019      Reactions   Nsaids Other (See Comments)   Because of Crohns Because of Crohns   Sulfa Antibiotics Rash      Medication List    STOP taking these medications   azaTHIOprine 50 MG tablet Commonly known as:  IMURAN   doxycycline 100 MG tablet Commonly known as:  VIBRA-TABS   fluconazole 100 MG tablet Commonly known as:  DIFLUCAN   HYDROcodone-acetaminophen 7.5-325 MG tablet Commonly known as:  NORCO   lisinopril-hydrochlorothiazide 10-12.5 MG tablet Commonly known as:  PRINZIDE,ZESTORETIC   mirtazapine 7.5 MG tablet Commonly known as:  REMERON   montelukast 10 MG tablet Commonly known as:  SINGULAIR   oseltamivir 75 MG capsule Commonly known as:  Tamiflu   promethazine-dextromethorphan 6.25-15 MG/5ML syrup Commonly known as:  PROMETHAZINE-DM     TAKE these medications   budesonide 3 MG 24  hr capsule Commonly known as:  ENTOCORT EC Take 3 capsules (9 mg total) by mouth daily. Start taking on:  February 02, 2019   clonazePAM 0.5 MG tablet Commonly known as:  KLONOPIN Take 1 tablet (0.5 mg total) by mouth 3 (three) times daily as needed for anxiety. Take half tablet at bedtime tuesdays and saturdays and continue 1 tablet rest of the days What changed:    medication strength  how much to take  when to take this  reasons to take  this   cyanocobalamin 1000 MCG/ML injection Commonly known as:  (VITAMIN B-12) Inject 1 mL (1,000 mcg total) into the muscle once a week. Start taking on:  February 08, 2019   cyclobenzaprine 5 MG tablet Commonly known as:  FLEXERIL TAKE 1 TABLET BY MOUTH THREE TIMES A DAY AS NEEDED FOR MUSCLE SPASMS   dicyclomine 20 MG tablet Commonly known as:  BENTYL TAKE 1 TABLET (20 MG TOTAL) BY MOUTH 3 (THREE) TIMES DAILY BEFORE MEALS.   diphenoxylate-atropine 2.5-0.025 MG tablet Commonly known as:  LOMOTIL Take 1 tablet by mouth 4 (four) times daily as needed for diarrhea or loose stools.   enoxaparin 40 MG/0.4ML injection Commonly known as:  LOVENOX Inject 0.4 mLs (40 mg total) into the skin daily.   feeding supplement (PRO-STAT SUGAR FREE 64) Liqd Take 30 mLs by mouth 2 (two) times daily.   FLUoxetine 40 MG capsule Commonly known as:  PROZAC Take 2 capsules (80 mg total) by mouth daily.   fluticasone 50 MCG/ACT nasal spray Commonly known as:  FLONASE Place 2 sprays into both nostrils daily.   multivitamin with minerals Tabs tablet Take 1 tablet by mouth daily. Start taking on:  February 02, 2019   oxyCODONE 5 MG immediate release tablet Commonly known as:  Oxy IR/ROXICODONE Take 1-2 tablets (5-10 mg total) by mouth every 4 (four) hours as needed for severe pain (pain score 7-10).   pantoprazole 40 MG tablet Commonly known as:  PROTONIX TAKE 1 TABLET BY MOUTH TWICE A DAY   potassium chloride 10 MEQ tablet Commonly known as:  K-DUR,KLOR-CON Take 1 tablet (10 mEq total) by mouth daily. Start taking on:  February 02, 2019   pravastatin 10 MG tablet Commonly known as:  PRAVACHOL TAKE 1 TABLET BY MOUTH EVERY DAY   promethazine 25 MG tablet Commonly known as:  PHENERGAN TAKE 1 TABLET BY MOUTH EVERY 6 HOURS AS NEEDED FOR NAUSEA AND VOMITING   rifaximin 550 MG Tabs tablet Commonly known as:  XIFAXAN Take 1 tablet (550 mg total) by mouth 2 (two) times daily.   rOPINIRole 0.5 MG  tablet Commonly known as:  REQUIP TAKE 1/2 TAB AT BEDTIME X3 DAYS, THEN 1 AT BEDTIME X3 DAYS, THEN INCREASE TO 2 AT BEDTIME IF NEEDED.   Symbicort 80-4.5 MCG/ACT inhaler Generic drug:  budesonide-formoterol TAKE 2 PUFFS BY MOUTH TWICE A DAY   traMADol 50 MG tablet Commonly known as:  ULTRAM Take 1 tablet (50 mg total) by mouth every 6 (six) hours as needed for moderate pain.   Vitamin D (Ergocalciferol) 1.25 MG (50000 UT) Caps capsule Commonly known as:  DRISDOL Take 1 capsule (50,000 Units total) by mouth every 7 (seven) days for 20 doses.        DISCHARGE INSTRUCTIONS:   Follow-up with team at rehab Follow-up orthopedics as outpatient  If you experience worsening of your admission symptoms, develop shortness of breath, life threatening emergency, suicidal or homicidal thoughts you must seek medical attention immediately  by calling 911 or calling your MD immediately  if symptoms less severe.  You Must read complete instructions/literature along with all the possible adverse reactions/side effects for all the Medicines you take and that have been prescribed to you. Take any new Medicines after you have completely understood and accept all the possible adverse reactions/side effects.   Please note  You were cared for by a hospitalist during your hospital stay. If you have any questions about your discharge medications or the care you received while you were in the hospital after you are discharged, you can call the unit and asked to speak with the hospitalist on call if the hospitalist that took care of you is not available. Once you are discharged, your primary care physician will handle any further medical issues. Please note that NO REFILLS for any discharge medications will be authorized once you are discharged, as it is imperative that you return to your primary care physician (or establish a relationship with a primary care physician if you do not have one) for your aftercare  needs so that they can reassess your need for medications and monitor your lab values.    Today   CHIEF COMPLAINT:   Chief Complaint  Patient presents with  . Fall    HISTORY OF PRESENT ILLNESS:  Helana Macbride  is a 63 y.o. female presented after a fall   VITAL SIGNS:  Blood pressure (!) 114/53, pulse 79, temperature 98.6 F (37 C), resp. rate 17, height 5' 7"  (1.702 m), weight 88.5 kg, SpO2 92 %.   PHYSICAL EXAMINATION:  GENERAL:  63 y.o.-year-old patient lying in the bed with no acute distress.  EYES: Pupils equal, round, reactive to light and accommodation. No scleral icterus. Extraocular muscles intact.  HEENT: Head atraumatic, normocephalic. Oropharynx and nasopharynx clear.  NECK:  Supple, no jugular venous distention. No thyroid enlargement, no tenderness.  LUNGS: Normal breath sounds bilaterally, no wheezing, rales,rhonchi or crepitation. No use of accessory muscles of respiration.  CARDIOVASCULAR: S1, S2 normal. No murmurs, rubs, or gallops.  ABDOMEN: Soft, non-tender, non-distended. Bowel sounds present. No organomegaly or mass.  EXTREMITIES: No pedal edema, cyanosis, or clubbing.  NEUROLOGIC: Cranial nerves II through XII are intact. Muscle strength 5/5 in all extremities. Sensation intact. Gait not checked.  PSYCHIATRIC: The patient is alert and oriented x 3.  SKIN: No obvious rash, lesion, or ulcer.   DATA REVIEW:   CBC Recent Labs  Lab 01/31/19 0510 02/01/19 0306  WBC 3.0*  --   HGB 7.7* 7.8*  HCT 23.3*  --   PLT 182  --     Chemistries  Recent Labs  Lab 01/27/19 2247  01/30/19 0407 01/31/19 0510  02/01/19 0306  NA 137   < > 142 141  --   --   K <2.0*   < > 3.7 2.9*   < > 4.5  CL 96*   < > 105 104  --   --   CO2 27   < > 30 30  --   --   GLUCOSE 122*   < > 103* 88  --   --   BUN 9   < > 9 9  --   --   CREATININE 0.92   < > 0.83 0.69  --  0.79  CALCIUM 7.6*   < > 7.7* 7.5*  --   --   MG  --    < > 2.1  --   --   --  AST 48*  --   --   --    --   --   ALT 18  --   --   --   --   --   ALKPHOS 154*  --   --   --   --   --   BILITOT 1.0  --   --   --   --   --    < > = values in this interval not displayed.    Management plans discussed with the patient, and she is in agreement.  CODE STATUS:     Code Status Orders  (From admission, onward)         Start     Ordered   01/28/19 0452  Full code  Continuous     01/28/19 0451        Code Status History    This patient has a current code status but no historical code status.      TOTAL TIME TAKING CARE OF THIS PATIENT: 35 minutes.    Loletha Grayer M.D on 02/01/2019 at 1:57 PM  Between 7am to 6pm - Pager - [phone]  After 6pm go to [email or web address] - password [email or web address]  [facility] Office  [phone]  CC: Primary care physician; Birdie Sons, MD

## 2019-02-01 NOTE — Care Management Important Message (Signed)
Important Message  Patient Details  Name: Kimberly Hudson MRN: [id number] Date of Birth: 06-Jan-1956   Medicare Important Message Given:  Yes    Dannette Barbara 02/01/2019, 10:02 AM

## 2019-02-01 NOTE — Care Management (Signed)
Printed medications reviewed and compared to Discharge summary no discrepancies noted

## 2019-02-01 NOTE — Progress Notes (Signed)
OT Cancellation Note  Patient Details Name: MOYA DUAN MRN: [id number] DOB: 11/17/1956   Cancelled Treatment:    Reason Eval/Treat Not Completed: Patient declined, no reason specified. Upon attempt for OT treatment, pt on bed pan and reports having pain earlier with PT session. Pt agreeable to OT re-attempting this afternoon.   Jeni Salles, MPH, MS, OTR/L ascom [phone] 02/01/19, 2:31 PM

## 2019-02-02 LAB — CBC
HCT: 25.5 % — ABNORMAL LOW (ref 36.0–46.0)
Hemoglobin: 7.9 g/dL — ABNORMAL LOW (ref 12.0–15.0)
MCH: 37.6 pg — AB (ref 26.0–34.0)
MCHC: 31 g/dL (ref 30.0–36.0)
MCV: 121.4 fL — ABNORMAL HIGH (ref 80.0–100.0)
Platelets: 226 10*3/uL (ref 150–400)
RBC: 2.1 MIL/uL — ABNORMAL LOW (ref 3.87–5.11)
RDW: 24.5 % — ABNORMAL HIGH (ref 11.5–15.5)
WBC: 3.6 10*3/uL — ABNORMAL LOW (ref 4.0–10.5)
nRBC: 0 % (ref 0.0–0.2)

## 2019-02-02 NOTE — Progress Notes (Signed)
Nutrition Follow Up Note   DOCUMENTATION CODES:   Obesity unspecified  INTERVENTION:   - MVI with minerals daily  - Pro-stat 30 ml BID, each supplement provides 100 kcal and 15 grams of protein  -Recommend ergocalciferol/cholecalciferol 50,000 units 3 times weekly and recheck labs in 30 days  -Recommend 1026mg B12 IM injection daily x 7 days followed by 10059m IM injection monthly and recheck labs in 3-6 months  -Recommend 65-2005mlemental iron supplementation every other day and recheck in 6-12 months.   -Pt may benefit from Questran to help with bile salt diarrhea   NUTRITION DIAGNOSIS:   Increased nutrient needs related to post-op healing as evidenced by increased estimated needs.  GOAL:   Patient will meet greater than or equal to 90% of their needs  -progressing   MONITOR:   PO intake, Supplement acceptance, Labs, Weight trends, Skin, I & O's  ASSESSMENT:   [age]ar old female who presented to the ED on 3/11 after a fall. PMH of Crohn's disease on immunotherapy, chronic diarrhea, HTN. Pt found to have R intertrochanteric hip fracture.   3/12 - s/p intramedullary nailing of R femur with cephalomedullary device  Pt with improved appetite and oral intake; pt eating 30-90% of meals and taking Prostat supplements. Pt's vitamin D, iron, and B12 labs returned low; recommendations for supplementation above. Pt at increased risk for nutrient deficiencies r/t her Crohn's disease that is affecting her distal small bowel/ileum. Pt with chronic diarrhea; pt may benefit from Questran to help with bile salt diarrhea. No new weight since admit; will request new weight. Recommend continue protein supplements after discharge to help maintain lean muscle mass. Pt awaiting SNF.   Medications reviewed and include: B12, lovenox, remeron, MVI, protonix, vitamin D (ergo)   Labs reviewed: K 4.5 wnl- 3/16 P 4.0 wnl, Mg 2.1 wnl- 3/14 Iron 21(L), TIBC 152(L), ferritin 70, folate 9.9-  3/13 Vitamin D- 5.6(L)- 3/14 B12- 117(L)- 3/12 Wbc- 3.6(L), Hgb 7.9(L), Hct 25.5(L), MCV 121.4(H), MCH 37.6(H)  Diet Order:   Diet Order            DIET SOFT Room service appropriate? Yes; Fluid consistency: Thin  Diet effective now             EDUCATION NEEDS:   Education needs have been addressed  Skin:  Skin Assessment: Skin Integrity Issues: Incisions: closed surgical incision to R hip  Last BM:  3/16- TYPE 2  Height:   Ht Readings from Last 1 Encounters:  01/27/19 5' 7"  (1.702 m)    Weight:   Wt Readings from Last 1 Encounters:  01/27/19 88.5 kg    Ideal Body Weight:  61.4 kg  BMI:  Body mass index is 30.54 kg/m.  Estimated Nutritional Needs:   Kcal:  1800-2100kcal/day   Protein:  90-105 grams  Fluid:  >1.8L/day   CasKoleen Distance, RD, LDN Pager #- [phone]#- [id number] Hours Pager: [phone]

## 2019-02-02 NOTE — Progress Notes (Signed)
  Subjective: 5 Days Post-Op Procedure(s) (LRB): INTRAMEDULLARY (IM) NAIL INTERTROCHANTRIC (Right) Patient reports pain as mild to moderate.   Patient seen in rounds with Dr. Posey Pronto. Patient is well, and has had no acute complaints or problems Plan is to go Rehab after hospital stay. Negative for chest pain and shortness of breath Fever: No fever. Gastrointestinal: Negative for nausea and vomiting.     Objective: Vital signs in last 24 hours: Temp:  [98.1 F (36.7 C)-98.6 F (37 C)] 98.1 F (36.7 C) (03/16 2355) Pulse Rate:  [74-82] 74 (03/16 2355) Resp:  [17-18] 18 (03/16 2355) BP: (91-114)/(51-56) 91/51 (03/16 2355) SpO2:  [92 %-98 %] 98 % (03/16 2355)  Intake/Output from previous day:  Intake/Output Summary (Last 24 hours) at 02/02/2019 0714 Last data filed at 02/01/2019 1700 Gross per 24 hour  Intake 360 ml  Output -  Net 360 ml    Intake/Output this shift: No intake/output data recorded.  Labs: Recent Labs    01/31/19 0510 02/01/19 0306 02/02/19 0656  HGB 7.7* 7.8* 7.9*   Recent Labs    01/31/19 0510 02/02/19 0656  WBC 3.0* 3.6*  RBC 2.07* 2.10*  HCT 23.3* 25.5*  PLT 182 226   Recent Labs    01/31/19 0510 01/31/19 1937 02/01/19 0306  NA 141  --   --   K 2.9* 3.7 4.5  CL 104  --   --   CO2 30  --   --   BUN 9  --   --   CREATININE 0.69  --  0.79  GLUCOSE 88  --   --   CALCIUM 7.5*  --   --    No results for input(s): LABPT, INR in the last 72 hours.   EXAM General - Patient is Alert and Oriented Extremity - Neurovascular intact Sensation intact distally Compartment soft Dressing/Incision - clean, dry, scant drainage involving the most proximal wound.   Motor Function - intact, moving foot and toes well on exam.  Ambulated bed to chair with physical therapy.  Past Medical History:  Diagnosis Date  . Anemia    past hx  . Anxiety   . Arthritis    osteo - shoulders, hips, knees  . Complication of anesthesia    propofol causes headaches   . Coronary artery disease   . Crohn's disease ([place])   . Depression   . GERD (gastroesophageal reflux disease)   . History of chicken pox   . History of measles   . History of mumps   . Hypertension   . 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 Wears dentures    full upper    Assessment/Plan: 5 Days Post-Op Procedure(s) (LRB): INTRAMEDULLARY (IM) NAIL INTERTROCHANTRIC (Right) Active Problems:   Closed intertrochanteric fracture of hip, right, initial encounter ([place])  Estimated body mass index is 30.54 kg/m as calculated from the following:   Height as of this encounter: 5' 7"  (1.702 m).   Weight as of this encounter: 88.5 kg. Advance diet Up with therapy D/C IV fluids Discharge to SNF when cleared by medicine  Hemoglobin 7.9.  Received 1 unit Saturday. Gastroenterology consult.  Appreciate assistance.  DVT Prophylaxis - Lovenox, Foot Pumps and TED hose Toe-touch weight-Bearing to right leg Follow-up at [facility] clinic in 2 weeks for staple removal  Reche Dixon, PA-C Orthopaedic Surgery 02/02/2019, 7:14 AM

## 2019-02-02 NOTE — Care Management (Signed)
Called tina at [facility] to check on Auth status, No auth received yet.

## 2019-02-02 NOTE — Progress Notes (Signed)
Patient ID: Kimberly Hudson, female   DOB: December 25, 1955, 63 y.o.   MRN: [id number]   [facility] PROGRESS NOTE  Kimberly Hudson TZG:[id number] DOB: 12/16/1955 DOA: 01/27/2019 PCP: Birdie Sons, MD  HPI/Subjective: Patient feels okay.  She states that she is ready to get out of the hospital.  Some pain in the hip.  Always has a little diarrhea.  Objective: Vitals:   02/02/19 0749 02/02/19 1206  BP: (!) 105/53   Pulse: 74 75  Resp: 18   Temp: 98.3 F (36.8 C)   SpO2: 94% 95%    Filed Weights   01/27/19 2240  Weight: 88.5 kg    ROS: Review of Systems  Constitutional: Negative for chills and fever.  Eyes: Negative for blurred vision.  Respiratory: Negative for cough and shortness of breath.   Cardiovascular: Negative for chest pain.  Gastrointestinal: Positive for diarrhea. Negative for abdominal pain, constipation, nausea and vomiting.  Genitourinary: Negative for dysuria.  Musculoskeletal: Positive for joint pain.  Neurological: Negative for dizziness and headaches.   Exam: Physical Exam  Constitutional: She is oriented to person, place, and time.  HENT:  Nose: No mucosal edema.  Mouth/Throat: No oropharyngeal exudate or posterior oropharyngeal edema.  Eyes: Pupils are equal, round, and reactive to light. Conjunctivae, EOM and lids are normal.  Neck: No JVD present. Carotid bruit is not present. No edema present. No thyroid mass and no thyromegaly present.  Cardiovascular: S1 normal and S2 normal. Exam reveals no gallop.  No murmur heard. Pulses:      Dorsalis pedis pulses are 2+ on the right side and 2+ on the left side.  Respiratory: No respiratory distress. She has decreased breath sounds in the right lower field and the left lower field. She has no wheezes. She has no rhonchi. She has no rales.  GI: Soft. Bowel sounds are normal. There is no abdominal tenderness.  Musculoskeletal:     Right ankle: She exhibits no swelling.     Left ankle: She exhibits no  swelling.  Lymphadenopathy:    She has no cervical adenopathy.  Neurological: She is alert and oriented to person, place, and time. No cranial nerve deficit.  Skin: Skin is warm. No rash noted. Nails show no clubbing.  Psychiatric: She has a normal mood and affect.      Data Reviewed: Basic Metabolic Panel: Recent Labs  Lab 01/27/19 2247 01/28/19 0031 01/28/19 0847 01/29/19 0253 01/30/19 0407 01/31/19 0510 01/31/19 1937 02/01/19 0306  NA 137  --  137 140 142 141  --   --   K <2.0*  --  3.2* 3.7 3.7 2.9* 3.7 4.5  CL 96*  --  99 105 105 104  --   --   CO2 27  --  29 24 30 30   --   --   GLUCOSE 122*  --  117* 199* 103* 88  --   --   BUN 9  --  10 10 9 9   --   --   CREATININE 0.92  --  0.97 0.97 0.83 0.69  --  0.79  CALCIUM 7.6*  --  7.9* 7.4* 7.7* 7.5*  --   --   MG  --  1.6*  --   --  2.1  --   --   --   PHOS  --  2.2*  --   --  4.0  --   --   --    Liver Function Tests: Recent Labs  Lab 01/27/19 2247  AST 48*  ALT 18  ALKPHOS 154*  BILITOT 1.0  PROT 5.8*  ALBUMIN 2.3*    CBC: Recent Labs  Lab 01/27/19 2247 01/29/19 0253 01/30/19 0407 01/31/19 0510 02/01/19 0306 02/02/19 0656  WBC 3.4* 2.4* 2.6* 3.0*  --  3.6*  NEUTROABS 2.5  --   --   --   --   --   HGB 9.2* 7.4* 6.7* 7.7* 7.8* 7.9*  HCT 26.9* 23.3* 20.9* 23.3*  --  25.5*  MCV 113.0* 120.1* 120.8* 112.6*  --  121.4*  PLT 290 231 194 182  --  226    Scheduled Meds: . sodium chloride   Intravenous Once  . budesonide  9 mg Oral Daily  . cyanocobalamin  1,000 mcg Intramuscular Daily  . dicyclomine  20 mg Oral TID AC  . enoxaparin (LOVENOX) injection  40 mg Subcutaneous Q24H  . feeding supplement (PRO-STAT SUGAR FREE 64)  30 mL Oral BID  . FLUoxetine  80 mg Oral Daily  . mirtazapine  7.5 mg Oral QHS  . mometasone-formoterol  2 puff Inhalation BID  . multivitamin with minerals  1 tablet Oral Daily  . pantoprazole  40 mg Oral BID  . potassium chloride  10 mEq Oral Daily  . pravastatin  10 mg Oral  Daily  . rifaximin  550 mg Oral BID  . rOPINIRole  0.5 mg Oral QHS  . Vitamin D (Ergocalciferol)  50,000 Units Oral Q3 days   Continuous Infusions: . methocarbamol (ROBAXIN) IV      Assessment/Plan:  1. Right intertrochanteric hip fracture requiring operative repair.  Patient to go to rehab once insurance authorizes.  If insurance authorizes today she will go.  Prophylactic Lovenox and pain medications ordered by orthopedic surgery 2. Postoperative anemia and chronic anemia.  Patient given a unit of blood yesterday.  Hemoglobin up to 7.8 today.  Given IV Venofer yesterday.  B12 injection daily while in the hospital and weekly while over at rehab 3. Crohn's disease with chronic diarrhea.  CT scan shows strictures.  Patient on Entocort and rifaximin. 4. Hypokalemia.  Replace.  Give low-dose replacement orally upon discharge. 5. Hypophosphatemia and hypomagnesemia replaced 6. Leukopenia. 7. Hyperlipidemia on pravastatin 8. B12 deficiency.  Daily B12 injections while in the hospital and weekly at rehab and then can go to monthly after that 9. Vitamin D deficiency weekly vitamin D supplementation  Code Status:     Code Status Orders  (From admission, onward)         Start     Ordered   01/28/19 0452  Full code  Continuous     01/28/19 0451        Code Status History    This patient has a current code status but no historical code status.     Disposition Plan: Awaiting insurance authorization for rehab.  Consultants:  Orthopedic surgery  Gastroenterology   Procedures:  ORIF right femur  Time spent: 32 minutes  [place]

## 2019-02-02 NOTE — Progress Notes (Signed)
Physical Therapy Treatment Patient Details Name: Kimberly Hudson MRN: [id number] DOB: 1955-11-22 Today's Date: 02/02/2019    History of Present Illness Pt is a 63 yo female with a PMH that includes Crohn's disease, anxiety, CAD, depression, GERD, and HTN presented to the ED after a fall resulting in a R intertrochanteric hip fracture.  Pt is s/p intramedullary nailing of R femur with cephalomedullary device.  MD assessment also includes: weakness, chronic diarrhea, hypochloremia, hyperglycemia, hypocalcemia, transaminasemia, hypoproteinemia/hypoalbuminemia, leukopenia (w/o neutropenia), and macrocytic anemia.     PT Comments    Pt reports not feeling too well due to Chron's disease and abdominal upset. Pt agreeable with encouragement to continued work on exercises, but declines up in bed/out of bed. Pt c/o having R thigh cramping with QS work; educated on using half power/strength. No active cramping with performance this session. Pt also educated on self assist for R heel slide using a towel under thigh/knee working toward greater range. Pt pleased with technique. Continue PT progress participation, range and strength to improve functional mobility.    Follow Up Recommendations  SNF     Equipment Recommendations       Recommendations for Other Services       Precautions / Restrictions Precautions Precautions: Fall Restrictions Weight Bearing Restrictions: Yes RLE Weight Bearing: Touchdown weight bearing    Mobility  Bed Mobility               General bed mobility comments: Not tested  Transfers                    Ambulation/Gait                 Stairs             Wheelchair Mobility    Modified Rankin (Stroke Patients Only)       Balance                                            Cognition Arousal/Alertness: Awake/alert Behavior During Therapy: WFL for tasks assessed/performed Overall Cognitive Status: Within  Functional Limits for tasks assessed                                        Exercises General Exercises - Lower Extremity Ankle Circles/Pumps: AROM;Both;20 reps Quad Sets: Strengthening;Both;10 reps;Other (comment)(educated on half power due to c/o cramping) Gluteal Sets: Strengthening;Both;10 reps Short Arc Quad: AAROM;Both;10 reps Heel Slides: AAROM;Both;10 reps(educated on self assist with towel) Hip ABduction/ADduction: AAROM;Both;10 reps Straight Leg Raises: AAROM;Both;10 reps;Other (comment)(limited on R)    General Comments        Pertinent Vitals/Pain Pain Assessment: (c/o abdominal distress due to Chron's disease) Pain Score: 8 (With movement; "okay" at rest) Pain Location: R hip/thigh to knee Pain Descriptors / Indicators: Aching;Guarding Pain Intervention(s): Limited activity within patient's tolerance    Home Living                      Prior Function            PT Goals (current goals can now be found in the care plan section) Progress towards PT goals: Progressing toward goals(slowly)    Frequency    BID      PT Plan Current  plan remains appropriate    Co-evaluation              AM-PAC PT "6 Clicks" Mobility   Outcome Measure  Help needed turning from your back to your side while in a flat bed without using bedrails?: A Little Help needed moving from lying on your back to sitting on the side of a flat bed without using bedrails?: A Lot Help needed moving to and from a bed to a chair (including a wheelchair)?: A Lot Help needed standing up from a chair using your arms (e.g., wheelchair or bedside chair)?: Total Help needed to walk in hospital room?: Total Help needed climbing 3-5 steps with a railing? : Total 6 Click Score: 10    End of Session Equipment Utilized During Treatment: Gait belt Activity Tolerance: Patient limited by pain Patient left: in bed;with call bell/phone within reach;with bed alarm set   PT  Visit Diagnosis: Unsteadiness on feet (R26.81);History of falling (Z91.81);Other abnormalities of gait and mobility (R26.89);Muscle weakness (generalized) (M62.81)     Time: 0404-5913 PT Time Calculation (min) (ACUTE ONLY): 19 min  Charges:  $Therapeutic Exercise: 8-22 mins                      Larae Grooms, PTA 02/02/2019, 2:05 PM

## 2019-02-02 NOTE — Progress Notes (Signed)
Physical Therapy Treatment Patient Details Name: Kimberly Hudson MRN: [id number] DOB: [birth year] Today's Date: 02/02/2019    History of Present Illness Pt is a 63 yo female with a PMH that includes Crohn's disease, anxiety, CAD, depression, GERD, and HTN presented to the ED after a fall resulting in a R intertrochanteric hip fracture.  Pt is s/p intramedullary nailing of R femur with cephalomedullary device.  MD assessment also includes: weakness, chronic diarrhea, hypochloremia, hyperglycemia, hypocalcemia, transaminasemia, hypoproteinemia/hypoalbuminemia, leukopenia (w/o neutropenia), and macrocytic anemia.     PT Comments    Treatment attempt twice; pt on bed pan. Third attempt, pt agreeable to supine bed exercises, noting pain in RLE is "okay" when not moving. Increases to an 8 with movement. Pt participates in limited supine exercises with limited range overall in RLE. Continue PT to progress participation, range and strength to progress all functional mobility.    Follow Up Recommendations  SNF     Equipment Recommendations       Recommendations for Other Services       Precautions / Restrictions Precautions Precautions: Fall Restrictions Weight Bearing Restrictions: Yes RLE Weight Bearing: Touchdown weight bearing    Mobility  Bed Mobility               General bed mobility comments: Not tested  Transfers                    Ambulation/Gait                 Stairs             Wheelchair Mobility    Modified Rankin (Stroke Patients Only)       Balance                                            Cognition Arousal/Alertness: Awake/alert Behavior During Therapy: Anxious                                          Exercises General Exercises - Lower Extremity Ankle Circles/Pumps: AROM;Both;20 reps Quad Sets: Strengthening;Both;10 reps;Other (comment)(decreased on R) Gluteal Sets:  Strengthening;Both;10 reps Short Arc Quad: AAROM;Both;10 reps Heel Slides: AAROM;Both;10 reps(limited on R) Hip ABduction/ADduction: AAROM;Both;10 reps Straight Leg Raises: AAROM;Both;10 reps;Other (comment)(limited on R)    General Comments        Pertinent Vitals/Pain Pain Assessment: 0-10 Pain Score: 8 (With movement; "okay" at rest) Pain Location: R hip/thigh to knee Pain Descriptors / Indicators: Aching;Guarding Pain Intervention(s): Limited activity within patient's tolerance    Home Living                      Prior Function            PT Goals (current goals can now be found in the care plan section) Progress towards PT goals: Progressing toward goals    Frequency    BID      PT Plan Current plan remains appropriate    Co-evaluation              AM-PAC PT "6 Clicks" Mobility   Outcome Measure  Help needed turning from your back to your side while in a flat bed without using bedrails?: A Little Help needed moving from  lying on your back to sitting on the side of a flat bed without using bedrails?: A Lot Help needed moving to and from a bed to a chair (including a wheelchair)?: A Lot Help needed standing up from a chair using your arms (e.g., wheelchair or bedside chair)?: Total Help needed to walk in hospital room?: Total Help needed climbing 3-5 steps with a railing? : Total 6 Click Score: 10    End of Session Equipment Utilized During Treatment: Gait belt Activity Tolerance: Patient limited by pain Patient left: in bed;with call bell/phone within reach;with bed alarm set   PT Visit Diagnosis: Unsteadiness on feet (R26.81);History of falling (Z91.81);Other abnormalities of gait and mobility (R26.89);Muscle weakness (generalized) (M62.81)     Time: 6147-0929 PT Time Calculation (min) (ACUTE ONLY): 19 min  Charges:  $Therapeutic Exercise: 8-22 mins                      Larae Grooms, PTA 02/02/2019, 12:49 PM

## 2019-02-03 ENCOUNTER — Other Ambulatory Visit: Payer: Self-pay | Admitting: Gastroenterology

## 2019-02-03 NOTE — Progress Notes (Addendum)
Occupational Therapy Treatment Patient Details Name: Kimberly Hudson MRN: [id number] DOB: Sep 04, 1956 Today's Date: 02/03/2019    History of present illness Pt is a 63 yo female with a PMH that includes Crohn's disease, anxiety, CAD, depression, GERD, and HTN presented to the ED after a fall resulting in a R intertrochanteric hip fracture.  Pt is s/p intramedullary nailing of R femur with cephalomedullary device.  MD assessment also includes: weakness, chronic diarrhea, hypochloremia, hyperglycemia, hypocalcemia, transaminasemia, hypoproteinemia/hypoalbuminemia, leukopenia (w/o neutropenia), and macrocytic anemia.    OT comments  Pt. education was provided about A/E use for LE ADLs, and IADL tasks.  Pt. continues to require extensive assist for LE ADLs. Pt. reports having a reacher at home, and pans to use it upon discharge. Pt. reports that her daughter will assist her with all LE ADL tasks as needed, reporting that they take good care of each other. Pt. could benefit from a shower seat. Pt. continues to benefit from OT services for ADL training, A/E training, and pt. education about home modification, and DME. Rec. SNF  Level of care upon discharge. Pt. reports concerns about going to SNF because of COVID-19, and limitations on visitors at the SNFs. Pt. Reports Plannning to return home upon discharge with family to assist pt. as needed. Pt. Would benefit form  follow-up OT services upon discharge.     Follow Up Recommendations  SNF    Equipment Recommendations       Recommendations for Other Services  Shower seat    Precautions / Restrictions Precautions Precautions: Fall Restrictions Weight Bearing Restrictions: Yes RLE Weight Bearing: Touchdown weight bearing Other Position/Activity Restrictions: Pt is TDWB with the foot to be placed flat on the floor with ambulation (FFWB) with the weight of the leg only       Mobility Bed Mobility               Transfers Overall transfer  level: Needs assistance     Sit to Stand: Min assist;From elevated surface Stand pivot transfers: Min guard       General transfer comment: Mobility per PT    Balance                                           ADL either performed or assessed with clinical judgement   ADL Overall ADL's : Needs assistance/impaired Eating/Feeding: Independent   Grooming: Independent   Upper Body Bathing: Independent;Bed level   Lower Body Bathing: Maximal assistance;Bed level   Upper Body Dressing : Min guard;Bed level   Lower Body Dressing: Maximal assistance                       Vision Baseline Vision/History: Wears glasses Wears Glasses: At all times Patient Visual Report: No change from baseline     Perception     Praxis      Cognition Arousal/Alertness: Awake/alert Behavior During Therapy: WFL for tasks assessed/performed Overall Cognitive Status: Within Functional Limits for tasks assessed                                          Exercises    Shoulder Instructions       General Comments      Pertinent Vitals/ Pain  Pain Assessment: 0-10 Pain Score: 7  Pain Location: R hip/thigh to knee Pain Descriptors / Indicators: Aching;Constant;Operative site guarding;Sore Pain Intervention(s): Monitored during session;Premedicated before session  Home Living                                          Prior Functioning/Environment              Frequency  Min 2X/week        Progress Toward Goals  OT Goals(current goals can now be found in the care plan section)  Progress towards OT goals: OT to reassess next treatment  Acute Rehab OT Goals Patient Stated Goal: To return with her familiy OT Goal Formulation: With patient Potential to Achieve Goals: Good  Plan Discharge plan remains appropriate;Frequency remains appropriate    Co-evaluation                 AM-PAC OT "6 Clicks" Daily  Activity     Outcome Measure   Help from another person eating meals?: None Help from another person taking care of personal grooming?: None Help from another person toileting, which includes using toliet, bedpan, or urinal?: A Lot Help from another person bathing (including washing, rinsing, drying)?: A Lot Help from another person to put on and taking off regular upper body clothing?: A Little Help from another person to put on and taking off regular lower body clothing?: A Lot 6 Click Score: 17    End of Session    OT Visit Diagnosis: Other abnormalities of gait and mobility (R26.89);Repeated falls (R29.6);Muscle weakness (generalized) (M62.81);Pain Pain - Right/Left: Right Pain - part of body: Hip   Activity Tolerance Patient limited by pain   Patient Left in bed;with call bell/phone within reach;with bed alarm set;with family/visitor present   Nurse Communication          Time: 3790-2409 OT Time Calculation (min): 23 min  Charges: OT General Charges $OT Visit: 1 Visit OT Treatments $Self Care/Home Management : 23-37 mins  Harrel Carina, MS, OTR/L   Harrel Carina, MS, OTR/L 02/03/2019, 4:23 PM

## 2019-02-03 NOTE — Care Management Important Message (Signed)
Important Message  Patient Details  Name: Kimberly Hudson MRN: [id number] Date of Birth: Jan 20, 1956   Medicare Important Message Given:  Yes    Juliann Pulse A Seleni Meller 02/03/2019, 10:23 AM

## 2019-02-03 NOTE — Progress Notes (Signed)
Physical Therapy Treatment Patient Details Name: Kimberly Hudson MRN: [id number] DOB: 02/26/1956 Today's Date: 02/03/2019    History of Present Illness Pt is a 63 yo female with a PMH that includes Crohn's disease, anxiety, CAD, depression, GERD, and HTN presented to the ED after a fall resulting in a R intertrochanteric hip fracture.  Pt is s/p intramedullary nailing of R femur with cephalomedullary device.  MD assessment also includes: weakness, chronic diarrhea, hypochloremia, hyperglycemia, hypocalcemia, transaminasemia, hypoproteinemia/hypoalbuminemia, leukopenia (w/o neutropenia), and macrocytic anemia.     PT Comments    Pt agreeable to PT; reports 7/10 pain in R hip/thigh. Pt had returned to bed from chair with nursing after approximately 1 hour in th chair. Pt does not wish up in the chair this afternoon; agreeable to supine exercises. Pt participates well in supine exercises with improved effort and range throughout; continues to display tense/anxous behavior with exercise, but eases with breathing cues. Continue PT to progress all strength, endurance and functional mobility.    Follow Up Recommendations  SNF     Equipment Recommendations       Recommendations for Other Services       Precautions / Restrictions Precautions Precautions: Fall Restrictions Weight Bearing Restrictions: Yes RLE Weight Bearing: Touchdown weight bearing Other Position/Activity Restrictions: Pt is TDWB with the foot to be placed flat on the floor with ambulation (FFWB) with the weight of the leg only    Mobility  Bed Mobility Overal bed mobility: Needs Assistance Bed Mobility: Supine to Sit     Supine to sit: Min assist     General bed mobility comments: Not tested; pt had returned back to bed with nursing after approxmately 1 hour up in chair  Transfers Overall transfer level: Needs assistance Equipment used: Rolling walker (2 wheeled) Transfers: Sit to/from [employer] Sit to Stand: Min assist;From elevated surface Stand pivot transfers: Min guard       General transfer comment: Pt improved; performed STS 3x for improved confidence with stand; demonstrating good LLE strength and safe use of hands. Good eccentric control with sit to lower chair surface. Educated on reach back initially with LUE to promote weight bearing to L  and protect weight bearing on R  Ambulation/Gait                 Stairs             Wheelchair Mobility    Modified Rankin (Stroke Patients Only)       Balance Overall balance assessment: Needs assistance Sitting-balance support: Feet supported;Bilateral upper extremity supported Sitting balance-Leahy Scale: Good Sitting balance - Comments: takes increased time to attain/feel confident with sitting with equal wt on B hips   Standing balance support: Bilateral upper extremity supported Standing balance-Leahy Scale: Fair                              Cognition Arousal/Alertness: Awake/alert Behavior During Therapy: WFL for tasks assessed/performed Overall Cognitive Status: Within Functional Limits for tasks assessed                                        Exercises General Exercises - Lower Extremity Ankle Circles/Pumps: AROM;Both;20 reps;Seated;Other (comment)(also long sit) Quad Sets: Strengthening;Both;20 reps(long sit) Gluteal Sets: Strengthening;Both;20 reps;Other (comment)(long sit) Short Arc Quad: AROM;Both;20 reps;Supine Long Arc Quad: Strengthening;Right;10 reps;Seated Heel  Slides: AAROM;Right;10 reps;Supine(self assists on R; L AROM with resisted ext; 2 sets each) Hip ABduction/ADduction: AAROM;20 reps;Supine;Other (comment);Both Straight Leg Raises: AAROM;Right;10 reps;Supine(2 sets; Assist to hold R knee in hooklying for LLE lifts) Hip Flexion/Marching: AAROM;Right;10 reps;Seated;Other (comment)(self assist)    General Comments        Pertinent  Vitals/Pain Pain Assessment: 0-10 Pain Score: 7  Pain Location: R hip/thigh to knee Pain Descriptors / Indicators: Aching;Constant;Operative site guarding;Sore Pain Intervention(s): Monitored during session;Premedicated before session;Repositioned    Home Living                      Prior Function            PT Goals (current goals can now be found in the care plan section) Progress towards PT goals: Progressing toward goals    Frequency    BID      PT Plan Current plan remains appropriate    Co-evaluation              AM-PAC PT "6 Clicks" Mobility   Outcome Measure  Help needed turning from your back to your side while in a flat bed without using bedrails?: A Lot Help needed moving from lying on your back to sitting on the side of a flat bed without using bedrails?: A Lot Help needed moving to and from a bed to a chair (including a wheelchair)?: A Little Help needed standing up from a chair using your arms (e.g., wheelchair or bedside chair)?: A Little Help needed to walk in hospital room?: Total Help needed climbing 3-5 steps with a railing? : Total 6 Click Score: 12    End of Session Equipment Utilized During Treatment: Gait belt Activity Tolerance: Patient tolerated treatment well Patient left: in bed;with call bell/phone within reach;with bed alarm set;with family/visitor present;with SCD's reapplied   PT Visit Diagnosis: Unsteadiness on feet (R26.81);History of falling (Z91.81);Other abnormalities of gait and mobility (R26.89);Muscle weakness (generalized) (M62.81)     Time: 1025-8527 PT Time Calculation (min) (ACUTE ONLY): 23 min  Charges:  $Therapeutic Exercise: 23-37 mins $Therapeutic Activity: 8-22 mins                       Larae Grooms, PTA 02/03/2019, 2:35 PM

## 2019-02-03 NOTE — Progress Notes (Signed)
[facility] NAME: Kimberly Hudson    MR#:  [id number]  DATE OF BIRTH:  03/17/1956  SUBJECTIVE:  CHIEF COMPLAINT:   Chief Complaint  Patient presents with  . Fall   Patient without complaint, and discussion with case management/social work-awaiting insurance authorization for placement to skilled nursing facility REVIEW OF SYSTEMS:  CONSTITUTIONAL: No fever, fatigue or weakness.  EYES: No blurred or double vision.  EARS, NOSE, AND THROAT: No tinnitus or ear pain.  RESPIRATORY: No cough, shortness of breath, wheezing or hemoptysis.  CARDIOVASCULAR: No chest pain, orthopnea, edema.  GASTROINTESTINAL: No nausea, vomiting, diarrhea or abdominal pain.  GENITOURINARY: No dysuria, hematuria.  ENDOCRINE: No polyuria, nocturia,  HEMATOLOGY: No anemia, easy bruising or bleeding SKIN: No rash or lesion. MUSCULOSKELETAL: No joint pain or arthritis.   NEUROLOGIC: No tingling, numbness, weakness.  PSYCHIATRY: No anxiety or depression.   ROS  DRUG ALLERGIES:   Allergies  Allergen Reactions  . Nsaids Other (See Comments)    Because of Crohns Because of Crohns  . Sulfa Antibiotics Rash    VITALS:  Blood pressure 104/77, pulse 80, temperature 98.2 F (36.8 C), temperature source Oral, resp. rate 18, height 5' 7"  (1.702 m), weight 88.5 kg, SpO2 95 %.  PHYSICAL EXAMINATION:  GENERAL:  63 y.o.-year-old patient lying in the bed with no acute distress.  EYES: Pupils equal, round, reactive to light and accommodation. No scleral icterus. Extraocular muscles intact.  HEENT: Head atraumatic, normocephalic. Oropharynx and nasopharynx clear.  NECK:  Supple, no jugular venous distention. No thyroid enlargement, no tenderness.  LUNGS: Normal breath sounds bilaterally, no wheezing, rales,rhonchi or crepitation. No use of accessory muscles of respiration.  CARDIOVASCULAR: S1, S2 normal. No murmurs, rubs, or gallops.  ABDOMEN: Soft, nontender,  nondistended. Bowel sounds present. No organomegaly or mass.  EXTREMITIES: No pedal edema, cyanosis, or clubbing.  NEUROLOGIC: Cranial nerves II through XII are intact. Muscle strength 5/5 in all extremities. Sensation intact. Gait not checked.  PSYCHIATRIC: The patient is alert and oriented x 3.  SKIN: No obvious rash, lesion, or ulcer.   Physical Exam LABORATORY PANEL:   CBC Recent Labs  Lab 02/02/19 0656  WBC 3.6*  HGB 7.9*  HCT 25.5*  PLT 226   ------------------------------------------------------------------------------------------------------------------  Chemistries  Recent Labs  Lab 01/27/19 2247  01/30/19 0407 01/31/19 0510  02/01/19 0306  NA 137   < > 142 141  --   --   K <2.0*   < > 3.7 2.9*   < > 4.5  CL 96*   < > 105 104  --   --   CO2 27   < > 30 30  --   --   GLUCOSE 122*   < > 103* 88  --   --   BUN 9   < > 9 9  --   --   CREATININE 0.92   < > 0.83 0.69  --  0.79  CALCIUM 7.6*   < > 7.7* 7.5*  --   --   MG  --    < > 2.1  --   --   --   AST 48*  --   --   --   --   --   ALT 18  --   --   --   --   --   ALKPHOS 154*  --   --   --   --   --  BILITOT 1.0  --   --   --   --   --    < > = values in this interval not displayed.   ------------------------------------------------------------------------------------------------------------------  Cardiac Enzymes No results for input(s): TROPONINI in the last 168 hours. ------------------------------------------------------------------------------------------------------------------  RADIOLOGY:  No results found.  ASSESSMENT AND PLAN:  *Acute right intertrochanteric hip fracture Status post repair Patient to go to rehab once insurance authorizes.  If insurance authorizes today she will go.  Prophylactic Lovenox and pain medications ordered by orthopedic surgery  *Postoperative anemia and chronic anemia Improved status post blood transfusion, given IV Venofer x1, continue B12 supplementation  *Crohn's  disease with chronic diarrhea Stable CT scan shows strictures Patient on Entocort and rifaximin  *Acute hypokalemia/hypophosphatemia/hypomagnesemia Repleted  *Leukopenia Stable  *Chronic hyperlipidemia, unspecified Continue pravastatin  *Chronic vitamin B-12 and D deficiency Stable Continue supplementation  Disposition to skilled nursing facility/inpatient rehab when insurance authorization completed.  All the records are reviewed and case discussed with Care Management/Social Workerr. Management plans discussed with the patient, family and they are in agreement.  CODE STATUS: full  TOTAL TIME TAKING CARE OF THIS PATIENT: 35 minutes.     POSSIBLE D/C IN 1-3 DAYS, DEPENDING ON CLINICAL CONDITION.   Avel Peace Mykala Mccready M.D on 02/03/2019   Between 7am to 6pm - Pager - [phone]  After 6pm go to [email or web address] - password [email or web address] [facility]  Office  [phone]  CC: Primary care physician; Birdie Sons, MD  Note: This dictation was prepared with Dragon dictation along with smaller phrase technology. Any transcriptional errors that result from this process are unintentional.

## 2019-02-03 NOTE — Clinical Social Work Note (Signed)
CSW contacted Otila Kluver at [employer] regarding [employer]. Otila Kluver states that [facility] is still reviewing the case and they have requested more information. CSW faxed requested information to [facility]. CSW will continue to follow for discharge planning.   [place], [place]

## 2019-02-03 NOTE — Progress Notes (Signed)
Physical Therapy Treatment Patient Details Name: Kimberly Hudson MRN: [id number] DOB: Apr 17, 1956 Today's Date: 02/03/2019    History of Present Illness Pt is a 63 yo female with a PMH that includes Crohn's disease, anxiety, CAD, depression, GERD, and HTN presented to the ED after a fall resulting in a R intertrochanteric hip fracture.  Pt is s/p intramedullary nailing of R femur with cephalomedullary device.  MD assessment also includes: weakness, chronic diarrhea, hypochloremia, hyperglycemia, hypocalcemia, transaminasemia, hypoproteinemia/hypoalbuminemia, leukopenia (w/o neutropenia), and macrocytic anemia.     PT Comments    Pt agreeable to PT; reports 7/10 pain in R hip/thigh. Pt offers that she has been performing RLE exercises as instructed last session. Pt agreeable to attempt out of bed to chair today. Requires increased time, A with RLE and heavy reliance on rails to come to edge of bed in sitting. Increased time as well to accept weight bearing through R hip in sit. Pt educated on seated exercises with self assist for R hip flexion. STS from elevated surface performed 3 times with gained confidence and Min A. Pt adheres well to weight bearing status (TTWD/flat foot) with stand and stand pivot transfers; Min guard for stand pivot transfers. Demonstrates good eccentric control to sit to lower chair surface once educated on LUE reach back to chair for promotion of weight bearing to L. Pt encouraged to continue LE exercises while in the chair. Continue PT to progress strength, confidence and tolerance of out of bed to improve all functional mobility.    Follow Up Recommendations  SNF     Equipment Recommendations       Recommendations for Other Services       Precautions / Restrictions Precautions Precautions: Fall Restrictions Weight Bearing Restrictions: Yes RLE Weight Bearing: Touchdown weight bearing Other Position/Activity Restrictions: Pt is TDWB with the foot to be placed flat  on the floor with ambulation (FFWB) with the weight of the leg only    Mobility  Bed Mobility Overal bed mobility: Needs Assistance Bed Mobility: Supine to Sit     Supine to sit: Min assist     General bed mobility comments: Assist for RLE; pt heavily uses rail for trunk. Significant increased time to come to sit with weight bearing on R buttock.   Transfers Overall transfer level: Needs assistance Equipment used: Rolling walker (2 wheeled) Transfers: Sit to/from [facility] Sit to Stand: Min assist;From elevated surface Stand pivot transfers: Min guard       General transfer comment: Pt improved; performed STS 3x for improved confidence with stand; demonstrating good LLE strength and safe use of hands. Good eccentric control with sit to lower chair surface. Educated on reach back initially with LUE to promote weight bearing to L  and protect weight bearing on R  Ambulation/Gait                 Stairs             Wheelchair Mobility    Modified Rankin (Stroke Patients Only)       Balance Overall balance assessment: Needs assistance Sitting-balance support: Feet supported;Bilateral upper extremity supported Sitting balance-Leahy Scale: Good Sitting balance - Comments: takes increased time to attain/feel confident with sitting with equal wt on B hips   Standing balance support: Bilateral upper extremity supported Standing balance-Leahy Scale: Fair  Cognition Arousal/Alertness: Awake/alert Behavior During Therapy: WFL for tasks assessed/performed Overall Cognitive Status: Within Functional Limits for tasks assessed                                        Exercises General Exercises - Lower Extremity Ankle Circles/Pumps: AROM;Both;20 reps;Seated;Other (comment)(also long sit) Quad Sets: Strengthening;Both;20 reps(long sit) Gluteal Sets: Strengthening;Both;20 reps;Other  (comment)(long sit) Long Arc Quad: Strengthening;Right;10 reps;Seated Hip Flexion/Marching: AAROM;Right;10 reps;Seated;Other (comment)(self assist)    General Comments        Pertinent Vitals/Pain Pain Assessment: 0-10 Pain Score: 7  Pain Location: R hip/thigh to knee Pain Descriptors / Indicators: Aching;Constant;Operative site guarding;Sore Pain Intervention(s): Monitored during session;Premedicated before session;Repositioned    Home Living                      Prior Function            PT Goals (current goals can now be found in the care plan section) Progress towards PT goals: Progressing toward goals    Frequency    BID      PT Plan Current plan remains appropriate    Co-evaluation              AM-PAC PT "6 Clicks" Mobility   Outcome Measure  Help needed turning from your back to your side while in a flat bed without using bedrails?: A Lot Help needed moving from lying on your back to sitting on the side of a flat bed without using bedrails?: A Lot Help needed moving to and from a bed to a chair (including a wheelchair)?: A Little Help needed standing up from a chair using your arms (e.g., wheelchair or bedside chair)?: A Little Help needed to walk in hospital room?: Total Help needed climbing 3-5 steps with a railing? : Total 6 Click Score: 12    End of Session Equipment Utilized During Treatment: Gait belt Activity Tolerance: Patient tolerated treatment well Patient left: in chair;with chair alarm set;with call bell/phone within reach   PT Visit Diagnosis: Unsteadiness on feet (R26.81);History of falling (Z91.81);Other abnormalities of gait and mobility (R26.89);Muscle weakness (generalized) (M62.81)     Time: 1130-1200 PT Time Calculation (min) (ACUTE ONLY): 30 min  Charges:  $Therapeutic Exercise: 8-22 mins $Therapeutic Activity: 8-22 mins                      Larae Grooms, PTA 02/03/2019, 12:31 PM

## 2019-02-04 LAB — THIOPURINE METHYLTRANSFERASE (TPMT), RBC: TPMT Activity:: 20.9 Units/mL RBC

## 2019-02-04 NOTE — Progress Notes (Addendum)
[facility] [place] NAME: Kimberly Hudson    MR#:  [id number]  DATE OF BIRTH:  04-16-1956  SUBJECTIVE:  CHIEF COMPLAINT:   Chief Complaint  Patient presents with  . Fall   Patient without complaint, d/w CM/SW-insurance authorization did not go through, awaiting peer-to-peer review REVIEW OF SYSTEMS:  CONSTITUTIONAL: No fever, fatigue or weakness.  EYES: No blurred or double vision.  EARS, NOSE, AND THROAT: No tinnitus or ear pain.  RESPIRATORY: No cough, shortness of breath, wheezing or hemoptysis.  CARDIOVASCULAR: No chest pain, orthopnea, edema.  GASTROINTESTINAL: No nausea, vomiting, diarrhea or abdominal pain.  GENITOURINARY: No dysuria, hematuria.  ENDOCRINE: No polyuria, nocturia,  HEMATOLOGY: No anemia, easy bruising or bleeding SKIN: No rash or lesion. MUSCULOSKELETAL: No joint pain or arthritis.   NEUROLOGIC: No tingling, numbness, weakness.  PSYCHIATRY: No anxiety or depression.   ROS  DRUG ALLERGIES:   Allergies  Allergen Reactions  . Nsaids Other (See Comments)    Because of Crohns Because of Crohns  . Sulfa Antibiotics Rash    VITALS:  Blood pressure 117/70, pulse 79, temperature 98.2 F (36.8 C), temperature source Oral, resp. rate 17, height 5' 7"  (1.702 m), weight 88.5 kg, SpO2 99 %.  PHYSICAL EXAMINATION:  GENERAL:  63 y.o.-year-old patient lying in the bed with no acute distress.  EYES: Pupils equal, round, reactive to light and accommodation. No scleral icterus. Extraocular muscles intact.  HEENT: Head atraumatic, normocephalic. Oropharynx and nasopharynx clear.  NECK:  Supple, no jugular venous distention. No thyroid enlargement, no tenderness.  LUNGS: Normal breath sounds bilaterally, no wheezing, rales,rhonchi or crepitation. No use of accessory muscles of respiration.  CARDIOVASCULAR: S1, S2 normal. No murmurs, rubs, or gallops.  ABDOMEN: Soft, nontender, nondistended. Bowel sounds present. No  organomegaly or mass.  EXTREMITIES: No pedal edema, cyanosis, or clubbing.  NEUROLOGIC: Cranial nerves II through XII are intact. Muscle strength 5/5 in all extremities. Sensation intact. Gait not checked.  PSYCHIATRIC: The patient is alert and oriented x 3.  SKIN: No obvious rash, lesion, or ulcer.   Physical Exam LABORATORY PANEL:   CBC Recent Labs  Lab 02/02/19 0656  WBC 3.6*  HGB 7.9*  HCT 25.5*  PLT 226   ------------------------------------------------------------------------------------------------------------------  Chemistries  Recent Labs  Lab 01/30/19 0407 01/31/19 0510  02/01/19 0306  NA 142 141  --   --   K 3.7 2.9*   < > 4.5  CL 105 104  --   --   CO2 30 30  --   --   GLUCOSE 103* 88  --   --   BUN 9 9  --   --   CREATININE 0.83 0.69  --  0.79  CALCIUM 7.7* 7.5*  --   --   MG 2.1  --   --   --    < > = values in this interval not displayed.   ------------------------------------------------------------------------------------------------------------------  Cardiac Enzymes No results for input(s): TROPONINI in the last 168 hours. ------------------------------------------------------------------------------------------------------------------  RADIOLOGY:  No results found.  ASSESSMENT AND PLAN:  *Acute right intertrochanteric hip fracture Status post repair Patient to go to rehab once insurance authorizes.  Peer to peer evaluation currently in [phone], awaiting callback.  Prophylactic Lovenox and pain medications ordered by orthopedic surgery  *Postoperative anemia and chronic anemia Improved status post blood transfusion, given IV Venofer x1, continue B12 supplementation  *Crohn's disease with chronic diarrhea Stable CT scan shows strictures Patient on Entocort and rifaximin  *  Acute hypokalemia/hypophosphatemia/hypomagnesemia Repleted  *Leukopenia Stable  *Chronic hyperlipidemia, unspecified Continue pravastatin  *Chronic  vitamin B-12 and D deficiency Stable Continue supplementation  Disposition to skilled nursing facility/inpatient rehab versus home with home health services, per social work/case management-was denied by insurance, awaiting peer-to-peer callback at this time per above.    All the records are reviewed and case discussed with Care Management/Social Workerr. Management plans discussed with the patient, family and they are in agreement.  CODE STATUS: full  TOTAL TIME TAKING CARE OF THIS PATIENT: 35 minutes.   POSSIBLE D/C IN 1-2 DAYS, DEPENDING ON CLINICAL CONDITION.   Avel Peace Salary M.D on 02/04/2019   Between 7am to 6pm - Pager - [phone]  After 6pm go to [email or web address] - password [email or web address] [facility]  Office  [phone]  CC: Primary care physician; Birdie Sons, MD  Note: This dictation was prepared with Dragon dictation along with smaller phrase technology. Any transcriptional errors that result from this process are unintentional.

## 2019-02-04 NOTE — Progress Notes (Signed)
Physical Therapy Treatment Patient Details Name: Kimberly Hudson MRN: [id number] DOB: Oct 29, 1956 Today's Date: 02/04/2019    History of Present Illness Pt is a 63 yo female with a PMH that includes Crohn's disease, anxiety, CAD, depression, GERD, and HTN presented to the ED after a fall resulting in a R intertrochanteric hip fracture.  Pt is s/p intramedullary nailing of R femur with cephalomedullary device.  MD assessment also includes: weakness, chronic diarrhea, hypochloremia, hyperglycemia, hypocalcemia, transaminasemia, hypoproteinemia/hypoalbuminemia, leukopenia (w/o neutropenia), and macrocytic anemia.     PT Comments    Pt agreeable to PT; pain continues 5-7/10 rest to activity respectively. Pt continues to require significant time to complete bed mobility, but can ultimately do with Mod I with cues and encouragement. Effortful and slow due to pain/anxious pain response. Pt educated on self assist and improved technique by shortening leg lever with HS. Pt performs exercises seated and educated in self gentle massage/sweeps to quad muscle for tension/pain control. Transfers with cues and Min A and Min guard for pivot transfers. Continue PT to progress strength and endurance and all functional mobility. Of note, pt becomes tearful near sessions end, as she does not wish to go to a skilled nursing facility due to fear of COVID-19; educated pt on measures facilities are going to to prevent spread of infection, listened and offered support; pt did not wish to speak with Chaplain/SW.    Follow Up Recommendations  SNF     Equipment Recommendations       Recommendations for Other Services       Precautions / Restrictions Precautions Precautions: Fall Restrictions Weight Bearing Restrictions: Yes RLE Weight Bearing: Touchdown weight bearing Other Position/Activity Restrictions: Pt is TDWB with the foot to be placed flat on the floor with ambulation (FFWB) with the weight of the leg only     Mobility  Bed Mobility Overal bed mobility: Modified Independent Bed Mobility: Supine to Sit     Supine to sit: Modified independent (Device/Increase time)     General bed mobility comments: Does take a significant amount of time; education on self assist with other leg and also performing HS first to shorten lever. Pt ultimately performs with Mod I, but quite effortful  Transfers Overall transfer level: Needs assistance Equipment used: Rolling walker (2 wheeled) Transfers: Sit to/from [facility] Sit to Stand: Min assist;From elevated surface Stand pivot transfers: Min guard       General transfer comment: Cues for hand placement to stand and gentle reminder for L reach back to sit. Overall improved   Ambulation/Gait                 Stairs             Wheelchair Mobility    Modified Rankin (Stroke Patients Only)       Balance Overall balance assessment: Needs assistance Sitting-balance support: Feet supported;Bilateral upper extremity supported Sitting balance-Leahy Scale: Good Sitting balance - Comments: takes increased time to attain/feel confident with sitting with equal wt on B hips   Standing balance support: Bilateral upper extremity supported Standing balance-Leahy Scale: Fair                              Cognition Arousal/Alertness: Awake/alert Behavior During Therapy: WFL for tasks assessed/performed Overall Cognitive Status: Within Functional Limits for tasks assessed  Exercises General Exercises - Lower Extremity Ankle Circles/Pumps: AROM;Both;20 reps;Seated;Other (comment) Long Arc Quad: Strengthening;Right;10 reps;Seated(2 sets) Heel Slides: AAROM;Right;10 reps;Supine Hip Flexion/Marching: AAROM;Right;10 reps;Seated;Other (comment) Other Exercises Other Exercises: education on bed mobility and self A with RLE     General Comments         Pertinent Vitals/Pain Pain Assessment: 0-10 Pain Score: 5 (at rest; 7 with movement) Pain Location: R hip/thigh to knee Pain Descriptors / Indicators: Aching;Constant;Operative site guarding;Sore    Home Living                      Prior Function            PT Goals (current goals can now be found in the care plan section) Progress towards PT goals: Progressing toward goals    Frequency    BID      PT Plan Current plan remains appropriate    Co-evaluation              AM-PAC PT "6 Clicks" Mobility   Outcome Measure  Help needed turning from your back to your side while in a flat bed without using bedrails?: A Lot Help needed moving from lying on your back to sitting on the side of a flat bed without using bedrails?: A Lot Help needed moving to and from a bed to a chair (including a wheelchair)?: A Little Help needed standing up from a chair using your arms (e.g., wheelchair or bedside chair)?: A Little Help needed to walk in hospital room?: Total Help needed climbing 3-5 steps with a railing? : Total 6 Click Score: 12    End of Session Equipment Utilized During Treatment: Gait belt Activity Tolerance: Patient tolerated treatment well Patient left: in chair;with call bell/phone within reach;with chair alarm set   PT Visit Diagnosis: Unsteadiness on feet (R26.81);History of falling (Z91.81);Other abnormalities of gait and mobility (R26.89);Muscle weakness (generalized) (M62.81)     Time: 9276-3943 PT Time Calculation (min) (ACUTE ONLY): 41 min  Charges:  $Therapeutic Exercise: 8-22 mins $Therapeutic Activity: 23-37 mins                      Larae Grooms, PTA 02/04/2019, 11:48 AM

## 2019-02-04 NOTE — Progress Notes (Signed)
Physical Therapy Treatment Patient Details Name: Kimberly Hudson MRN: [id number] DOB: [birth year] Today's Date: 02/04/2019    History of Present Illness Pt is a 63 yo female with a PMH that includes Crohn's disease, anxiety, CAD, depression, GERD, and HTN presented to the ED after a fall resulting in a R intertrochanteric hip fracture.  Pt is s/p intramedullary nailing of R femur with cephalomedullary device.  MD assessment also includes: weakness, chronic diarrhea, hypochloremia, hyperglycemia, hypocalcemia, transaminasemia, hypoproteinemia/hypoalbuminemia, leukopenia (w/o neutropenia), and macrocytic anemia.     PT Comments    Pt agreeable to PT; continues with pain 5 at rest once calms from movement and 7+ with movement. Pt continues to require assist and cues for all mobility with increased time to complete transfers, bed mobility and exercises with RLE. Min A overall with transfers and return to bed. Pt refuses improved positioning with RLE in the bed with use of pillows; prefers fairly flat folded blanket under calf only. Continues to guard RLE strongly with all movement, but does demonstrate improved hip and knee flexion with self assist using towel. Reviewed briefly use of self gentle sweeps/massage of R quad moving toward trunk/heart. Pt understands. Continue PT to progress strength, endurance and tolerance/safety of activity. Continue to recommend skilled nursing facility post discharge for optimal safety and healing, as pt is unable to walk; despite this, pt wishes to go home versus a facility.    Follow Up Recommendations  SNF     Equipment Recommendations       Recommendations for Other Services       Precautions / Restrictions Precautions Precautions: Fall Restrictions Weight Bearing Restrictions: Yes RLE Weight Bearing: Touchdown weight bearing Other Position/Activity Restrictions: Pt is TDWB with the foot to be placed flat on the floor with ambulation (FFWB) with the weight  of the leg only    Mobility  Bed Mobility Overal bed mobility: Needs Assistance Bed Mobility: Sit to Supine     Supine to sit: Min assist     General bed mobility comments: Assist for BLEs to return to bed  Transfers Overall transfer level: Needs assistance Equipment used: Rolling walker (2 wheeled) Transfers: Sit to/from [facility] Sit to Stand: Min assist;From elevated surface Stand pivot transfers: Min guard       General transfer comment: Cues for hands   Ambulation/Gait                 Stairs             Wheelchair Mobility    Modified Rankin (Stroke Patients Only)       Balance Overall balance assessment: Needs assistance Sitting-balance support: Feet supported;Bilateral upper extremity supported Sitting balance-Leahy Scale: Good Sitting balance - Comments: takes increased time to attain/feel confident with sitting with equal wt on B hips   Standing balance support: Bilateral upper extremity supported Standing balance-Leahy Scale: Fair                              Cognition Arousal/Alertness: Awake/alert Behavior During Therapy: WFL for tasks assessed/performed Overall Cognitive Status: Within Functional Limits for tasks assessed                                        Exercises General Exercises - Lower Extremity Ankle Circles/Pumps: AROM;Both;20 reps;Seated;Other (comment) Quad Sets: Strengthening;Both;20 reps Gluteal Sets: Strengthening;Both;20  reps Long Arc Quad: Strengthening;Right;10 reps;Seated(2 sets) Heel Slides: AAROM;Right;20 reps;Supine;Other (comment)(self assisted with towel) Hip ABduction/ADduction: AAROM;Right;20 reps;Supine Straight Leg Raises: AAROM;Right;10 reps;Supine Hip Flexion/Marching: AAROM;Right;10 reps;Seated;Other (comment) Other Exercises Other Exercises: education on bed mobility and self A with RLE     General Comments        Pertinent Vitals/Pain Pain  Assessment: 0-10 Pain Score: 5 (At rest; 7+ with movement) Pain Location: R hip/thigh to knee Pain Descriptors / Indicators: Aching;Constant;Operative site guarding;Sore Pain Intervention(s): Monitored during session    Home Living                      Prior Function            PT Goals (current goals can now be found in the care plan section) Progress towards PT goals: Progressing toward goals    Frequency    BID      PT Plan Current plan remains appropriate    Co-evaluation              AM-PAC PT "6 Clicks" Mobility   Outcome Measure  Help needed turning from your back to your side while in a flat bed without using bedrails?: A Lot Help needed moving from lying on your back to sitting on the side of a flat bed without using bedrails?: A Lot Help needed moving to and from a bed to a chair (including a wheelchair)?: A Little Help needed standing up from a chair using your arms (e.g., wheelchair or bedside chair)?: A Little Help needed to walk in hospital room?: Total Help needed climbing 3-5 steps with a railing? : Total 6 Click Score: 12    End of Session Equipment Utilized During Treatment: Gait belt Activity Tolerance: Patient tolerated treatment well Patient left: in bed;with call bell/phone within reach;with bed alarm set   PT Visit Diagnosis: Unsteadiness on feet (R26.81);History of falling (Z91.81);Other abnormalities of gait and mobility (R26.89);Muscle weakness (generalized) (M62.81)     Time: 6834-1962 PT Time Calculation (min) (ACUTE ONLY): 32 min  Charges:  $Therapeutic Exercise: 8-22 mins $Therapeutic Activity: 8-22 mins                      Larae Grooms, PTA 02/04/2019, 3:02 PM

## 2019-02-04 NOTE — Clinical Social Work Note (Signed)
CSW received a phone call from [place] at [facility] regarding patient. Tammy states that insurance will deny SNF coverage for patient and a peer to peer review needs to be done. CSW notified MD of need for peer to peer review and provided phone number.   [place], [place]

## 2019-02-04 NOTE — Plan of Care (Signed)
  Problem: Education: Goal: Knowledge of General Education information will improve Description Including pain rating scale, medication(s)/side effects and non-pharmacologic comfort measures Outcome: Progressing   Problem: Clinical Measurements: Goal: Ability to maintain clinical measurements within normal limits will improve Outcome: Progressing Goal: Will remain free from infection Outcome: Progressing   Problem: Nutrition: Goal: Adequate nutrition will be maintained Outcome: Progressing   Problem: Coping: Goal: Level of anxiety will decrease Outcome: Progressing   Problem: Safety: Goal: Ability to remain free from injury will improve Outcome: Progressing

## 2019-02-05 LAB — CBC
HCT: 28.2 % — ABNORMAL LOW (ref 36.0–46.0)
HEMOGLOBIN: 8.5 g/dL — AB (ref 12.0–15.0)
MCH: 36.6 pg — ABNORMAL HIGH (ref 26.0–34.0)
MCHC: 30.1 g/dL (ref 30.0–36.0)
MCV: 121.6 fL — AB (ref 80.0–100.0)
Platelets: 384 10*3/uL (ref 150–400)
RBC: 2.32 MIL/uL — ABNORMAL LOW (ref 3.87–5.11)
RDW: 22.9 % — ABNORMAL HIGH (ref 11.5–15.5)
WBC: 5 10*3/uL (ref 4.0–10.5)
nRBC: 0 % (ref 0.0–0.2)

## 2019-02-05 LAB — CREATININE, SERUM
Creatinine, Ser: 0.85 mg/dL (ref 0.44–1.00)
GFR calc Af Amer: >60 mL/min (ref >60)
GFR calc non Af Amer: >60 mL/min (ref >60)

## 2019-02-05 NOTE — Progress Notes (Signed)
Occupational Therapy Treatment Patient Details Name: Kimberly Hudson MRN: [id number] DOB: February 14, 1956 Today's Date: 02/05/2019    History of present illness Pt is a 63 yo female with a PMH that includes Crohn's disease, anxiety, CAD, depression, GERD, and HTN presented to the ED after a fall resulting in a R intertrochanteric hip fracture.  Pt is s/p intramedullary nailing of R femur with cephalomedullary device.  MD assessment also includes: weakness, chronic diarrhea, hypochloremia, hyperglycemia, hypocalcemia, transaminasemia, hypoproteinemia/hypoalbuminemia, leukopenia (w/o neutropenia), and macrocytic anemia.    OT comments  Pt. education was provided to the pt. about DME, and A/E. Pt.'s questions were answered about the [employer]. Pt.'s daughter bought a shower seat from the hospice store. Pt. Continues to require extensive assist with LE ADLs. Pt. Continues to benefit from OT services for ADL training, A/E training, and pt. education about home modification, and DME. Pt. plans to go to SNF level of care upon discharge, and would benefit from follow-up OT services upon discharge.    Follow Up Recommendations  SNF    Equipment Recommendations       Recommendations for Other Services      Precautions / Restrictions Precautions Precautions: Fall Restrictions Weight Bearing Restrictions: Yes RLE Weight Bearing: Touchdown weight bearing Other Position/Activity Restrictions: Pt is TDWB with the foot to be placed flat on the floor with ambulation (FFWB) with the weight of the leg only       Mobility Bed Mobility Overal bed mobility: Needs Assistance Bed Mobility: Sit to Supine     Supine to sit: Min assist     General bed mobility comments: Pt able to do some of the transition to EOB, but did need assist to attain upright  Transfers Overall transfer level: Needs assistance Equipment used: Rolling walker (2 wheeled) Transfers: Sit to/from Stand Sit to Stand: Min assist;From  elevated surface         General transfer comment: Mobility per PT.    Balance                               ADL either performed or assessed with clinical judgement   ADL Overall ADL's : Needs assistance/impaired Eating/Feeding: Independent   Grooming: Independent   Upper Body Bathing: Independent;Bed level   Lower Body Bathing: Maximal assistance;Bed level   Upper Body Dressing : Min guard;Bed level   Lower Body Dressing: Maximal assistance                 General ADL Comments: Pt. edcuation was provided about DME     Vision       Perception     Praxis      Cognition Arousal/Alertness: Awake/alert Behavior During Therapy: WFL for tasks assessed/performed Overall Cognitive Status: Within Functional Limits for tasks assessed                                          Exercises   Shoulder Instructions       General Comments      Pertinent Vitals/ Pain       Pain Assessment: 0-10 Pain Score: 5  Faces Pain Scale: Hurts even more Pain Descriptors / Indicators: Aching Pain Intervention(s): Monitored during session  Home Living  Prior Functioning/Environment              Frequency  Min 2X/week        Progress Toward Goals  OT Goals(current goals can now be found in the care plan section)  Progress towards OT goals: Progressing toward goals  Acute Rehab OT Goals Patient Stated Goal: To return with her familiy OT Goal Formulation: With patient Potential to Achieve Goals: Good  Plan      Co-evaluation                 AM-PAC OT "6 Clicks" Daily Activity     Outcome Measure   Help from another person eating meals?: None Help from another person taking care of personal grooming?: None Help from another person toileting, which includes using toliet, bedpan, or urinal?: A Lot Help from another person bathing (including washing, rinsing,  drying)?: A Lot Help from another person to put on and taking off regular upper body clothing?: A Little Help from another person to put on and taking off regular lower body clothing?: A Lot 6 Click Score: 17    End of Session    OT Visit Diagnosis: Other abnormalities of gait and mobility (R26.89);Repeated falls (R29.6);Muscle weakness (generalized) (M62.81);Pain Pain - Right/Left: Right Pain - part of body: Hip   Activity Tolerance Patient limited by pain   Patient Left in bed;with call bell/phone within reach;with bed alarm set;with family/visitor present   Nurse Communication          Time: 4656-8127 OT Time Calculation (min): 8 min  Charges: OT General Charges $OT Visit: 1 Visit OT Treatments $Self Care/Home Management : 8-22 mins  Harrel Carina, MS, OTR/L   Harrel Carina 02/05/2019, 3:16 PM

## 2019-02-05 NOTE — Progress Notes (Signed)
Physical Therapy Treatment Patient Details Name: Kimberly Hudson MRN: [id number] DOB: 06-Dec-1955 Today's Date: 02/05/2019    History of Present Illness Pt is a 63 yo female with a PMH that includes Crohn's disease, anxiety, CAD, depression, GERD, and HTN presented to the ED after a fall resulting in a R intertrochanteric hip fracture.  Pt is s/p intramedullary nailing of R femur with cephalomedullary device.  MD assessment also includes: weakness, chronic diarrhea, hypochloremia, hyperglycemia, hypocalcemia, transaminasemia, hypoproteinemia/hypoalbuminemia, leukopenia (w/o neutropenia), and macrocytic anemia.     PT Comments    Pt showed great effort with PT session despite pain and some anxiety.  She was able to do some exercises with light resistance (no WBing pressure) but still needed AAROM with most acts.  She had her farthest bout of ambulation yet (~10 ft) but did not appropriately use R TDWB and essentially stayed [place] and small hop with L (at times pt keeping R LE in front and up, at times behind her, bet essentially never where/how PT instructed her).  Pt is making slow gains, would clearly struggle at home even with assist and is still appropriate for rehab to be able to work back toward a more independent level.   Follow Up Recommendations  SNF     Equipment Recommendations  Other (comment)(TBD at rehab)    Recommendations for Other Services       Precautions / Restrictions Precautions Precautions: Fall Restrictions Weight Bearing Restrictions: Yes RLE Weight Bearing: Touchdown weight bearing    Mobility  Bed Mobility Overal bed mobility: Needs Assistance Bed Mobility: Sit to Supine     Supine to sit: Min assist     General bed mobility comments: Pt able to do some of the transition to EOB, but did need assist to attain upright  Transfers Overall transfer level: Needs assistance Equipment used: Rolling walker (2 wheeled) Transfers: Sit to/from Stand Sit to  Stand: Min assist;From elevated surface         General transfer comment: Pt needed extra cuing and assist to shift weight forward and maintain balance  Ambulation/Gait Ambulation/Gait assistance: Min assist Gait Distance (Feet): 10 Feet Assistive device: Rolling walker (2 wheeled)       General Gait Details: Pt essentially resorted to [place] "ambulation" most of the time despite cuing that she was allowed to place foot on floor.  She showed decent stability with heavy UE use (did drop walker 1" to allow for nearly straight elbows for ease in [place]).  Pt showed great effort but becuase of WBing status and pain in generally her safety was tenuous. despite no overt LOBs   Marine scientist Rankin (Stroke Patients Only)       Balance Overall balance assessment: Needs assistance   Sitting balance-Leahy Scale: Good     Standing balance support: Bilateral upper extremity supported Standing balance-Leahy Scale: Fair Standing balance comment: highly reliant on walker, able to keep weight off R, but often in an inefficient/uncoordinated way despite cuing                            Cognition Arousal/Alertness: Awake/alert Behavior During Therapy: WFL for tasks assessed/performed Overall Cognitive Status: Within Functional Limits for tasks assessed  Exercises Total Joint Exercises Ankle Circles/Pumps: AROM;10 reps Quad Sets: Strengthening;10 reps Short Arc Quad: AROM;10 reps Heel Slides: AAROM;10 reps Hip ABduction/ADduction: Strengthening;AROM;10 reps Straight Leg Raises: AAROM;5 reps    General Comments        Pertinent Vitals/Pain Pain Assessment: 0-10 Faces Pain Scale: Hurts even more    Home Living                      Prior Function            PT Goals (current goals can now be found in the care plan section) Progress towards PT goals:  Progressing toward goals    Frequency    BID      PT Plan Current plan remains appropriate    Co-evaluation              AM-PAC PT "6 Clicks" Mobility   Outcome Measure  Help needed turning from your back to your side while in a flat bed without using bedrails?: A Little Help needed moving from lying on your back to sitting on the side of a flat bed without using bedrails?: A Little Help needed moving to and from a bed to a chair (including a wheelchair)?: A Little Help needed standing up from a chair using your arms (e.g., wheelchair or bedside chair)?: A Little Help needed to walk in hospital room?: A Lot Help needed climbing 3-5 steps with a railing? : Total 6 Click Score: 15    End of Session Equipment Utilized During Treatment: Gait belt Activity Tolerance: Patient limited by pain;Patient tolerated treatment well Patient left: with chair alarm set;with call bell/phone within reach Nurse Communication: Mobility status PT Visit Diagnosis: Unsteadiness on feet (R26.81);History of falling (Z91.81);Other abnormalities of gait and mobility (R26.89);Muscle weakness (generalized) (M62.81)     Time: 0034-9611 PT Time Calculation (min) (ACUTE ONLY): 41 min  Charges:  $Gait Training: 8-22 mins $Therapeutic Exercise: 8-22 mins $Therapeutic Activity: 8-22 mins                     Kreg Shropshire, DPT 02/05/2019, 2:29 PM

## 2019-02-05 NOTE — Progress Notes (Signed)
[facility] [place] NAME: Kimberly Hudson    MR#:  [id number]  DATE OF BIRTH:  05/01/1956  SUBJECTIVE:  CHIEF COMPLAINT:   Chief Complaint  Patient presents with  . Fall   Patient without complaint, d/w CM/SW-insurance authorization did not go through, peer-to-peer consult on-call [phone] on yesterday and this morning, no callback given as yet, number was called once again-Per secondary to Dr. Jerene Pitch had closed case stating that she tried to contact us yesterday and today-there is no evidence of this as my personal cell phone number was given, we have asked to speak to the supervising staff member to go over this case for reconsideration as well as to report Dr. Jerene Pitch for not following through in this regard/delay in care REVIEW OF SYSTEMS:  CONSTITUTIONAL: No fever, fatigue or weakness.  EYES: No blurred or double vision.  EARS, NOSE, AND THROAT: No tinnitus or ear pain.  RESPIRATORY: No cough, shortness of breath, wheezing or hemoptysis.  CARDIOVASCULAR: No chest pain, orthopnea, edema.  GASTROINTESTINAL: No nausea, vomiting, diarrhea or abdominal pain.  GENITOURINARY: No dysuria, hematuria.  ENDOCRINE: No polyuria, nocturia,  HEMATOLOGY: No anemia, easy bruising or bleeding SKIN: No rash or lesion. MUSCULOSKELETAL: No joint pain or arthritis.   NEUROLOGIC: No tingling, numbness, weakness.  PSYCHIATRY: No anxiety or depression.   ROS  DRUG ALLERGIES:   Allergies  Allergen Reactions  . Nsaids Other (See Comments)    Because of Crohns Because of Crohns  . Sulfa Antibiotics Rash    VITALS:  Blood pressure 110/60, pulse 78, temperature 98.7 F (37.1 C), temperature source Oral, resp. rate 18, height 5' 7"  (1.702 m), weight 88.5 kg, SpO2 100 %.  PHYSICAL EXAMINATION:  GENERAL:  63 y.o.-year-old patient lying in the bed with no acute distress.  EYES: Pupils equal, round, reactive to light and accommodation. No scleral icterus.  Extraocular muscles intact.  HEENT: Head atraumatic, normocephalic. Oropharynx and nasopharynx clear.  NECK:  Supple, no jugular venous distention. No thyroid enlargement, no tenderness.  LUNGS: Normal breath sounds bilaterally, no wheezing, rales,rhonchi or crepitation. No use of accessory muscles of respiration.  CARDIOVASCULAR: S1, S2 normal. No murmurs, rubs, or gallops.  ABDOMEN: Soft, nontender, nondistended. Bowel sounds present. No organomegaly or mass.  EXTREMITIES: No pedal edema, cyanosis, or clubbing.  NEUROLOGIC: Cranial nerves II through XII are intact. Muscle strength 5/5 in all extremities. Sensation intact. Gait not checked.  PSYCHIATRIC: The patient is alert and oriented x 3.  SKIN: No obvious rash, lesion, or ulcer.   Physical Exam LABORATORY PANEL:   CBC Recent Labs  Lab 02/05/19 0441  WBC 5.0  HGB 8.5*  HCT 28.2*  PLT 384   ------------------------------------------------------------------------------------------------------------------  Chemistries  Recent Labs  Lab 01/30/19 0407 01/31/19 0510  02/01/19 0306 02/05/19 0441  NA 142 141  --   --   --   K 3.7 2.9*   < > 4.5  --   CL 105 104  --   --   --   CO2 30 30  --   --   --   GLUCOSE 103* 88  --   --   --   BUN 9 9  --   --   --   CREATININE 0.83 0.69  --  0.79 0.85  CALCIUM 7.7* 7.5*  --   --   --   MG 2.1  --   --   --   --    < > =  values in this interval not displayed.   ------------------------------------------------------------------------------------------------------------------  Cardiac Enzymes No results for input(s): TROPONINI in the last 168 hours. ------------------------------------------------------------------------------------------------------------------  RADIOLOGY:  No results found.  ASSESSMENT AND PLAN:  *Acute right intertrochanteric hip fracture Status post repair Inpatient rehab authorization denied by insurance, d/w CM/SW regarding insurance denial, peer-to-peer  consult line called [phone] on yesterday and this morning, no callback done as yet, number was called once again for a second time this morning-Per secretary - a Dr. Jerene Pitch had closed the case stating that she tried to contact us yesterday and today-there is no evidence of this as my personal cell phone number was given, we have asked to speak the supervising staff member to go over this case for reconsideration as well as to report Dr. Jerene Pitch for not following through in this regard/delay in care.  The secretary also gave [place] a case #[id number], another number to call in case of a second appeal process [phone] in case discussion with supervisor is non-fruitful. Prophylactic Lovenox and pain medications ordered by orthopedic surgery Physical therapy still recommending inpatient rehab as of this morning  *Postoperative anemia and chronic anemia Improved status post blood transfusion, given IV Venofer x1, continue B12 supplementation  *Crohn's disease with chronic diarrhea Stable CT scan shows strictures Patient on Entocort and rifaximin  *Acute hypokalemia/hypophosphatemia/hypomagnesemia Repleted  *Leukopenia Stable  *Chronic hyperlipidemia, unspecified Continue pravastatin  *Chronic vitamin B-12 and D deficiency Stable Continue supplementation  Disposition to skilled nursing facility/inpatient rehab versus home with home health services, per social work/case management-was denied by insurance, awaiting peer-to-peer callback at this time per above.    All the records are reviewed and case discussed with Care Management/Social Workerr. Management plans discussed with the patient, family and they are in agreement.  CODE STATUS: full  TOTAL TIME TAKING CARE OF THIS PATIENT: 35 minutes.   POSSIBLE D/C IN 1-2 DAYS, DEPENDING ON CLINICAL CONDITION.   Avel Peace Danilo Cappiello M.D on 02/05/2019   Between 7am to 6pm - Pager - [phone]  After 6pm go to [email or web address] - password  [facility]  Office  [phone]  CC: Primary care physician; Birdie Sons, MD  Note: This dictation was prepared with Dragon dictation along with smaller phrase technology. Any transcriptional errors that result from this process are unintentional.

## 2019-02-05 NOTE — Progress Notes (Signed)
Physical Therapy Treatment Patient Details Name: Kimberly Hudson MRN: [id number] DOB: 1956-11-12 Today's Date: 02/05/2019    History of Present Illness 63 yo female with a PMH that includes Crohn's disease, anxiety, CAD, depression, GERD, and HTN presented to the ED after a fall resulting in a R intertrochanteric hip fracture.  Pt is s/p intramedullary nailing of R femur with cephalomedullary device.  MD assessment also includes: weakness, chronic diarrhea, hypochloremia, hyperglycemia, hypocalcemia, transaminasemia, hypoproteinemia/hypoalbuminemia, leukopenia (w/o neutropenia), and macrocytic anemia.     PT Comments    Pt continues to struggle significantly with appropriate ambulation technique (essentially maintaining full NWBing on R with inconsistent posturing (leg trailing, leg swinging ahead, knee flexed/straight/etc).  She managed a few "appropriate" steps but remained uncoordinated and unable to adapt despite consistent cuing.  Pt showed improved tolerance with supine exercises, but again quality of motion/coordination remain inconsistent.  Follow Up Recommendations  SNF     Equipment Recommendations  Other (comment)(TBD at rehab)    Recommendations for Other Services       Precautions / Restrictions Precautions Precautions: Fall Restrictions Weight Bearing Restrictions: Yes RLE Weight Bearing: Touchdown weight bearing Other Position/Activity Restrictions: Pt is TDWB with the foot to be placed flat on the floor with ambulation (FFWB) with the weight of the leg only    Mobility  Bed Mobility Overal bed mobility: Needs Assistance Bed Mobility: Sit to Supine     Supine to sit: Min assist Sit to supine: Mod assist   General bed mobility comments: Pt needs assist to get LEs up in to bed, but did show good effort in trying to use L LE to help R, unable  Transfers Overall transfer level: Needs assistance Equipment used: Rolling walker (2 wheeled) Transfers: Sit to/from  Stand Sit to Stand: Min guard         General transfer comment: Pt was able to rise from standard height recliner X 2 during session.  She was highly reliant on UEs, but did manange to rise w/o physical assist  Ambulation/Gait Ambulation/Gait assistance: Min Web designer (Feet): 15 Feet Assistive device: Rolling walker (2 wheeled)       General Gait Details: Pt able to take a few intermittent steps with R foot touch down but ultimately struggled to do anything but swing R LE through despite heavy cuing.   Stairs             Wheelchair Mobility    Modified Rankin (Stroke Patients Only)       Balance Overall balance assessment: Needs assistance Sitting-balance support: Feet supported;Bilateral upper extremity supported Sitting balance-Leahy Scale: Good     Standing balance support: Bilateral upper extremity supported Standing balance-Leahy Scale: Fair Standing balance comment: highly reliant on walker, able to keep weight off R, but often in an inefficient/uncoordinated way despite cuing                            Cognition Arousal/Alertness: Awake/alert Behavior During Therapy: WFL for tasks assessed/performed Overall Cognitive Status: Within Functional Limits for tasks assessed                                        Exercises Total Joint Exercises Ankle Circles/Pumps: AROM;10 reps Quad Sets: Strengthening;10 reps Short Arc Quad: 10 reps;AROM Heel Slides: AAROM;10 reps Hip ABduction/ADduction: Strengthening;AROM;10 reps Straight Leg Raises: AAROM;5 reps Long  Arc Quad: AROM;Left;10 reps Knee Flexion: AROM;Left;10 reps    General Comments        Pertinent Vitals/Pain Pain Assessment: 0-10 Pain Score: 6  Faces Pain Scale: Hurts even more Pain Descriptors / Indicators: Aching Pain Intervention(s): Monitored during session    Home Living                      Prior Function            PT Goals  (current goals can now be found in the care plan section) Acute Rehab PT Goals Patient Stated Goal: To return with her familiy Progress towards PT goals: Progressing toward goals    Frequency    BID      PT Plan Current plan remains appropriate    Co-evaluation              AM-PAC PT "6 Clicks" Mobility   Outcome Measure  Help needed turning from your back to your side while in a flat bed without using bedrails?: A Little Help needed moving from lying on your back to sitting on the side of a flat bed without using bedrails?: A Little Help needed moving to and from a bed to a chair (including a wheelchair)?: A Little Help needed standing up from a chair using your arms (e.g., wheelchair or bedside chair)?: A Little Help needed to walk in hospital room?: A Lot Help needed climbing 3-5 steps with a railing? : Total 6 Click Score: 15    End of Session Equipment Utilized During Treatment: Gait belt Activity Tolerance: Patient limited by pain;Patient tolerated treatment well Patient left: with chair alarm set;with call bell/phone within reach Nurse Communication: Mobility status PT Visit Diagnosis: Unsteadiness on feet (R26.81);History of falling (Z91.81);Other abnormalities of gait and mobility (R26.89);Muscle weakness (generalized) (M62.81)     Time: 9735-3299 PT Time Calculation (min) (ACUTE ONLY): 29 min  Charges:  $Gait Training: 8-22 mins $Therapeutic Exercise: 8-22 mins $Therapeutic Activity: 8-22 mins                     Kreg Shropshire, DPT 02/05/2019, 5:20 PM

## 2019-02-05 NOTE — Care Management Important Message (Signed)
Important Message  Patient Details  Name: Kimberly Hudson MRN: [id number] Date of Birth: April 27, 1956   Medicare Important Message Given:  Yes    Juliann Pulse A Inaaya Vellucci 02/05/2019, 10:06 AM

## 2019-02-05 NOTE — Progress Notes (Signed)
[place] [facility] [place] NAME: Kimberly Hudson    MR#:  [id number]  DATE OF BIRTH:  1956/07/03  SUBJECTIVE:  CHIEF COMPLAINT:   Chief Complaint  Patient presents with  . Fall   Patient without complaint, d/w CM/SW-insurance authorization did not go through, awaiting peer-to-peer review REVIEW OF SYSTEMS:  CONSTITUTIONAL: No fever, fatigue or weakness.  EYES: No blurred or double vision.  EARS, NOSE, AND THROAT: No tinnitus or ear pain.  RESPIRATORY: No cough, shortness of breath, wheezing or hemoptysis.  CARDIOVASCULAR: No chest pain, orthopnea, edema.  GASTROINTESTINAL: No nausea, vomiting, diarrhea or abdominal pain.  GENITOURINARY: No dysuria, hematuria.  ENDOCRINE: No polyuria, nocturia,  HEMATOLOGY: No anemia, easy bruising or bleeding SKIN: No rash or lesion. MUSCULOSKELETAL: No joint pain or arthritis.   NEUROLOGIC: No tingling, numbness, weakness.  PSYCHIATRY: No anxiety or depression.   ROS  DRUG ALLERGIES:   Allergies  Allergen Reactions  . Nsaids Other (See Comments)    Because of Crohns Because of Crohns  . Sulfa Antibiotics Rash    VITALS:  Blood pressure 110/60, pulse 78, temperature 98.7 F (37.1 C), temperature source Oral, resp. rate 18, height 5' 7"  (1.702 m), weight 88.5 kg, SpO2 100 %.  PHYSICAL EXAMINATION:  GENERAL:  63 y.o.-year-old patient lying in the bed with no acute distress.  EYES: Pupils equal, round, reactive to light and accommodation. No scleral icterus. Extraocular muscles intact.  HEENT: Head atraumatic, normocephalic. Oropharynx and nasopharynx clear.  NECK:  Supple, no jugular venous distention. No thyroid enlargement, no tenderness.  LUNGS: Normal breath sounds bilaterally, no wheezing, rales,rhonchi or crepitation. No use of accessory muscles of respiration.  CARDIOVASCULAR: S1, S2 normal. No murmurs, rubs, or gallops.  ABDOMEN: Soft, nontender, nondistended. Bowel sounds present. No  organomegaly or mass.  EXTREMITIES: No pedal edema, cyanosis, or clubbing.  NEUROLOGIC: Cranial nerves II through XII are intact. Muscle strength 5/5 in all extremities. Sensation intact. Gait not checked.  PSYCHIATRIC: The patient is alert and oriented x 3.  SKIN: No obvious rash, lesion, or ulcer.   Physical Exam LABORATORY PANEL:   CBC Recent Labs  Lab 02/05/19 0441  WBC 5.0  HGB 8.5*  HCT 28.2*  PLT 384   ------------------------------------------------------------------------------------------------------------------  Chemistries  Recent Labs  Lab 01/30/19 0407 01/31/19 0510  02/01/19 0306 02/05/19 0441  NA 142 141  --   --   --   K 3.7 2.9*   < > 4.5  --   CL 105 104  --   --   --   CO2 30 30  --   --   --   GLUCOSE 103* 88  --   --   --   BUN 9 9  --   --   --   CREATININE 0.83 0.69  --  0.79 0.85  CALCIUM 7.7* 7.5*  --   --   --   MG 2.1  --   --   --   --    < > = values in this interval not displayed.   ------------------------------------------------------------------------------------------------------------------  Cardiac Enzymes No results for input(s): TROPONINI in the last 168 hours. ------------------------------------------------------------------------------------------------------------------  RADIOLOGY:  No results found.  ASSESSMENT AND PLAN:  *Acute right intertrochanteric hip fracture Status post repair Patient to go to rehab once insurance authorizes.  Peer to peer evaluation currently in [phone], awaiting callback.  Prophylactic Lovenox and pain medications ordered by orthopedic surgery  *Postoperative anemia and chronic anemia Improved  status post blood transfusion, given IV Venofer x1, continue B12 supplementation  *Crohn's disease with chronic diarrhea Stable CT scan shows strictures Patient on Entocort and rifaximin  *Acute hypokalemia/hypophosphatemia/hypomagnesemia Repleted  *Leukopenia Stable  *Chronic  hyperlipidemia, unspecified Continue pravastatin  *Chronic vitamin B-12 and D deficiency Stable Continue supplementation  Disposition to skilled nursing facility/inpatient rehab versus home with home health services, per social work/case management-was denied by insurance, awaiting peer-to-peer callback at this time per above.    All the records are reviewed and case discussed with Care Management/Social Workerr. Management plans discussed with the patient, family and they are in agreement.  CODE STATUS: full  TOTAL TIME TAKING CARE OF THIS PATIENT: 35 minutes.   POSSIBLE D/C IN 1-2 DAYS, DEPENDING ON CLINICAL CONDITION.   Avel Peace [place] M.D on 02/05/2019   Between 7am to 6pm - Pager - [phone]  After 6pm go to [email or web address] - password [email or web address] [facility]  Office  [phone]  CC: Primary care physician; Birdie Sons, MD  Note: This dictation was prepared with Dragon dictation along with smaller phrase technology. Any transcriptional errors that result from this process are unintentional.

## 2019-02-06 NOTE — Discharge Summary (Signed)
[place] [facility] [place] NAME: Kimberly Hudson    MR#:  [id number]  DATE OF BIRTH:  1956/10/22  DATE OF ADMISSION:  01/27/2019 ADMITTING PHYSICIAN: Arta Silence, MD  DATE OF DISCHARGE: 02/06/2019  PRIMARY CARE PHYSICIAN: Birdie Sons, MD   ADMISSION DIAGNOSIS:  Hypokalemia [E87.6] Closed nondisplaced intertrochanteric fracture of left femur, initial encounter ([place]) [S72.145A] Closed intertrochanteric fracture of hip, right, initial encounter ([place]) [S72.141A]  DISCHARGE DIAGNOSIS:  Active Problems:   Closed intertrochanteric fracture of hip, right, initial encounter ([place]) Acute hypokalemia Leukopenia Crohn's disease Chronic anemia Hypophosphatemia Hypomagnesemia SECONDARY DIAGNOSIS:   Past Medical History:  Diagnosis Date  . Anemia    past hx  . Anxiety   . Arthritis    osteo - shoulders, hips, knees  . Complication of anesthesia    propofol causes headaches  . Coronary artery disease   . Crohn's disease ([place])   . Depression   . GERD (gastroesophageal reflux disease)   . History of chicken pox   . History of measles   . History of mumps   . Hypertension   . 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 Wears dentures    full upper     ADMITTING HISTORY Kimberly Hudson  is a 63 y.o. female with a known history of Crohn's disease (Imuran) p/w generalized weakness, mechanical fall, R intertrochanteric hip Fx, profound hypokalemia. Pt AAOx3, but in moderate to severe distress 2/2 R hip pain at the time of my assessment. She endorses chronic diarrhea, generalized weakness and acute R hip pain (w/ muscle spasms) 2/2 fall/trauma, but is otherwise w/o complaint. She directs me to obtain Hx from her daughter. Pt apparently w/ severe DJD/OA, pending B/L knee replacements; walks w/ cane. Pt lives at home w/ son and daughter, no stairs inside house. Pt was walking from kitchen to bedroom, and apparently slipped and fell  forward. She tried to grab a chair to steady herself, but was unable to regain balance. She fell on her R knee and hip. She developed pain immediately. Imaging demonstrates, "Acute, varus angulated, basicervical fracture of the right femur extending through the intertrochanteric portion of the femur and undermining the greater trochanter. Avulsed lesser trochanter is also seen." K+ found to be < 2.0. Pt's daughter tells me pt has been ill for 3-4wks, but has also had chronic diarrhea "for years". She sees Dr. Allen Norris, and has apparently been on Imuran "for thirty years," though this does not seem to be effective in remedying pt's diarrhea. Pt's daughter states pt is "hard-headed" and does not want to change therapy. I am concerned that this is leading to malabsorption, malnourishment and severe GI electrolyte losses, which are likely to persist unless/until the underlying pathology is treated. I am also concerned that malnourishment/hypoalbuminemia may potentially impair wound-healing and affect a potential perioperative course.   HOSPITAL COURSE:  Patient was admitted to the medical floor.  Patient's potassium was replaced along with magnesium and phosphate.  Patient was seen by orthopedic attending for right hip fracture.  Patient underwent open reduction internal fixation surgery and tolerated the procedure well.Diarrhea has improved .DVt prophylaxis with [place] lovenox daily. Patient received physical therapy during hospitalization. Insurance authorization could not be obtained and was denied for snf placement. Home with home health services planned. Case management services were involved.Received blood transfusion for anemia during hospitalization.B 12 supplementation was also taken care of.Leukopenia is stable.  CONSULTS OBTAINED:  Treatment Team:  Arta Silence, MD Leim Fabry, MD  DRUG ALLERGIES:   Allergies  Allergen Reactions  . Nsaids Other (See Comments)    Because of Crohns Because of  Crohns  . Sulfa Antibiotics Rash    DISCHARGE MEDICATIONS:   Allergies as of 02/06/2019      Reactions   Nsaids Other (See Comments)   Because of Crohns Because of Crohns   Sulfa Antibiotics Rash      Medication List    STOP taking these medications   azaTHIOprine 50 MG tablet Commonly known as:  IMURAN   doxycycline 100 MG tablet Commonly known as:  VIBRA-TABS   fluconazole 100 MG tablet Commonly known as:  DIFLUCAN   HYDROcodone-acetaminophen 7.5-325 MG tablet Commonly known as:  NORCO   lisinopril-hydrochlorothiazide 10-12.5 MG tablet Commonly known as:  PRINZIDE,ZESTORETIC   mirtazapine 7.5 MG tablet Commonly known as:  REMERON   montelukast 10 MG tablet Commonly known as:  SINGULAIR   oseltamivir 75 MG capsule Commonly known as:  Tamiflu   promethazine-dextromethorphan 6.25-15 MG/5ML syrup Commonly known as:  PROMETHAZINE-DM     TAKE these medications   budesonide 3 MG 24 hr capsule Commonly known as:  ENTOCORT EC Take 3 capsules (9 mg total) by mouth daily.   clonazePAM 0.5 MG tablet Commonly known as:  KLONOPIN Take 1 tablet (0.5 mg total) by mouth 3 (three) times daily as needed for anxiety. Take half tablet at bedtime tuesdays and saturdays and continue 1 tablet rest of the days What changed:    medication strength  how much to take  when to take this  reasons to take this   cyanocobalamin 1000 MCG/ML injection Commonly known as:  (VITAMIN B-12) Inject 1 mL (1,000 mcg total) into the muscle once a week. Start taking on:  February 08, 2019   cyclobenzaprine 5 MG tablet Commonly known as:  FLEXERIL TAKE 1 TABLET BY MOUTH THREE TIMES A DAY AS NEEDED FOR MUSCLE SPASMS   dicyclomine 20 MG tablet Commonly known as:  BENTYL TAKE 1 TABLET (20 MG TOTAL) BY MOUTH 3 (THREE) TIMES DAILY BEFORE MEALS.   diphenoxylate-atropine 2.5-0.025 MG tablet Commonly known as:  LOMOTIL Take 1 tablet by mouth 4 (four) times daily as needed for diarrhea or loose  stools.   enoxaparin 40 MG/0.4ML injection Commonly known as:  LOVENOX Inject 0.4 mLs (40 mg total) into the skin daily.   feeding supplement (PRO-STAT SUGAR FREE 64) Liqd Take 30 mLs by mouth 2 (two) times daily.   FLUoxetine 40 MG capsule Commonly known as:  PROZAC Take 2 capsules (80 mg total) by mouth daily.   fluticasone 50 MCG/ACT nasal spray Commonly known as:  FLONASE Place 2 sprays into both nostrils daily.   multivitamin with minerals Tabs tablet Take 1 tablet by mouth daily.   oxyCODONE 5 MG immediate release tablet Commonly known as:  Oxy IR/ROXICODONE Take 1-2 tablets (5-10 mg total) by mouth every 4 (four) hours as needed for severe pain (pain score 7-10).   pantoprazole 40 MG tablet Commonly known as:  PROTONIX TAKE 1 TABLET BY MOUTH TWICE A DAY   potassium chloride 10 MEQ tablet Commonly known as:  K-DUR,KLOR-CON Take 1 tablet (10 mEq total) by mouth daily.   pravastatin 10 MG tablet Commonly known as:  PRAVACHOL TAKE 1 TABLET BY MOUTH EVERY DAY   promethazine 25 MG tablet Commonly known as:  PHENERGAN TAKE 1 TABLET BY MOUTH EVERY 6 HOURS AS NEEDED FOR NAUSEA AND VOMITING   rifaximin 550 MG Tabs tablet Commonly known as:  XIFAXAN Take 1 tablet (550 mg total) by mouth 2 (two) times daily.   rOPINIRole 0.5 MG tablet Commonly known as:  REQUIP TAKE 1/2 TAB AT BEDTIME X3 DAYS, THEN 1 AT BEDTIME X3 DAYS, THEN INCREASE TO 2 AT BEDTIME IF NEEDED.   Symbicort 80-4.5 MCG/ACT inhaler Generic drug:  budesonide-formoterol TAKE 2 PUFFS BY MOUTH TWICE A DAY   traMADol 50 MG tablet Commonly known as:  ULTRAM Take 1 tablet (50 mg total) by mouth every 6 (six) hours as needed for moderate pain.   Vitamin D (Ergocalciferol) 1.25 MG (50000 UT) Caps capsule Commonly known as:  DRISDOL Take 1 capsule (50,000 Units total) by mouth every 7 (seven) days for 20 doses.            Durable Medical Equipment  (From admission, onward)         Start     Ordered    02/06/19 1027  For home use only DME Bedside commode  Once    Question:  Patient needs a bedside commode to treat with the following condition  Answer:  Hip fracture ([place])   02/06/19 1027   02/06/19 1014  For home use only DME Walker rolling  Once    Question:  Patient needs a walker to treat with the following condition  Answer:  Ambulatory dysfunction   02/06/19 1013          Today  Patient seen today No new complaints Tolerating diet ok Hemodynamically stable. VITAL SIGNS:  Blood pressure (!) 115/59, pulse 81, temperature 98.1 F (36.7 C), temperature source Oral, resp. rate 18, height 5' 7"  (1.702 m), weight 88.5 kg, SpO2 93 %.  I/O:    Intake/Output Summary (Last 24 hours) at 02/06/2019 1039 Last data filed at 02/05/2019 1353 Gross per 24 hour  Intake 240 ml  Output -  Net 240 ml    PHYSICAL EXAMINATION:  Physical Exam  GENERAL:  63 y.o.-year-old patient lying in the bed with no acute distress.  LUNGS: Normal breath sounds bilaterally, no wheezing, rales,rhonchi or crepitation. No use of accessory muscles of respiration.  CARDIOVASCULAR: S1, S2 normal. No murmurs, rubs, or gallops.  ABDOMEN: Soft, non-tender, non-distended. Bowel sounds present. No organomegaly or mass.  NEUROLOGIC: Moves all 4 extremities. PSYCHIATRIC: The patient is alert and oriented x 3.  SKIN: No obvious rash, lesion, or ulcer.   DATA REVIEW:   CBC Recent Labs  Lab 02/05/19 0441  WBC 5.0  HGB 8.5*  HCT 28.2*  PLT 384    Chemistries  Recent Labs  Lab 01/31/19 0510  02/01/19 0306 02/05/19 0441  NA 141  --   --   --   K 2.9*   < > 4.5  --   CL 104  --   --   --   CO2 30  --   --   --   GLUCOSE 88  --   --   --   BUN 9  --   --   --   CREATININE 0.69  --  0.79 0.85  CALCIUM 7.5*  --   --   --    < > = values in this interval not displayed.    Cardiac Enzymes No results for input(s): TROPONINI in the last 168 hours.  Microbiology Results  No results found for this or  any previous visit.  RADIOLOGY:  No results found.  Follow up with PCP in 1 week.  Management plans discussed with the patient, family and  they are in agreement.  CODE STATUS: Full code    Code Status Orders  (From admission, onward)         Start     Ordered   01/28/19 0452  Full code  Continuous     01/28/19 0451        Code Status History    This patient has a current code status but no historical code status.      TOTAL TIME TAKING CARE OF THIS PATIENT ON DAY OF DISCHARGE: more than 35 minutes.   Saundra Shelling M.D on 02/06/2019 at 10:39 AM  Between 7am to 6pm - Pager - [phone]  After 6pm go to [email or web address] - password [email or web address] [facility]  [facility]  Office  [phone]  CC: Primary care physician; Birdie Sons, MD  Note: This dictation was prepared with Dragon dictation along with smaller phrase technology. Any transcriptional errors that result from this process are unintentional.

## 2019-02-06 NOTE — Progress Notes (Signed)
Patient is being discharged home with [facility]. Daughter is here to pick up mom's belongings. DC & Rx instructions given and patient acknowledged understanding. IV removed. Patient is being transported via EMS and they have been called.

## 2019-02-06 NOTE — Progress Notes (Signed)
Physical Therapy Treatment Patient Details Name: Kimberly Hudson MRN: [id number] DOB: 02/21/1956 Today's Date: 02/06/2019    History of Present Illness 63 yo female with a PMH that includes Crohn's disease, anxiety, CAD, depression, GERD, and HTN presented to the ED after a fall resulting in a R intertrochanteric hip fracture.  Pt is s/p intramedullary nailing of R femur with cephalomedullary device.  MD assessment also includes: weakness, chronic diarrhea, hypochloremia, hyperglycemia, hypocalcemia, transaminasemia, hypoproteinemia/hypoalbuminemia, leukopenia (w/o neutropenia), and macrocytic anemia.     PT Comments    Patient needs min assist for transfers sit to stand with RW. She is able to perform standing exercises on RLE and seated exercise with pain 7/10 limiting her mobility. She ambulated 4 feet without lifting her feet off the floor by pivoting and twisting her LLE and pushing with her UE's on RW.  PT is recommending SNF due to level of mobility and decreased ambulation ability. . She is able to keep her TDWB RLE. DC to SNF was discussed and patient wants to be discharged home with help 24 hour assist from her daughter. She will benefit from skilled PT to improve mobility and strength.   Follow Up Recommendations  SNF     Equipment Recommendations       Recommendations for Other Services       Precautions / Restrictions Precautions Precautions: Fall Restrictions Weight Bearing Restrictions: Yes RLE Weight Bearing: Touchdown weight bearing    Mobility  Bed Mobility Overal bed mobility: Needs Assistance Bed Mobility: Supine to Sit       Sit to supine: Modified independent (Device/Increase time)   General bed mobility comments: Pt needs assist to get LEs up in to bed, but did show good effort in trying to use L LE to help R, unable  Transfers Overall transfer level: Needs assistance Equipment used: Rolling walker (2 wheeled) Transfers: Sit to/from Stand Sit to  Stand: Min guard         General transfer comment: Pt was able to rise from standard height recliner X 2 during session.  She was highly reliant on UEs, but did manange to rise w/o physical assist  Ambulation/Gait Ambulation/Gait assistance: Min guard Gait Distance (Feet): (4 feet) Assistive device: Rolling walker (2 wheeled)       General Gait Details: (Patient needs assist to steady RW and pivoted on her LLE)   Stairs             Wheelchair Mobility    Modified Rankin (Stroke Patients Only)       Balance Overall balance assessment: Needs assistance Sitting-balance support: Feet supported Sitting balance-Leahy Scale: Good     Standing balance support: Bilateral upper extremity supported Standing balance-Leahy Scale: Fair Standing balance comment: highly reliant on walker, able to keep weight off R, but often in an inefficient/uncoordinated way despite cuing                            Cognition Arousal/Alertness: Awake/alert Behavior During Therapy: WFL for tasks assessed/performed Overall Cognitive Status: Within Functional Limits for tasks assessed                                        Exercises Total Joint Exercises Ankle Circles/Pumps: AROM;Strengthening;Right;Left;5 reps Short Arc Quad: AROM;AAROM;Strengthening;Right;Left;5 reps Hip ABduction/ADduction: AAROM;Strengthening;Right;Left;5 reps Long Arc Quad: AROM;AAROM;Strengthening;Right;Left;5 reps General Exercises - Lower Extremity Ankle  Circles/Pumps: AROM;Strengthening;Right;Left;5 reps Quad Sets: AROM;Strengthening;Both;5 reps Gluteal Sets: AROM;Strengthening;Both;5 reps    General Comments        Pertinent Vitals/Pain Pain Assessment: 0-10 Pain Score: 7  Pain Descriptors / Indicators: Aching Pain Intervention(s): Monitored during session    Home Living                      Prior Function            PT Goals (current goals can now be found in  the care plan section) Progress towards PT goals: Progressing toward goals    Frequency    BID      PT Plan Current plan remains appropriate    Co-evaluation              AM-PAC PT "6 Clicks" Mobility   Outcome Measure  Help needed turning from your back to your side while in a flat bed without using bedrails?: A Little Help needed moving from lying on your back to sitting on the side of a flat bed without using bedrails?: A Little Help needed moving to and from a bed to a chair (including a wheelchair)?: A Little Help needed standing up from a chair using your arms (e.g., wheelchair or bedside chair)?: A Little Help needed to walk in hospital room?: A Lot Help needed climbing 3-5 steps with a railing? : Total 6 Click Score: 15    End of Session Equipment Utilized During Treatment: Gait belt Activity Tolerance: Patient limited by pain;Patient tolerated treatment well Patient left: with chair alarm set;with call bell/phone within reach   PT Visit Diagnosis: Unsteadiness on feet (R26.81);Muscle weakness (generalized) (M62.81);Difficulty in walking, not elsewhere classified (R26.2)     Time: 4360-6770 PT Time Calculation (min) (ACUTE ONLY): 38 min  Charges:  $Gait Training: 8-22 mins $Therapeutic Exercise: 23-37 mins                       Alanson Puls, PT DPT 02/06/2019, 11:53 AM

## 2019-02-06 NOTE — TOC Progression Note (Addendum)
Transition of Care [facility]) - Progression Note    Patient Details  Name: Kimberly Hudson MRN: [id number] Date of Birth: [birth year]  [facility]) CM/SW Contact  Latanya Maudlin, RN Phone Number: 02/06/2019, 10:52 AM  Clinical Narrative:   Patient to be discharged per MD order. Orders in place for home health services. Patient was denied SNF due to [facility] authorization issues. Patient agreeable to home health. CMS Medicare.gov Compare Post Acute Care list reviewed with patient and she has no preference of agency. Discussed case with Helene Kelp from [place] but due to [place] sessions would require $40 co pay. [facility] able to take referral. Spoke with Malachy Mood from [facility] regarding discharge. Patient has rolling walker in the home. Orders for bedside commode. Notified Brad from [facility] of DME needs and discharge planning for today.      Expected Discharge Plan: Skilled Nursing Facility Barriers to Discharge: No Barriers Identified  Expected Discharge Plan and Services Expected Discharge Plan: [place]   Discharge Planning Services: CM Consult Post Acute Care Choice: Home Health, Durable Medical Equipment Living arrangements for the past 2 months: Group Home, Mobile Home(has 6 steps into home) Expected Discharge Date: 02/06/19               DME Arranged: Bedside commode DME Agency: [facility] HH Arranged: RN, PT, OT HH Agency: [place]   Social Determinants of Health (SDOH) Interventions    Readmission Risk Interventions Readmission Risk Prevention Plan 02/01/2019  Transportation Screening Complete  PCP or Specialist Appt within 3-5 Days Complete  HRI or [place] Not Complete  HRI or Home Care Consult comments patient is going to [facility]  Social Work Consult for [place] Planning/Counseling Complete  Palliative Care Screening Not Applicable  Medication Review [employer]) Complete  Some recent data might be  hidden

## 2019-02-07 DIAGNOSIS — S72141A Displaced intertrochanteric fracture of right femur, initial encounter for closed fracture: Secondary | ICD-10-CM | POA: Diagnosis not present

## 2019-02-10 DIAGNOSIS — D510 Vitamin B12 deficiency anemia due to intrinsic factor deficiency: Secondary | ICD-10-CM | POA: Diagnosis not present

## 2019-02-10 DIAGNOSIS — M19011 Primary osteoarthritis, right shoulder: Secondary | ICD-10-CM | POA: Diagnosis not present

## 2019-02-10 DIAGNOSIS — M19012 Primary osteoarthritis, left shoulder: Secondary | ICD-10-CM | POA: Diagnosis not present

## 2019-02-10 DIAGNOSIS — S72141D Displaced intertrochanteric fracture of right femur, subsequent encounter for closed fracture with routine healing: Secondary | ICD-10-CM | POA: Diagnosis not present

## 2019-02-10 DIAGNOSIS — E559 Vitamin D deficiency, unspecified: Secondary | ICD-10-CM | POA: Diagnosis not present

## 2019-02-10 DIAGNOSIS — K5090 Crohn's disease, unspecified, without complications: Secondary | ICD-10-CM | POA: Diagnosis not present

## 2019-02-10 DIAGNOSIS — I2510 Atherosclerotic heart disease of native coronary artery without angina pectoris: Secondary | ICD-10-CM | POA: Diagnosis not present

## 2019-02-10 DIAGNOSIS — S72145D Nondisplaced intertrochanteric fracture of left femur, subsequent encounter for closed fracture with routine healing: Secondary | ICD-10-CM | POA: Diagnosis not present

## 2019-02-10 DIAGNOSIS — M160 Bilateral primary osteoarthritis of hip: Secondary | ICD-10-CM | POA: Diagnosis not present

## 2019-02-10 DIAGNOSIS — M170 Bilateral primary osteoarthritis of knee: Secondary | ICD-10-CM | POA: Diagnosis not present

## 2019-02-12 DIAGNOSIS — S72145D Nondisplaced intertrochanteric fracture of left femur, subsequent encounter for closed fracture with routine healing: Secondary | ICD-10-CM | POA: Diagnosis not present

## 2019-02-12 DIAGNOSIS — M19011 Primary osteoarthritis, right shoulder: Secondary | ICD-10-CM | POA: Diagnosis not present

## 2019-02-12 DIAGNOSIS — M160 Bilateral primary osteoarthritis of hip: Secondary | ICD-10-CM | POA: Diagnosis not present

## 2019-02-12 DIAGNOSIS — K5090 Crohn's disease, unspecified, without complications: Secondary | ICD-10-CM | POA: Diagnosis not present

## 2019-02-12 DIAGNOSIS — E559 Vitamin D deficiency, unspecified: Secondary | ICD-10-CM | POA: Diagnosis not present

## 2019-02-12 DIAGNOSIS — D510 Vitamin B12 deficiency anemia due to intrinsic factor deficiency: Secondary | ICD-10-CM | POA: Diagnosis not present

## 2019-02-12 DIAGNOSIS — M19012 Primary osteoarthritis, left shoulder: Secondary | ICD-10-CM | POA: Diagnosis not present

## 2019-02-12 DIAGNOSIS — S72141D Displaced intertrochanteric fracture of right femur, subsequent encounter for closed fracture with routine healing: Secondary | ICD-10-CM | POA: Diagnosis not present

## 2019-02-12 DIAGNOSIS — M170 Bilateral primary osteoarthritis of knee: Secondary | ICD-10-CM | POA: Diagnosis not present

## 2019-02-12 DIAGNOSIS — I2510 Atherosclerotic heart disease of native coronary artery without angina pectoris: Secondary | ICD-10-CM | POA: Diagnosis not present

## 2019-02-13 ENCOUNTER — Encounter: Payer: Self-pay | Admitting: Family Medicine

## 2019-02-15 ENCOUNTER — Other Ambulatory Visit: Payer: Self-pay

## 2019-02-15 DIAGNOSIS — M25551 Pain in right hip: Secondary | ICD-10-CM | POA: Diagnosis not present

## 2019-02-15 NOTE — Telephone Encounter (Signed)
Patient requesting refill on hydrocodone 7.5-325mg

## 2019-02-15 NOTE — Telephone Encounter (Signed)
I don't see this on her med list please advise.   Thanks,   -Mickel Baas

## 2019-02-15 NOTE — Telephone Encounter (Signed)
Tried calling; No answer.   Thanks,   -Mickel Baas

## 2019-02-15 NOTE — Telephone Encounter (Signed)
She got an prescription for oxycodone from [facility] on 01-31-2019. Please verify whether she has stopped taking oxycodone. If so we can change her back to hydrocodone/apap, but cannot take both.

## 2019-02-16 DIAGNOSIS — K5090 Crohn's disease, unspecified, without complications: Secondary | ICD-10-CM | POA: Diagnosis not present

## 2019-02-16 DIAGNOSIS — S72141D Displaced intertrochanteric fracture of right femur, subsequent encounter for closed fracture with routine healing: Secondary | ICD-10-CM | POA: Diagnosis not present

## 2019-02-16 DIAGNOSIS — E559 Vitamin D deficiency, unspecified: Secondary | ICD-10-CM | POA: Diagnosis not present

## 2019-02-16 DIAGNOSIS — D510 Vitamin B12 deficiency anemia due to intrinsic factor deficiency: Secondary | ICD-10-CM | POA: Diagnosis not present

## 2019-02-16 DIAGNOSIS — I2510 Atherosclerotic heart disease of native coronary artery without angina pectoris: Secondary | ICD-10-CM | POA: Diagnosis not present

## 2019-02-16 DIAGNOSIS — M170 Bilateral primary osteoarthritis of knee: Secondary | ICD-10-CM | POA: Diagnosis not present

## 2019-02-16 DIAGNOSIS — S72145D Nondisplaced intertrochanteric fracture of left femur, subsequent encounter for closed fracture with routine healing: Secondary | ICD-10-CM | POA: Diagnosis not present

## 2019-02-16 DIAGNOSIS — M19011 Primary osteoarthritis, right shoulder: Secondary | ICD-10-CM | POA: Diagnosis not present

## 2019-02-16 DIAGNOSIS — M19012 Primary osteoarthritis, left shoulder: Secondary | ICD-10-CM | POA: Diagnosis not present

## 2019-02-16 DIAGNOSIS — M160 Bilateral primary osteoarthritis of hip: Secondary | ICD-10-CM | POA: Diagnosis not present

## 2019-02-16 NOTE — Telephone Encounter (Signed)
Spoke to pt ans she advised that she was seen again on 02/15/19 by Dr Prescott Parma and he gave her another medication.  dbs

## 2019-02-17 DIAGNOSIS — D510 Vitamin B12 deficiency anemia due to intrinsic factor deficiency: Secondary | ICD-10-CM

## 2019-02-17 DIAGNOSIS — M160 Bilateral primary osteoarthritis of hip: Secondary | ICD-10-CM | POA: Diagnosis not present

## 2019-02-17 DIAGNOSIS — M19011 Primary osteoarthritis, right shoulder: Secondary | ICD-10-CM

## 2019-02-17 DIAGNOSIS — M170 Bilateral primary osteoarthritis of knee: Secondary | ICD-10-CM | POA: Diagnosis not present

## 2019-02-17 DIAGNOSIS — M19012 Primary osteoarthritis, left shoulder: Secondary | ICD-10-CM

## 2019-02-17 DIAGNOSIS — K5090 Crohn's disease, unspecified, without complications: Secondary | ICD-10-CM | POA: Diagnosis not present

## 2019-02-17 DIAGNOSIS — I2510 Atherosclerotic heart disease of native coronary artery without angina pectoris: Secondary | ICD-10-CM | POA: Diagnosis not present

## 2019-02-17 DIAGNOSIS — S72141D Displaced intertrochanteric fracture of right femur, subsequent encounter for closed fracture with routine healing: Secondary | ICD-10-CM | POA: Diagnosis not present

## 2019-02-17 DIAGNOSIS — S72145D Nondisplaced intertrochanteric fracture of left femur, subsequent encounter for closed fracture with routine healing: Secondary | ICD-10-CM | POA: Diagnosis not present

## 2019-02-17 DIAGNOSIS — E559 Vitamin D deficiency, unspecified: Secondary | ICD-10-CM | POA: Diagnosis not present

## 2019-02-19 DIAGNOSIS — K5090 Crohn's disease, unspecified, without complications: Secondary | ICD-10-CM | POA: Diagnosis not present

## 2019-02-19 DIAGNOSIS — D510 Vitamin B12 deficiency anemia due to intrinsic factor deficiency: Secondary | ICD-10-CM | POA: Diagnosis not present

## 2019-02-19 DIAGNOSIS — S72145D Nondisplaced intertrochanteric fracture of left femur, subsequent encounter for closed fracture with routine healing: Secondary | ICD-10-CM | POA: Diagnosis not present

## 2019-02-19 DIAGNOSIS — M19011 Primary osteoarthritis, right shoulder: Secondary | ICD-10-CM | POA: Diagnosis not present

## 2019-02-19 DIAGNOSIS — I2510 Atherosclerotic heart disease of native coronary artery without angina pectoris: Secondary | ICD-10-CM | POA: Diagnosis not present

## 2019-02-19 DIAGNOSIS — M19012 Primary osteoarthritis, left shoulder: Secondary | ICD-10-CM | POA: Diagnosis not present

## 2019-02-19 DIAGNOSIS — M160 Bilateral primary osteoarthritis of hip: Secondary | ICD-10-CM | POA: Diagnosis not present

## 2019-02-19 DIAGNOSIS — M170 Bilateral primary osteoarthritis of knee: Secondary | ICD-10-CM | POA: Diagnosis not present

## 2019-02-19 DIAGNOSIS — E559 Vitamin D deficiency, unspecified: Secondary | ICD-10-CM | POA: Diagnosis not present

## 2019-02-19 DIAGNOSIS — S72141D Displaced intertrochanteric fracture of right femur, subsequent encounter for closed fracture with routine healing: Secondary | ICD-10-CM | POA: Diagnosis not present

## 2019-02-22 ENCOUNTER — Other Ambulatory Visit: Payer: Self-pay | Admitting: Family Medicine

## 2019-02-22 ENCOUNTER — Other Ambulatory Visit: Payer: Self-pay | Admitting: *Deleted

## 2019-02-22 DIAGNOSIS — M1711 Unilateral primary osteoarthritis, right knee: Secondary | ICD-10-CM

## 2019-02-22 NOTE — Telephone Encounter (Signed)
Please review

## 2019-02-22 NOTE — Telephone Encounter (Signed)
Patient called back and stated the ortho office called her back and advised her they are going to send her in #30 of the hydrocodone.

## 2019-02-22 NOTE — Telephone Encounter (Signed)
Patient wanted to let Dr. Caryn Section know she called her ortho office and they would not let patient's call in refill requests. Please advise refill?

## 2019-02-23 NOTE — Telephone Encounter (Signed)
Please clarify with patient how she is taking clonazepam. She had been take one at bedtime, but prescription from [facility] has different directions.

## 2019-02-23 NOTE — Telephone Encounter (Signed)
I called and spoke with patient. She states she is taking one at bedtime. She mentioned that she fell and shattered her hip and was in the hospital, and that's when they changed her medications around. She says she is currently taking one at bedtime like Dr. Caryn Section prescribed.

## 2019-02-24 DIAGNOSIS — S72145D Nondisplaced intertrochanteric fracture of left femur, subsequent encounter for closed fracture with routine healing: Secondary | ICD-10-CM | POA: Diagnosis not present

## 2019-02-24 DIAGNOSIS — M170 Bilateral primary osteoarthritis of knee: Secondary | ICD-10-CM | POA: Diagnosis not present

## 2019-02-24 DIAGNOSIS — E559 Vitamin D deficiency, unspecified: Secondary | ICD-10-CM | POA: Diagnosis not present

## 2019-02-24 DIAGNOSIS — D510 Vitamin B12 deficiency anemia due to intrinsic factor deficiency: Secondary | ICD-10-CM | POA: Diagnosis not present

## 2019-02-24 DIAGNOSIS — I2510 Atherosclerotic heart disease of native coronary artery without angina pectoris: Secondary | ICD-10-CM | POA: Diagnosis not present

## 2019-02-24 DIAGNOSIS — M19011 Primary osteoarthritis, right shoulder: Secondary | ICD-10-CM | POA: Diagnosis not present

## 2019-02-24 DIAGNOSIS — S72141D Displaced intertrochanteric fracture of right femur, subsequent encounter for closed fracture with routine healing: Secondary | ICD-10-CM | POA: Diagnosis not present

## 2019-02-24 DIAGNOSIS — M160 Bilateral primary osteoarthritis of hip: Secondary | ICD-10-CM | POA: Diagnosis not present

## 2019-02-24 DIAGNOSIS — K5090 Crohn's disease, unspecified, without complications: Secondary | ICD-10-CM | POA: Diagnosis not present

## 2019-02-24 DIAGNOSIS — M19012 Primary osteoarthritis, left shoulder: Secondary | ICD-10-CM | POA: Diagnosis not present

## 2019-02-26 DIAGNOSIS — M19011 Primary osteoarthritis, right shoulder: Secondary | ICD-10-CM | POA: Diagnosis not present

## 2019-02-26 DIAGNOSIS — E559 Vitamin D deficiency, unspecified: Secondary | ICD-10-CM | POA: Diagnosis not present

## 2019-02-26 DIAGNOSIS — I2510 Atherosclerotic heart disease of native coronary artery without angina pectoris: Secondary | ICD-10-CM | POA: Diagnosis not present

## 2019-02-26 DIAGNOSIS — M160 Bilateral primary osteoarthritis of hip: Secondary | ICD-10-CM | POA: Diagnosis not present

## 2019-02-26 DIAGNOSIS — K5090 Crohn's disease, unspecified, without complications: Secondary | ICD-10-CM | POA: Diagnosis not present

## 2019-02-26 DIAGNOSIS — S72145D Nondisplaced intertrochanteric fracture of left femur, subsequent encounter for closed fracture with routine healing: Secondary | ICD-10-CM | POA: Diagnosis not present

## 2019-02-26 DIAGNOSIS — S72141D Displaced intertrochanteric fracture of right femur, subsequent encounter for closed fracture with routine healing: Secondary | ICD-10-CM | POA: Diagnosis not present

## 2019-02-26 DIAGNOSIS — M170 Bilateral primary osteoarthritis of knee: Secondary | ICD-10-CM | POA: Diagnosis not present

## 2019-02-26 DIAGNOSIS — D510 Vitamin B12 deficiency anemia due to intrinsic factor deficiency: Secondary | ICD-10-CM | POA: Diagnosis not present

## 2019-02-26 DIAGNOSIS — M19012 Primary osteoarthritis, left shoulder: Secondary | ICD-10-CM | POA: Diagnosis not present

## 2019-03-03 ENCOUNTER — Telehealth: Payer: Self-pay | Admitting: Family Medicine

## 2019-03-03 DIAGNOSIS — M1711 Unilateral primary osteoarthritis, right knee: Secondary | ICD-10-CM

## 2019-03-03 NOTE — Telephone Encounter (Signed)
Pt needs refill on hydrocodone 7.5-325  She said her surgeon that did the surgery on her knee stopped giving her the hydrocodone on Monday.  He changed her to Ultram but it is not helping.  [facility]  Pt's CB# [phone]  Thanks Con Memos

## 2019-03-04 DIAGNOSIS — S72141D Displaced intertrochanteric fracture of right femur, subsequent encounter for closed fracture with routine healing: Secondary | ICD-10-CM | POA: Diagnosis not present

## 2019-03-04 DIAGNOSIS — M19012 Primary osteoarthritis, left shoulder: Secondary | ICD-10-CM | POA: Diagnosis not present

## 2019-03-04 DIAGNOSIS — M160 Bilateral primary osteoarthritis of hip: Secondary | ICD-10-CM | POA: Diagnosis not present

## 2019-03-04 DIAGNOSIS — M19011 Primary osteoarthritis, right shoulder: Secondary | ICD-10-CM | POA: Diagnosis not present

## 2019-03-04 DIAGNOSIS — S72145D Nondisplaced intertrochanteric fracture of left femur, subsequent encounter for closed fracture with routine healing: Secondary | ICD-10-CM | POA: Diagnosis not present

## 2019-03-04 DIAGNOSIS — E559 Vitamin D deficiency, unspecified: Secondary | ICD-10-CM | POA: Diagnosis not present

## 2019-03-04 DIAGNOSIS — D510 Vitamin B12 deficiency anemia due to intrinsic factor deficiency: Secondary | ICD-10-CM | POA: Diagnosis not present

## 2019-03-04 DIAGNOSIS — M170 Bilateral primary osteoarthritis of knee: Secondary | ICD-10-CM | POA: Diagnosis not present

## 2019-03-04 DIAGNOSIS — K5090 Crohn's disease, unspecified, without complications: Secondary | ICD-10-CM | POA: Diagnosis not present

## 2019-03-04 DIAGNOSIS — I2510 Atherosclerotic heart disease of native coronary artery without angina pectoris: Secondary | ICD-10-CM | POA: Diagnosis not present

## 2019-03-04 MED ORDER — HYDROCODONE-ACETAMINOPHEN 7.5-325 MG PO TABS: 1.0000 | ORAL_TABLET | Freq: Four times a day (QID) | ORAL | 0 refills | Status: AC | PRN

## 2019-03-05 DIAGNOSIS — M19012 Primary osteoarthritis, left shoulder: Secondary | ICD-10-CM | POA: Diagnosis not present

## 2019-03-05 DIAGNOSIS — D510 Vitamin B12 deficiency anemia due to intrinsic factor deficiency: Secondary | ICD-10-CM | POA: Diagnosis not present

## 2019-03-05 DIAGNOSIS — S72141D Displaced intertrochanteric fracture of right femur, subsequent encounter for closed fracture with routine healing: Secondary | ICD-10-CM | POA: Diagnosis not present

## 2019-03-05 DIAGNOSIS — K5090 Crohn's disease, unspecified, without complications: Secondary | ICD-10-CM | POA: Diagnosis not present

## 2019-03-05 DIAGNOSIS — S72145D Nondisplaced intertrochanteric fracture of left femur, subsequent encounter for closed fracture with routine healing: Secondary | ICD-10-CM | POA: Diagnosis not present

## 2019-03-05 DIAGNOSIS — I2510 Atherosclerotic heart disease of native coronary artery without angina pectoris: Secondary | ICD-10-CM | POA: Diagnosis not present

## 2019-03-05 DIAGNOSIS — E559 Vitamin D deficiency, unspecified: Secondary | ICD-10-CM | POA: Diagnosis not present

## 2019-03-05 DIAGNOSIS — M170 Bilateral primary osteoarthritis of knee: Secondary | ICD-10-CM | POA: Diagnosis not present

## 2019-03-05 DIAGNOSIS — M160 Bilateral primary osteoarthritis of hip: Secondary | ICD-10-CM | POA: Diagnosis not present

## 2019-03-05 DIAGNOSIS — M19011 Primary osteoarthritis, right shoulder: Secondary | ICD-10-CM | POA: Diagnosis not present

## 2019-03-08 ENCOUNTER — Ambulatory Visit: Payer: Disability Insurance | Admitting: Gastroenterology

## 2019-03-08 ENCOUNTER — Ambulatory Visit (INDEPENDENT_AMBULATORY_CARE_PROVIDER_SITE_OTHER): Payer: Medicare HMO | Admitting: Gastroenterology

## 2019-03-08 ENCOUNTER — Other Ambulatory Visit: Payer: Self-pay | Admitting: Family Medicine

## 2019-03-08 ENCOUNTER — Other Ambulatory Visit: Payer: Self-pay

## 2019-03-08 DIAGNOSIS — E559 Vitamin D deficiency, unspecified: Secondary | ICD-10-CM | POA: Diagnosis not present

## 2019-03-08 DIAGNOSIS — I2510 Atherosclerotic heart disease of native coronary artery without angina pectoris: Secondary | ICD-10-CM | POA: Diagnosis not present

## 2019-03-08 DIAGNOSIS — M160 Bilateral primary osteoarthritis of hip: Secondary | ICD-10-CM | POA: Diagnosis not present

## 2019-03-08 DIAGNOSIS — S72145D Nondisplaced intertrochanteric fracture of left femur, subsequent encounter for closed fracture with routine healing: Secondary | ICD-10-CM | POA: Diagnosis not present

## 2019-03-08 DIAGNOSIS — F32A Depression, unspecified: Secondary | ICD-10-CM

## 2019-03-08 DIAGNOSIS — K50012 Crohn's disease of small intestine with intestinal obstruction: Secondary | ICD-10-CM

## 2019-03-08 DIAGNOSIS — M19011 Primary osteoarthritis, right shoulder: Secondary | ICD-10-CM | POA: Diagnosis not present

## 2019-03-08 DIAGNOSIS — D518 Other vitamin B12 deficiency anemias: Secondary | ICD-10-CM | POA: Diagnosis not present

## 2019-03-08 DIAGNOSIS — M19012 Primary osteoarthritis, left shoulder: Secondary | ICD-10-CM | POA: Diagnosis not present

## 2019-03-08 DIAGNOSIS — D511 Vitamin B12 deficiency anemia due to selective vitamin B12 malabsorption with proteinuria: Secondary | ICD-10-CM | POA: Diagnosis not present

## 2019-03-08 DIAGNOSIS — M818 Other osteoporosis without current pathological fracture: Secondary | ICD-10-CM

## 2019-03-08 DIAGNOSIS — S72141D Displaced intertrochanteric fracture of right femur, subsequent encounter for closed fracture with routine healing: Secondary | ICD-10-CM | POA: Diagnosis not present

## 2019-03-08 DIAGNOSIS — K5090 Crohn's disease, unspecified, without complications: Secondary | ICD-10-CM | POA: Diagnosis not present

## 2019-03-08 DIAGNOSIS — M170 Bilateral primary osteoarthritis of knee: Secondary | ICD-10-CM | POA: Diagnosis not present

## 2019-03-08 DIAGNOSIS — F329 Major depressive disorder, single episode, unspecified: Secondary | ICD-10-CM

## 2019-03-08 DIAGNOSIS — F419 Anxiety disorder, unspecified: Secondary | ICD-10-CM

## 2019-03-08 DIAGNOSIS — D510 Vitamin B12 deficiency anemia due to intrinsic factor deficiency: Secondary | ICD-10-CM | POA: Diagnosis not present

## 2019-03-08 MED ORDER — BUDESONIDE 3 MG PO CPEP: 9.0000 mg | ORAL_CAPSULE | Freq: Every day | ORAL | 1 refills | Status: AC

## 2019-03-08 MED ORDER — CYANOCOBALAMIN 1000 MCG/ML IJ SOLN: 1000.0000 ug | INTRAMUSCULAR | 0 refills | Status: AC

## 2019-03-08 NOTE — Progress Notes (Signed)
Sherri Sear, MD [address]  [place]  [place], [place] [address]  Main: [phone]  Fax: [phone]    Gastroenterology Consultation Tele Visit  Referring Provider:     Birdie Sons, MD Primary Care Physician:  Birdie Sons, MD Primary Gastroenterologist:  Dr. Cephas Darby Reason for Consultation:     Small bowel crohn's        HPI:   Kimberly Hudson is a 63 y.o. female referred by Dr. Birdie Sons, MD  for consultation & management of small bowel crohn's  Virtual Visit via Telephone Note  I connected with Kimberly Hudson on 03/08/19 at  8:30 AM EDT by telephone and verified that I am speaking with the correct person using two identifiers.   I discussed the limitations, risks, security and privacy concerns of performing an evaluation and management service by telephone and the availability of in person appointments. I also discussed with the patient that there may be a patient responsible charge related to this service. The patient expressed understanding and agreed to proceed.  Location of the Patient: Home  Location of the provider: Home office   History of Present Illness: I initially met Kimberly Hudson at [facility] in mid 01/2019 for management of Crohn's disease after a fall when she presented with right intertrochanteric hip fracture, underwent ORIF on 01/28/2019 With regards to her Crohn's disease, patient reports that she is diagnosed about 30 years ago, was told that her disease is localized to terminal ileum only.  She has been on prednisone for several years, later tried various mesalamine formulations including Dipentum and Lialda with only partial response in her symptoms.  Later, she was started on azathioprine 150 mg daily which she has been on for at least 5 years started by her previous GI.  She has been seeing Dr. Allen Norris primarily for dysphagia since 2016 secondary to esophageal stricture underwent EGDs in the past with dilations.  Her last EGD was in  05/2017.  She told me that Dr. Allen Norris referred her to me for management of Crohn's about a year ago, however due to death in her family, she was not able to keep that appointment.  She saw Dr. Allen Norris about 6 months ago.  She is also diagnosed with osteoporosis by Dr. Allen Norris in 10/2018.  She is just taking calcium.  She does not know her vitamin D levels.  She is not on bisphosphonates either.  She was told to follow-up with her primary care provider with regards to osteoporosis.  Patient reports that she has been dealing with symptoms related to Crohn's disease since her diagnosis, include postprandial urgency, several episodes of nonbloody mushy stools associated with intermittent episodes of nausea and vomiting, significant abdominal bloating.  Denies nocturnal diarrhea, denies perianal lesions, hematochezia, rectal bleeding, fever, chills.  She does report that sometimes she feels that she may have blockage in her small intestine.  She does have chronic iron deficiency anemia as well as B12 deficiency.  She was taking B12 injections in the past.  She denies having surgeries related to her Crohn's disease  During hospital stay at [facility], patient underwent CT enterography which revealed multiple moderately dilated loops in the distal jejunum and ileum throughout the abdomen with intervening strictured segments, longest stricture measuring 6 cm in length as well as mild wall thickening and mild mucosal hyperenhancement without significant surrounding inflammatory fat stranding or mesenteric edema.  Terminal ileum was also mildly inflamed.  Therefore, I started  her on budesonide 9 mg daily along with rifaximin.  I did not put her on prednisone due to severe osteoporosis.  She also had severe B12 deficiency  Patient is currently recovering from hip fracture, undergoing PT services at home and tolerating it well.  She reports that she has been able to walk in the house using a walker.  She is not bearing weight on her right  leg.  She has been followed by orthopedics since discharge.  She completed 1 month course of budesonide.  She is currently taking rifaximin twice daily.  She noticed that the abdominal pain has improved on budesonide.  However, she continues to have intermittent episodes of watery diarrhea and sometimes leakage of mucus and liquid brown stool as well.  She reports her appetite is good and has been eating well.  NSAIDs: None  Antiplts/Anticoagulants/Anti thrombotics:  GI Procedures:   Colonoscopy in 04/2013, report not available Several EGDs in the past with esophageal stricture and dilation, Candida esophagitis  Past Medical History:  Diagnosis Date  . Anemia    past hx  . Anxiety   . Arthritis    osteo - shoulders, hips, knees  . Complication of anesthesia    propofol causes headaches  . Coronary artery disease   . Crohn's disease ([place])   . Depression   . GERD (gastroesophageal reflux disease)   . History of chicken pox   . History of measles   . History of mumps   . Hypertension   . 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 Wears dentures    full upper    Past Surgical History:  Procedure Laterality Date  . COLONOSCOPY  02/2013   No polyps were found  . ESOPHAGEAL DILATION  05/29/2017   Procedure: ESOPHAGEAL DILATION;  Surgeon: Lucilla Lame, MD;  Location: [place];  Service: Endoscopy;;  . ESOPHAGOGASTRODUODENOSCOPY (EGD) WITH PROPOFOL N/A 06/28/2015   Procedure: ESOPHAGOGASTRODUODENOSCOPY (EGD) WITH PROPOFOL;  Surgeon: Lucilla Lame, MD;  Location: [place];  Service: Endoscopy;  Laterality: N/A;  with dialation  . ESOPHAGOGASTRODUODENOSCOPY (EGD) WITH PROPOFOL N/A 07/26/2015   Procedure: ESOPHAGOGASTRODUODENOSCOPY (EGD) WITH PROPOFOL, with dialation;  Surgeon: Lucilla Lame, MD;  Location: [place];  Service: Endoscopy;  Laterality: N/A;  LEAVE PT LAST  . ESOPHAGOGASTRODUODENOSCOPY (EGD) WITH PROPOFOL N/A 08/05/2016   Procedure:  ESOPHAGOGASTRODUODENOSCOPY (EGD) WITH PROPOFOL;  Surgeon: Lucilla Lame, MD;  Location: [place];  Service: Endoscopy;  Laterality: N/A;  . ESOPHAGOGASTRODUODENOSCOPY (EGD) WITH PROPOFOL N/A 09/26/2016   Procedure: ESOPHAGOGASTRODUODENOSCOPY (EGD) WITH PROPOFOL;  Surgeon: Lucilla Lame, MD;  Location: [place];  Service: Endoscopy;  Laterality: N/A;  . ESOPHAGOGASTRODUODENOSCOPY (EGD) WITH PROPOFOL N/A 05/29/2017   Procedure: ESOPHAGOGASTRODUODENOSCOPY (EGD) WITH PROPOFOL - Injection of steroid in esophagus;  Surgeon: Lucilla Lame, MD;  Location: [place];  Service: Endoscopy;  Laterality: N/A;  . Excision BCC  07/28/2013   right nares, Georgiann Cocker  . INTRAMEDULLARY (IM) NAIL INTERTROCHANTERIC Right 01/28/2019   Procedure: INTRAMEDULLARY (IM) NAIL INTERTROCHANTRIC;  Surgeon: Leim Fabry, MD;  Location: [facility];  Service: Orthopedics;  Laterality: Right;  . TUBAL LIGATION  1986  . UPPER GI ENDOSCOPY  02/2014    Current Outpatient Medications:  .  Amino Acids-Protein Hydrolys (FEEDING SUPPLEMENT, PRO-STAT SUGAR FREE 64,) LIQD, Take 30 mLs by mouth 2 (two) times daily., Disp: 60 Bottle, Rfl: 0 .  budesonide (ENTOCORT EC) 3 MG 24 hr capsule, Take 3 capsules (9 mg total) by mouth daily., Disp: 270 capsule, Rfl:  1 .  clonazePAM (KLONOPIN) 1 MG tablet, Take 1 tablet (1 mg total) by mouth at bedtime., Disp: 30 tablet, Rfl: 3 .  cyanocobalamin (,VITAMIN B-12,) 1000 MCG/ML injection, Inject 1 mL (1,000 mcg total) into the skin once a week for 4 doses., Disp: 1 mL, Rfl: 0 .  cyclobenzaprine (FLEXERIL) 5 MG tablet, TAKE 1 TABLET BY MOUTH THREE TIMES A DAY AS NEEDED FOR MUSCLE SPASMS, Disp: 90 tablet, Rfl: 5 .  dicyclomine (BENTYL) 20 MG tablet, TAKE 1 TABLET (20 MG TOTAL) BY MOUTH 3 (THREE) TIMES DAILY BEFORE MEALS., Disp: 90 tablet, Rfl: 5 .  diphenoxylate-atropine (LOMOTIL) 2.5-0.025 MG tablet, Take 1 tablet by mouth 4 (four) times daily as needed for diarrhea or loose  stools., Disp: 120 tablet, Rfl: 5 .  FLUoxetine (PROZAC) 40 MG capsule, Take 2 capsules (80 mg total) by mouth daily., Disp: , Rfl:  .  fluticasone (FLONASE) 50 MCG/ACT nasal spray, Place 2 sprays into both nostrils daily., Disp: 16 g, Rfl: 6 .  HYDROcodone-acetaminophen (NORCO) 7.5-325 MG tablet, Take 1 tablet by mouth every 6 (six) hours as needed for moderate pain., Disp: 120 tablet, Rfl: 0 .  pantoprazole (PROTONIX) 40 MG tablet, TAKE 1 TABLET BY MOUTH TWICE A DAY, Disp: 60 tablet, Rfl: 6 .  pravastatin (PRAVACHOL) 10 MG tablet, TAKE 1 TABLET BY MOUTH EVERY DAY, Disp: 90 tablet, Rfl: 4 .  promethazine (PHENERGAN) 25 MG tablet, TAKE 1 TABLET BY MOUTH EVERY 6 HOURS AS NEEDED FOR NAUSEA AND VOMITING, Disp: 30 tablet, Rfl: 3 .  rifaximin (XIFAXAN) 550 MG TABS tablet, Take 1 tablet (550 mg total) by mouth 2 (two) times daily., Disp: 60 tablet, Rfl: 0 .  rOPINIRole (REQUIP) 0.5 MG tablet, TAKE 1/2 TAB AT BEDTIME X3 DAYS, THEN 1 AT BEDTIME X3 DAYS, THEN INCREASE TO 2 AT BEDTIME IF NEEDED., Disp: 30 tablet, Rfl: 11 .  SYMBICORT 80-4.5 MCG/ACT inhaler, TAKE 2 PUFFS BY MOUTH TWICE A DAY, Disp: 10.2 Inhaler, Rfl: 11 .  traMADol (ULTRAM) 50 MG tablet, Take 1 tablet (50 mg total) by mouth every 6 (six) hours as needed for moderate pain., Disp: 30 tablet, Rfl: 1 .  Vitamin D, Ergocalciferol, (DRISDOL) 1.25 MG (50000 UT) CAPS capsule, Take 1 capsule (50,000 Units total) by mouth every 7 (seven) days for 20 doses., Disp: 20 capsule, Rfl: 0 .  enoxaparin (LOVENOX) 40 MG/0.4ML injection, Inject 0.4 mLs (40 mg total) into the skin daily. (Patient not taking: Reported on 03/08/2019), Disp: 28 Syringe, Rfl: 0 .  Multiple Vitamin (MULTIVITAMIN WITH MINERALS) TABS tablet, Take 1 tablet by mouth daily. (Patient not taking: Reported on 03/08/2019), Disp: 30 tablet, Rfl: 0   Family History  Problem Relation Age of Onset  . COPD Mother   . Heart disease Mother   . COPD Father   . Emphysema Father   . Prostate cancer  Father   . Alcohol abuse Father   . Anxiety disorder Father   . Depression Father   . Schizophrenia Father   . COPD Sister   . Heart disease Sister   . Alcohol abuse Sister   . Anxiety disorder Sister   . Depression Sister   . Alcohol abuse Brother   . Alcohol abuse Sister   . Anxiety disorder Sister   . Depression Sister   . Alcohol abuse Sister   . Anxiety disorder Sister   . Depression Sister   . Anxiety disorder Sister   . Depression Sister   . Anxiety disorder Sister   .  Depression Sister   . Drug abuse Son   . Drug abuse Other   . Drug abuse Other   . Drug abuse Other      Social History   Tobacco Use  . Smoking status: Former Smoker    Packs/day: 1.00    Years: 10.00    Pack years: 10.00    Types: Cigarettes    Last attempt to quit: 11/18/2012    Years since quitting: 6.3  . Smokeless tobacco: Never Used  . Tobacco comment: smoked up to 1 ppd for about 10 years.   Substance Use Topics  . Alcohol use: Not Currently    Alcohol/week: 0.0 standard drinks    Comment: every once in a while  . Drug use: No    Allergies as of 03/08/2019 - Review Complete 03/08/2019  Allergen Reaction Noted  . Nsaids Other (See Comments) 04/21/2014  . Sulfa antibiotics Rash 04/21/2014     Imaging Studies: Reviewed  Assessment and Plan:   ELIZA GREEN is a 63 y.o. Caucasian female with extensive small bowel Crohn's disease, stricturing phenotype, long-term prednisone use followed by mesalamine and azathioprine, uncontrolled, iron and B12 deficiency anemia, steroid-induced obesity, osteoporosis complicated by right intertrochanteric fracture s/p ORIF  Small bowel Crohn's: She has several strictures both inflammatory and fibrostenotic type in her small intestine, longest stricture of 6 cm in length in the distal jejunum, proximal ileum, and she also has upstream dilations.  The longest stricture is probably fibro-stenotic which is unlikely going to be resolved with medical  management only.  She will need surgical intervention for this stricture.  Given recent hip fracture and as patient is currently recovering from it, she does not want to pursue any surgery at this time which I can understand.  In the meantime, patient will need more intense medical therapy which include immunosuppressive therapy such as anti-TNF or ustekinumab with/without combination of methotrexate or azathioprine. However, She has history of basal cell carcinoma x 4, most recently resected in 10/2018, closely followed by dermatology every 3 months. She also has h/o cervical dysplasia based on path in 2015. Unclear of current status Continue budesonide 3 mg 3 pills daily Continue rifaximin 550 mg twice daily Hold azathioprine, normal TPMT levels  IBD Health Maintenance  1.TB status: Check QuantiFERON gold  2. Anemia: Severe B12 deficiency anemia, continue B12 1000 MCG subcutaneous weekly injections, ferritin normal.  Recheck CBC, B12, folate and iron studies 3.Immunizations: Hep A and B serologies negative, recommend Twinrix vaccine, Influenza up-to-date, prevnar recommend, pneumovax recommend in 2 to 12 months after Prevnar, received Shingrix in 2018 x2 4.Cancer screening I) Colon cancer/dysplasia surveillance: Recommend colonoscopy after she recovers from hip fracture II) Cervical cancer: h/o cervical dysplasia based on path in 2015. Unclear of current status. Will f/u with her PCP and Gyn III) Skin cancer - h/o basal cell carcinoma of the nose and extremities. Recommend close follow up with dermatology every 3 months.  Also counseled her about skin protection in summer using sun screen SPF > 50, clothing 5.Bone health Vitamin D status: Severe vitamin D deficiency, continue vitamin D 50,000 units weekly long-term Bone density testing: DEXA scan revealed severe osteoporosis Likely combination of long-term steroid use and malabsorption from uncontrolled Crohn's Complicated by mechanical fall  resulting in hip fracture Recommend urgent referral to endocrinology for bisphosphonate 5. Labs: Today 6. Smoking: Does not smoke 7. NSAIDs and Antibiotics use: None    Follow Up Instructions:   I discussed the assessment and treatment  plan with the patient. The patient was provided an opportunity to ask questions and all were answered. The patient agreed with the plan and demonstrated an understanding of the instructions.   The patient was advised to call back or seek an in-person evaluation if the symptoms worsen or if the condition fails to improve as anticipated.  I provided 30 minutes of non-face-to-face time during this encounter.   Follow up in 4weeks   Cephas Darby, MD

## 2019-03-09 ENCOUNTER — Telehealth: Payer: Self-pay | Admitting: Gastroenterology

## 2019-03-09 NOTE — Telephone Encounter (Signed)
Patient called stating her cyanocobalamin (,VITAMIN B-12,) 1000 MCG/ML injection & budesonide (ENTOCORT EC) 3 MG 24 hr capsule was not called into [facility] [address].Please call the patient once it's done.

## 2019-03-10 ENCOUNTER — Other Ambulatory Visit: Payer: Self-pay

## 2019-03-10 DIAGNOSIS — E559 Vitamin D deficiency, unspecified: Secondary | ICD-10-CM | POA: Diagnosis not present

## 2019-03-10 DIAGNOSIS — M160 Bilateral primary osteoarthritis of hip: Secondary | ICD-10-CM | POA: Diagnosis not present

## 2019-03-10 DIAGNOSIS — D510 Vitamin B12 deficiency anemia due to intrinsic factor deficiency: Secondary | ICD-10-CM | POA: Diagnosis not present

## 2019-03-10 DIAGNOSIS — M19011 Primary osteoarthritis, right shoulder: Secondary | ICD-10-CM | POA: Diagnosis not present

## 2019-03-10 DIAGNOSIS — K5090 Crohn's disease, unspecified, without complications: Secondary | ICD-10-CM | POA: Diagnosis not present

## 2019-03-10 DIAGNOSIS — S72141D Displaced intertrochanteric fracture of right femur, subsequent encounter for closed fracture with routine healing: Secondary | ICD-10-CM | POA: Diagnosis not present

## 2019-03-10 DIAGNOSIS — I2510 Atherosclerotic heart disease of native coronary artery without angina pectoris: Secondary | ICD-10-CM | POA: Diagnosis not present

## 2019-03-10 DIAGNOSIS — M25551 Pain in right hip: Secondary | ICD-10-CM | POA: Diagnosis not present

## 2019-03-10 DIAGNOSIS — K50012 Crohn's disease of small intestine with intestinal obstruction: Secondary | ICD-10-CM

## 2019-03-10 DIAGNOSIS — M19012 Primary osteoarthritis, left shoulder: Secondary | ICD-10-CM | POA: Diagnosis not present

## 2019-03-10 DIAGNOSIS — S72145D Nondisplaced intertrochanteric fracture of left femur, subsequent encounter for closed fracture with routine healing: Secondary | ICD-10-CM | POA: Diagnosis not present

## 2019-03-10 DIAGNOSIS — D518 Other vitamin B12 deficiency anemias: Secondary | ICD-10-CM

## 2019-03-10 DIAGNOSIS — M170 Bilateral primary osteoarthritis of knee: Secondary | ICD-10-CM | POA: Diagnosis not present

## 2019-03-10 NOTE — Telephone Encounter (Signed)
Patient called to her pharmacy & the medication  Is not ready since Monday. Please call patient once called in.

## 2019-03-10 NOTE — Telephone Encounter (Signed)
Medication has been called into pharmacy today due to medication was not electronically sent to pharmacy it printed in office and due to staff not in office dundie to Covid 19 we did not realized, so medication is ready at pharmacy, pt has been notified and verbalized understanding

## 2019-03-15 ENCOUNTER — Telehealth: Payer: Self-pay

## 2019-03-15 DIAGNOSIS — R1111 Vomiting without nausea: Secondary | ICD-10-CM | POA: Diagnosis not present

## 2019-03-15 DIAGNOSIS — R000 Tachycardia, unspecified: Secondary | ICD-10-CM | POA: Diagnosis not present

## 2019-03-15 DIAGNOSIS — R404 Transient alteration of awareness: Secondary | ICD-10-CM | POA: Diagnosis not present

## 2019-03-17 NOTE — Telephone Encounter (Signed)
Dr. Caryn Section is out of the office until Friday 03/19/2019. Please review. Copy of Death Certificate given to Dr. Alben Spittle team to review.

## 2019-03-17 NOTE — Telephone Encounter (Signed)
[facility] called stating pt's daughter has been calling them for death certificate. Pt's daughter is wanting to know what Dr. Caryn Section will put on death certificate for cause of death. Daughter is requesting we call her. Daughter is Kimberly Hudson is in chart. Please advise?

## 2019-03-18 NOTE — Telephone Encounter (Signed)
Per Rosanna Randy, Dr Caryn Section is coming in today to take care of this.  dbs

## 2019-03-19 DIAGNOSIS — 419620001 Death: Secondary | SNOMED CT | POA: Diagnosis not present

## 2019-03-19 NOTE — Telephone Encounter (Signed)
Please review. Thanks!  

## 2019-03-19 NOTE — Telephone Encounter (Signed)
Pt's daughter Colletta [place] called to report her mother pasted away this morning.  Pt is requesting an autopsy done but she was told it wasn't needed.  She would like to have this done.  Please advise.   Contact Stephanie at [phone]  Thanks,   -Mickel Baas

## 2019-03-19 DEATH — deceased

## 2019-04-07 ENCOUNTER — Ambulatory Visit: Payer: Disability Insurance | Admitting: Gastroenterology

## 2019-06-22 ENCOUNTER — Telehealth: Payer: Self-pay | Admitting: Family Medicine

## 2019-06-22 NOTE — Telephone Encounter (Signed)
Pt's daughter is wanting to know if there is anything else she needs to do to help release the records to the insurance [place].  Please call Bernita Buffy back at  [phone].  Thanks, [employer]

## 2019-06-24 NOTE — Telephone Encounter (Signed)
Spoke with Ms. Tripp and advised her that is takes some time to get pt's medical records released.     Thanks,   -Mickel Baas

## 2019-07-16 ENCOUNTER — Telehealth: Payer: Self-pay

## 2019-07-16 NOTE — Telephone Encounter (Signed)
Encounter error

## 2020-06-18 IMAGING — CT CT ABDOMEN AND PELVIS WITH CONTRAST (ENTEROGRAPHY)
2 of 6 series · 15 of 46 positions shown, 17 images · IV contrast (APPLIED)
Comparison: 01/27/2019 pelvic radiograph.

CLINICAL DATA: Inpatient. Episodes of nausea, bloating and
diarrhea. Reported history of poorly controlled Crohn disease.
Recent right hip fracture.

EXAM:
CT ABDOMEN AND PELVIS WITH CONTRAST (ENTEROGRAPHY)
TECHNIQUE: Multidetector CT of the abdomen and pelvis during bolus
administration of intravenous contrast. Negative oral contrast was
given.
CONTRAST:  100mL OMNIPAQUE IOHEXOL 300 MG/ML  SOLN

[Series 3: entero thins · axial · 0.77mm/px · z∈[-504,-6]mm · 12 of 281 slices shown, 14 images]
[im 16/281  soft-tissue]
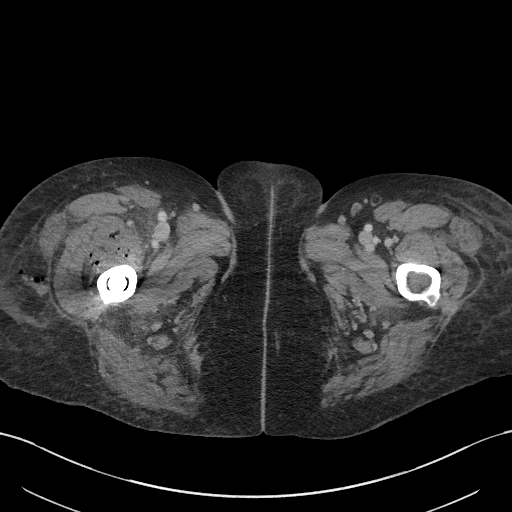
[im 16/281  bone]
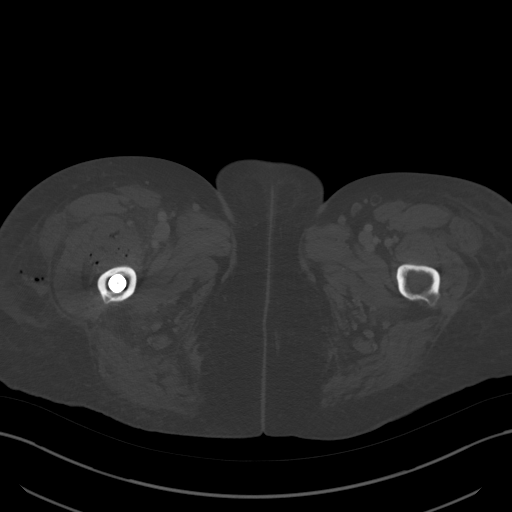
[im 47/281  soft-tissue]
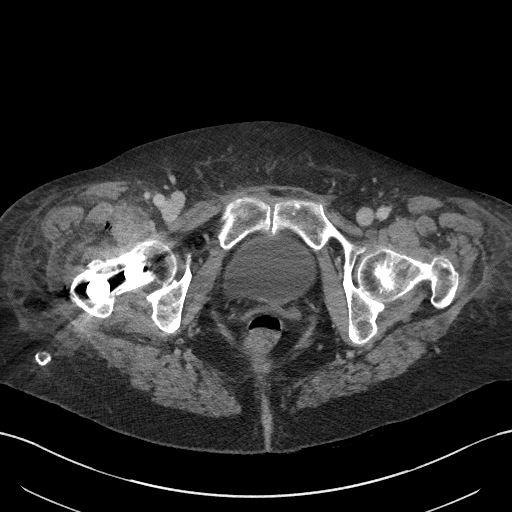
[im 63/281  soft-tissue]
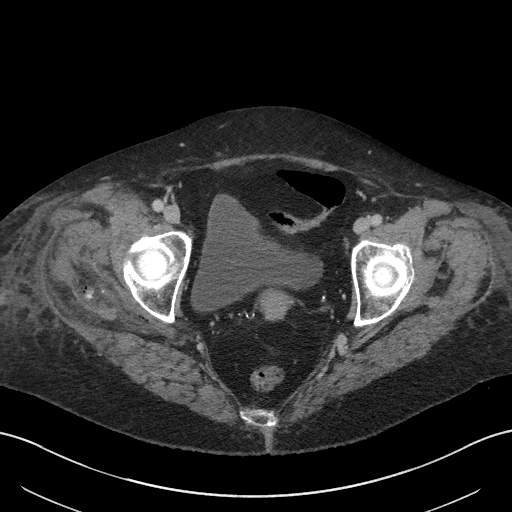
[im 78/281  soft-tissue]
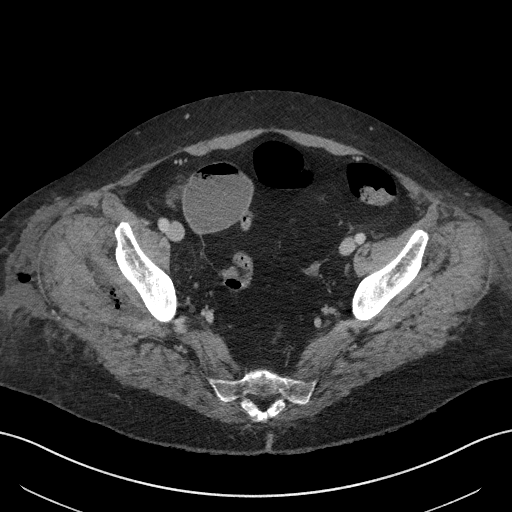
[im 109/281  soft-tissue]
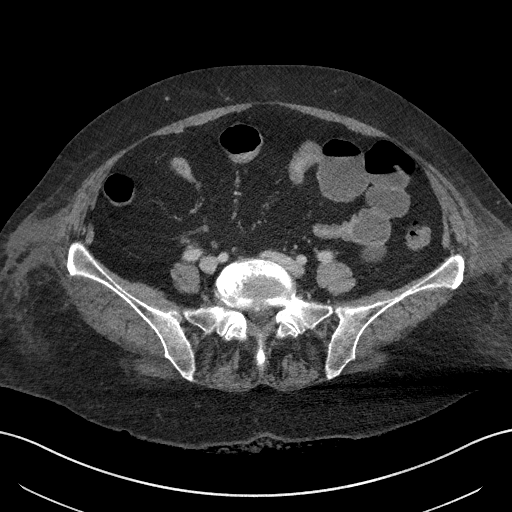
[im 125/281  soft-tissue]
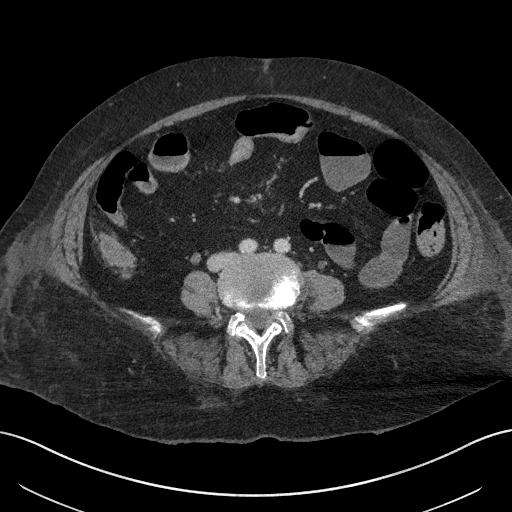
[im 156/281  soft-tissue]
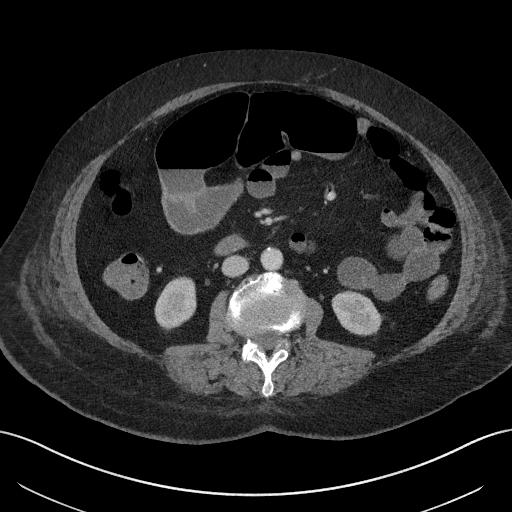
[im 172/281  soft-tissue]
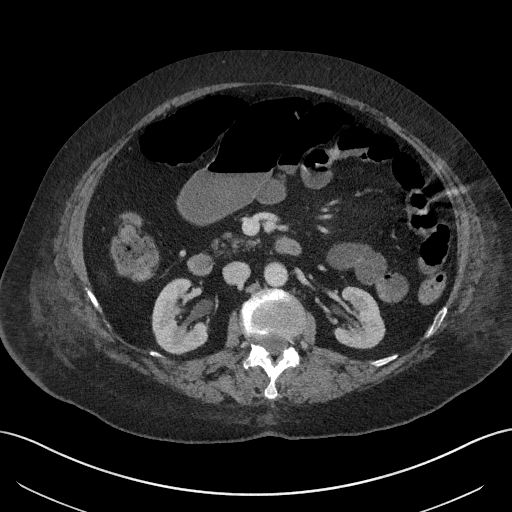
[im 203/281  soft-tissue]
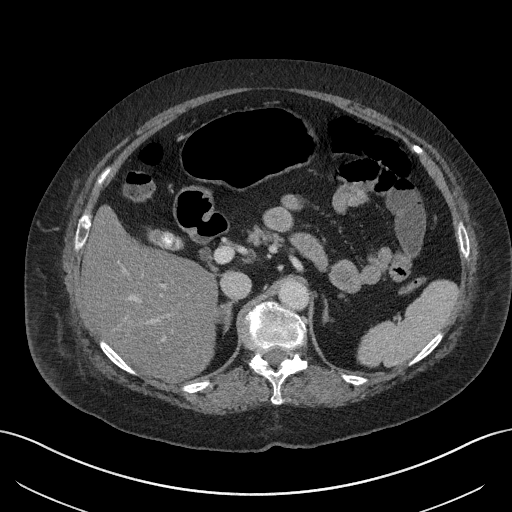
[im 203/281  bone]
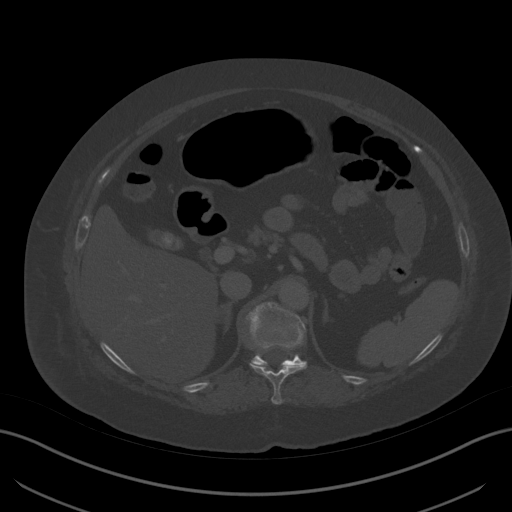
[im 218/281  soft-tissue]
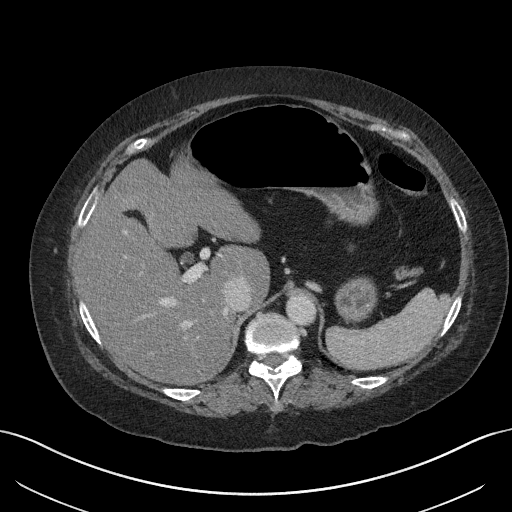
[im 234/281  soft-tissue]
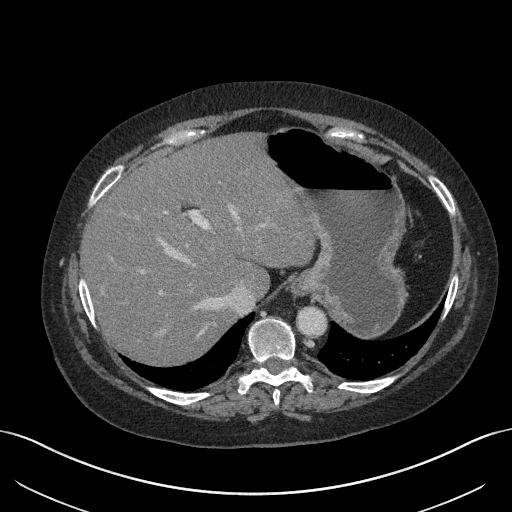
[im 265/281  soft-tissue]
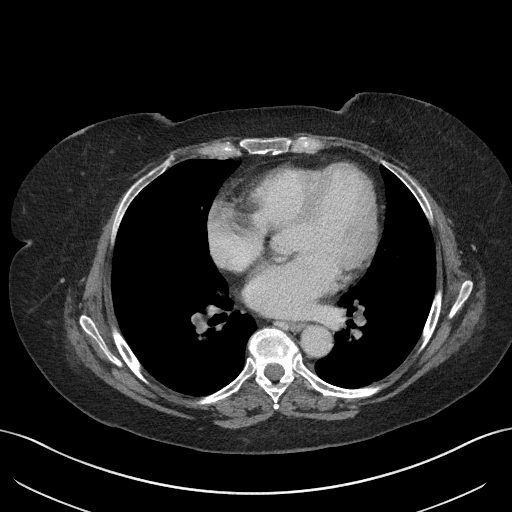

[Series 6: coronal · coronal · 0.94mm/px · 3 of 106 slices shown]
[im 36/106  soft-tissue]
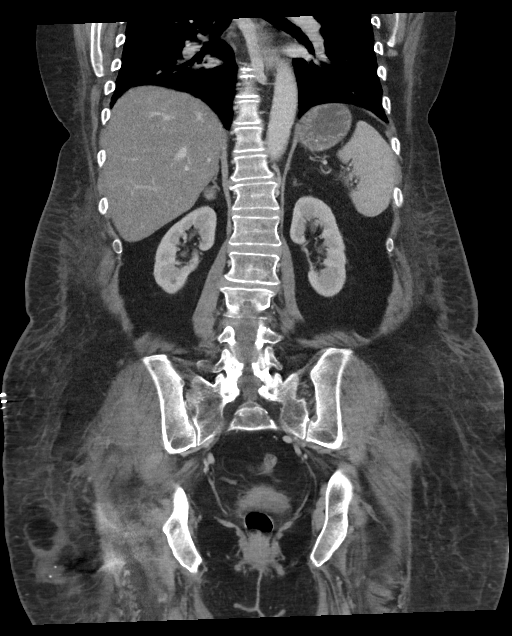
[im 47/106  soft-tissue]
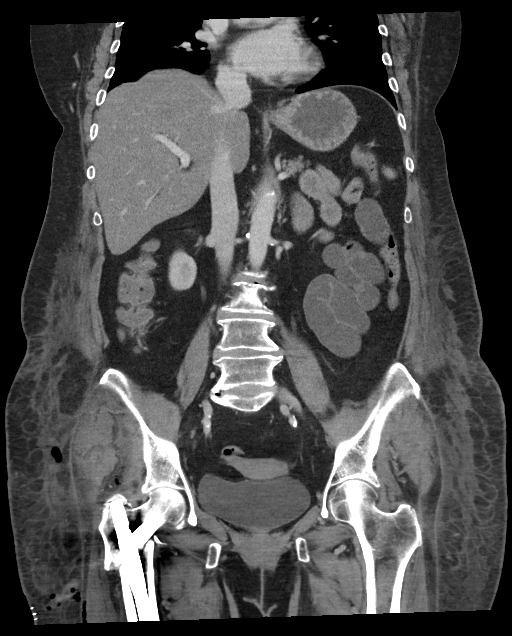
[im 59/106  soft-tissue]
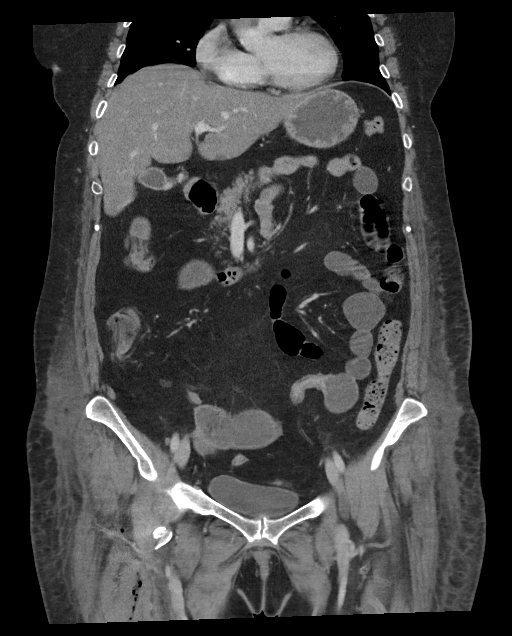

[15 of 46 positions shown; findings below may reference images not displayed]

FINDINGS: Lower chest: Minimal patchy tree-in-bud opacity in the right middle
lobe.

Hepatobiliary: Normal liver size. Granulomatous posterior right
liver lobe calcification. No liver mass. Cholelithiasis. No
gallbladder distention. No definite gallbladder wall thickening. No
pericholecystic fluid. No biliary ductal dilatation.

Pancreas: Normal, with no mass or duct dilation.

Spleen: Normal size. No mass.

Adrenals/Urinary Tract: Right adrenal 1.9 cm nodule with density 24
HU. Left adrenal 2.4 cm nodule with density 40 HU. Normal kidneys
with no hydronephrosis and no renal mass. Tiny foci of gas in the
nondependent bladder lumen, presumably due to recent
instrumentation. Otherwise normal bladder.

Stomach/Bowel: Normal stomach. There are multiple moderately dilated
loops distal jejunum and ileum throughout the abdomen with
intervening strictured segments of appearing length, longest
stricture measuring proximally 6 cm in length (series 3/image 172).
There is mild wall thickening and mild mucosal hyperenhancement at
the areas of stricturing without significant surrounding
inflammatory fat stranding or mesenteric edema. The terminal ileum
is relatively collapsed and demonstrates mild mucosal
hyperenhancement and fatty mural replacement. Normal appendix.
Relatively collapsed large bowel, with no large bowel wall
thickening, diverticulosis or pericolonic fat stranding. No discrete
bowel fistula.

Vascular/Lymphatic: Atherosclerotic nonaneurysmal abdominal aorta.
Patent portal, splenic, hepatic and renal veins. No pathologically
enlarged lymph nodes in the abdomen or pelvis.

Reproductive: Grossly normal uterus.  No adnexal mass.

Other: No pneumoperitoneum, ascites or focal fluid collection.

Musculoskeletal: No aggressive appearing focal osseous lesions.
Partially visualized expected postsurgical changes from ORIF of the
comminuted intertrochanteric proximal right femur fracture, which is
in near-anatomic alignment. Mild thoracolumbar spondylosis.
IMPRESSION: 1. CT enterography findings are compatible with chronic
fibrostenotic disease in the mid to distal small bowel with multiple
strictures and intervening dilated small bowel loops. No bowel
fistula or abscess.
2. Indeterminate bilateral adrenal nodules, presumably representing
adenomas assuming no history of malignancy. Suggests adrenal
protocol CT abdomen without and with IV contrast in 12 months. This
recommendation follows ACR consensus guidelines: Management of
Incidental Adrenal Masses: A White Paper of the ACR Incidental
Findings Committee. [HOSPITAL] 9956;14:1063-1088.
3. Minimal patchy tree-in-bud opacity in the right middle lobe
compatible with minimal bronchiolitis of uncertain clinical
significance.
4. Cholelithiasis.
5. Postsurgical changes from ORIF intertrochanteric right femur
fracture.
6. Tiny foci of gas in the nondependent bladder lumen, presumably
due to recent bladder instrumentation.
7.  Aortic Atherosclerosis ([id number]).
